# Patient Record
Sex: Male | Born: 1960 | Race: White | Hispanic: No | Marital: Single | Smoking: Former smoker
Health system: Southern US, Community
[De-identification: ages and names within clinical notes are randomized; demographics above are authoritative.]

## PROBLEM LIST (undated history)

## (undated) DIAGNOSIS — E119 Type 2 diabetes mellitus without complications: Secondary | ICD-10-CM

## (undated) DIAGNOSIS — J302 Other seasonal allergic rhinitis: Secondary | ICD-10-CM

## (undated) DIAGNOSIS — N201 Calculus of ureter: Secondary | ICD-10-CM

## (undated) DIAGNOSIS — Z87442 Personal history of urinary calculi: Secondary | ICD-10-CM

## (undated) DIAGNOSIS — K760 Fatty (change of) liver, not elsewhere classified: Secondary | ICD-10-CM

## (undated) DIAGNOSIS — E785 Hyperlipidemia, unspecified: Secondary | ICD-10-CM

## (undated) DIAGNOSIS — I10 Essential (primary) hypertension: Secondary | ICD-10-CM

## (undated) DIAGNOSIS — Z973 Presence of spectacles and contact lenses: Secondary | ICD-10-CM

## (undated) HISTORY — PX: CYSTOSCOPY WITH HOLMIUM LASER LITHOTRIPSY: SHX6639

## (undated) HISTORY — PX: OTHER SURGICAL HISTORY: SHX169

## (undated) HISTORY — DX: Personal history of urinary calculi: Z87.442

## (undated) HISTORY — PX: EXTRACORPOREAL SHOCK WAVE LITHOTRIPSY: SHX1557

## (undated) HISTORY — PX: TONSILLECTOMY: SUR1361

## (undated) HISTORY — DX: Hyperlipidemia, unspecified: E78.5

## (undated) HISTORY — PX: COLONOSCOPY WITH PROPOFOL: SHX5780

## (undated) HISTORY — DX: Other seasonal allergic rhinitis: J30.2

---

## 1997-11-22 ENCOUNTER — Ambulatory Visit (HOSPITAL_COMMUNITY): Admission: RE | Admit: 1997-11-22 | Discharge: 1997-11-22 | Payer: Self-pay | Admitting: Internal Medicine

## 1999-05-27 ENCOUNTER — Emergency Department (HOSPITAL_COMMUNITY): Admission: EM | Admit: 1999-05-27 | Discharge: 1999-05-27 | Payer: Self-pay | Admitting: Emergency Medicine

## 1999-05-27 ENCOUNTER — Encounter: Payer: Self-pay | Admitting: Emergency Medicine

## 2003-10-17 ENCOUNTER — Emergency Department (HOSPITAL_COMMUNITY): Admission: EM | Admit: 2003-10-17 | Discharge: 2003-10-17 | Payer: Self-pay | Admitting: Emergency Medicine

## 2003-10-20 ENCOUNTER — Ambulatory Visit (HOSPITAL_COMMUNITY): Admission: RE | Admit: 2003-10-20 | Discharge: 2003-10-20 | Payer: Self-pay | Admitting: Urology

## 2003-12-23 ENCOUNTER — Ambulatory Visit (HOSPITAL_COMMUNITY): Admission: RE | Admit: 2003-12-23 | Discharge: 2003-12-23 | Payer: Self-pay | Admitting: Urology

## 2003-12-23 ENCOUNTER — Ambulatory Visit (HOSPITAL_BASED_OUTPATIENT_CLINIC_OR_DEPARTMENT_OTHER): Admission: RE | Admit: 2003-12-23 | Discharge: 2003-12-23 | Payer: Self-pay | Admitting: Urology

## 2008-09-05 ENCOUNTER — Emergency Department (HOSPITAL_BASED_OUTPATIENT_CLINIC_OR_DEPARTMENT_OTHER): Admission: EM | Admit: 2008-09-05 | Discharge: 2008-09-05 | Payer: Self-pay | Admitting: Emergency Medicine

## 2009-05-01 ENCOUNTER — Ambulatory Visit: Payer: Self-pay | Admitting: Diagnostic Radiology

## 2009-05-01 ENCOUNTER — Emergency Department (HOSPITAL_BASED_OUTPATIENT_CLINIC_OR_DEPARTMENT_OTHER): Admission: EM | Admit: 2009-05-01 | Discharge: 2009-05-01 | Payer: Self-pay | Admitting: Emergency Medicine

## 2009-05-05 ENCOUNTER — Encounter: Admission: RE | Admit: 2009-05-05 | Discharge: 2009-05-05 | Payer: Self-pay | Admitting: Orthopedic Surgery

## 2009-08-17 ENCOUNTER — Encounter: Admission: RE | Admit: 2009-08-17 | Discharge: 2009-11-15 | Payer: Self-pay | Admitting: Orthopedic Surgery

## 2010-04-12 ENCOUNTER — Ambulatory Visit: Payer: Self-pay | Admitting: Diagnostic Radiology

## 2010-04-12 ENCOUNTER — Emergency Department (HOSPITAL_BASED_OUTPATIENT_CLINIC_OR_DEPARTMENT_OTHER): Admission: EM | Admit: 2010-04-12 | Discharge: 2010-04-12 | Payer: Self-pay | Admitting: Emergency Medicine

## 2010-08-14 LAB — URINE MICROSCOPIC-ADD ON

## 2010-08-14 LAB — URINALYSIS, ROUTINE W REFLEX MICROSCOPIC
Glucose, UA: NEGATIVE mg/dL
Nitrite: NEGATIVE
Protein, ur: 100 mg/dL — AB

## 2010-10-19 NOTE — Op Note (Signed)
NAME:  Kyle Mcclure, Kyle Mcclure                          ACCOUNT NO.:  [id number]   MEDICAL RECORD NO.:  [id number]                   PATIENT TYPE:  AMB   LOCATION:  [facility]                                 FACILITY:  [facility]   PHYSICIAN:  Mark C. Vernie Ammons, M.D.               DATE OF BIRTH:  10/24/1960   DATE OF PROCEDURE:  12/23/2003  DATE OF DISCHARGE:                                 OPERATIVE REPORT   PREOPERATIVE DIAGNOSES:  Left ureteral calculus.   POSTOPERATIVE DIAGNOSES:  Left ureteral calculus.   PROCEDURE:  Cystoscopy, left retrograde pyelogram with interpretation, left  ureteroscopy with laser in situ lithotripsy and stone extraction.   SURGEON:  Mark C. Vernie Ammons, M.D.   ANESTHESIA:  General.   ESTIMATED BLOOD LOSS:  Less than 5 mL.   DRAINS:  None.   SPECIMENS:  Stone to patient.   COMPLICATIONS:  None.   INDICATIONS FOR PROCEDURE:  The patient is a 50 -year-old white male who was  treated for left ureteral calculus with lithotripsy. The stone fragmented  but persisted but he had persistent fragments that failed to progress. He  was brought to the OR for ureteroscopic extraction of the stone fragments.  The risks, complications, alternatives and limitations were discussed with  the patient.   DESCRIPTION OF PROCEDURE:  After informed consent, the patient was brought  to the major OR, placed on the table, administered general anesthesia, moved  to the dorsal lithotomy position. His genitalia was sterilely prepped and  draped and a 21 French cystoscope was then passed per urethra into the  bladder. The bladder was fully inspected and noted to be free of any tumor,  stones or inflammatory lesions. The left ureteral orifice was identified and  a 0.038 inch floppy-tip guidewire was then passed up the left ureter under  fluoroscopic control.  I then removed the cystoscope leaving the guidewire  in place and inserted the 6 French rigid ureteroscope. I passed the  ureteroscope next to  the guidewire and up the left ureter for a short  distance and then injected contrast. The retrograde pyelogram revealed a  filling defect in the distal ureter consistent with the stone.  I therefore  advanced the ureteroscope further and was able to identify the stone.  It  was photographed. There appeared to be two stones present and the smallest  one was grasped with the nitinol basket and extracted without difficulty.  The larger one was felt too large to extract and therefore the holmium laser  fiber was then passed through the ureteroscope up to the level of the stone  and the stone was fragmented into two essentially equal size pieces. I then  used the nitinol basket to extract each of these. Finally the ureteroscope  was passed up the ureter. No further stone fragments were noted. There was  no injury to the ureter and due to the minimal amount of trauma  to the  ureter, I elected not to leave a ureteral stent. I then removed the  ureteroscope, reinserted the cystoscope in order to drain the bladder after  which the cystoscope was removed and  1% lidocaine jelly was then instilled in the urethra. A penile clamp was  applied and a B&O suppository given. The patient was awakened and taken to  the recovery room in stable satisfactory condition.   He will receive a prescription for 30 Vicodin ES and 30, 200 mg Pyridium and  followup in my office in two weeks.                                               Mark C. Vernie Ammons, M.D.    MCO/MEDQ  D:  12/23/2003  T:  12/24/2003  Job:  [id number]

## 2012-09-05 ENCOUNTER — Other Ambulatory Visit: Payer: Self-pay | Admitting: Internal Medicine

## 2012-09-05 ENCOUNTER — Other Ambulatory Visit (HOSPITAL_BASED_OUTPATIENT_CLINIC_OR_DEPARTMENT_OTHER): Payer: Self-pay | Admitting: Internal Medicine

## 2012-10-19 ENCOUNTER — Other Ambulatory Visit (HOSPITAL_BASED_OUTPATIENT_CLINIC_OR_DEPARTMENT_OTHER): Payer: Self-pay | Admitting: Internal Medicine

## 2012-10-29 ENCOUNTER — Emergency Department (HOSPITAL_BASED_OUTPATIENT_CLINIC_OR_DEPARTMENT_OTHER): Payer: BC Managed Care – PPO

## 2012-10-29 ENCOUNTER — Encounter (HOSPITAL_BASED_OUTPATIENT_CLINIC_OR_DEPARTMENT_OTHER): Payer: Self-pay | Admitting: Emergency Medicine

## 2012-10-29 ENCOUNTER — Emergency Department (HOSPITAL_BASED_OUTPATIENT_CLINIC_OR_DEPARTMENT_OTHER)
Admission: EM | Admit: 2012-10-29 | Discharge: 2012-10-29 | Disposition: A | Discharge status: Home / Self Care | Payer: BC Managed Care – PPO | Attending: Urology | Admitting: Urology

## 2012-10-29 ENCOUNTER — Other Ambulatory Visit: Payer: Self-pay | Admitting: Urology

## 2012-10-29 DIAGNOSIS — I10 Essential (primary) hypertension: Secondary | ICD-10-CM | POA: Insufficient documentation

## 2012-10-29 DIAGNOSIS — R112 Nausea with vomiting, unspecified: Secondary | ICD-10-CM | POA: Insufficient documentation

## 2012-10-29 DIAGNOSIS — Z79899 Other long term (current) drug therapy: Secondary | ICD-10-CM | POA: Insufficient documentation

## 2012-10-29 DIAGNOSIS — E119 Type 2 diabetes mellitus without complications: Secondary | ICD-10-CM | POA: Insufficient documentation

## 2012-10-29 DIAGNOSIS — N200 Calculus of kidney: Secondary | ICD-10-CM

## 2012-10-29 HISTORY — DX: Type 2 diabetes mellitus without complications: E11.9

## 2012-10-29 HISTORY — DX: Essential (primary) hypertension: I10

## 2012-10-29 LAB — URINALYSIS, ROUTINE W REFLEX MICROSCOPIC
Glucose, UA: >1000 mg/dL — AB
Ketones, ur: 15 mg/dL — AB
Nitrite: NEGATIVE
Urobilinogen, UA: 0.2 mg/dL (ref 0.0–1.0)

## 2012-10-29 LAB — URINE MICROSCOPIC-ADD ON

## 2012-10-29 LAB — BASIC METABOLIC PANEL
CO2: 23 mEq/L (ref 19–32)
Calcium: 9.8 mg/dL (ref 8.4–10.5)
Creatinine, Ser: 1 mg/dL (ref 0.50–1.35)
GFR calc non Af Amer: 85 mL/min — ABNORMAL LOW (ref >90)
Sodium: 134 mEq/L — ABNORMAL LOW (ref 135–145)

## 2012-10-29 LAB — CBC WITH DIFFERENTIAL/PLATELET
Basophils Absolute: 0.1 10*3/uL (ref 0.0–0.1)
Basophils Relative: 1 % (ref 0–1)
Hemoglobin: 16.4 g/dL (ref 13.0–17.0)
Lymphocytes Relative: 13 % (ref 12–46)
Lymphs Abs: 1.6 10*3/uL (ref 0.7–4.0)
MCHC: 37 g/dL — ABNORMAL HIGH (ref 30.0–36.0)
MCV: 85.9 fL (ref 78.0–100.0)
Monocytes Relative: 5 % (ref 3–12)
RBC: 5.16 MIL/uL (ref 4.22–5.81)
WBC: 12.2 10*3/uL — ABNORMAL HIGH (ref 4.0–10.5)

## 2012-10-29 MED ORDER — KETOROLAC TROMETHAMINE 30 MG/ML IJ SOLN: 30.0000 mg | Freq: Once | INTRAMUSCULAR | Status: DC

## 2012-10-29 MED ORDER — ONDANSETRON HCL 4 MG/2ML IJ SOLN
4.0000 mg | Freq: Once | INTRAMUSCULAR | Status: AC
  Administered 2012-10-29: 4 mg via INTRAVENOUS
  Filled 2012-10-29: qty 2

## 2012-10-29 MED ORDER — ONDANSETRON 8 MG PO TBDP: ORAL_TABLET | ORAL | Status: DC

## 2012-10-29 MED ORDER — KETOROLAC TROMETHAMINE 30 MG/ML IJ SOLN
30.0000 mg | Freq: Once | INTRAMUSCULAR | Status: AC
  Administered 2012-10-29: 30 mg via INTRAVENOUS
  Filled 2012-10-29: qty 1

## 2012-10-29 MED ORDER — TAMSULOSIN HCL 0.4 MG PO CAPS: 0.4000 mg | ORAL_CAPSULE | Freq: Every day | ORAL | Status: DC

## 2012-10-29 MED ORDER — SODIUM CHLORIDE 0.9 % IV BOLUS (SEPSIS)
500.0000 mL | Freq: Once | INTRAVENOUS | Status: AC
  Administered 2012-10-29: 500 mL via INTRAVENOUS

## 2012-10-29 MED ORDER — MORPHINE SULFATE 4 MG/ML IJ SOLN
4.0000 mg | Freq: Once | INTRAMUSCULAR | Status: AC
  Administered 2012-10-29: 4 mg via INTRAVENOUS
  Filled 2012-10-29: qty 1

## 2012-10-29 MED ORDER — HYDROMORPHONE HCL 2 MG PO TABS: 2.0000 mg | ORAL_TABLET | Freq: Four times a day (QID) | ORAL | Status: DC | PRN

## 2012-10-29 MED ORDER — IBUPROFEN 800 MG PO TABS: 800.0000 mg | ORAL_TABLET | Freq: Three times a day (TID) | ORAL | Status: DC

## 2012-10-29 NOTE — ED Notes (Signed)
Pt c/o right flank pain and right testicular pain. Pt has hx of kidney stones. Pt states he vomited x 5 at home.

## 2012-10-29 NOTE — ED Notes (Signed)
HYQ:[id number]<IO> Expected date:<BR> Expected time:<BR> Means of arrival:<BR> Comments:<BR> Transfer from [facility] - Kidney Stone, Obstructing

## 2012-10-29 NOTE — ED Notes (Signed)
Urologist ar bedside.

## 2012-10-29 NOTE — Consult Note (Signed)
Urology Consult  Referring physician: Dr Dalhstedt[facility]/ Dr Terressa Koyanagi Reason for referral:Ureteral stone  Chief Complaint: Ureteral stone  History of Present Illness: Recurrent rt flank pain; found on CT to have moderate Rt hydro and a 1.3 x 0.9 cm stone in proximal rt ureter 6 cm below renal pelvis; non-osbt stones rt kidney also; has passed large stone before: ESWL Dr Vernie Ammons;  No fever; n/v has settoed; now pain free Modifying factors: There are no other modifying factors  Associated signs and symptoms: There are no other associated signs and symptoms Aggravating and relieving factors: There are no other aggravating or relieving factors Severity: pain free Duration: responded to pain meds in ER  Past Medical History  Diagnosis Date  . Kidney stones   . Diabetes mellitus without complication   . Hypertension    History reviewed. No pertinent past surgical history.  Medications: I have reviewed the patient's current medications. Allergies:  Allergies  Allergen Reactions  . Hydrocodone Nausea And Vomiting  . Oxycodone Nausea And Vomiting    No family history on file. Social History:  reports that he has never smoked. He does not have any smokeless tobacco history on file. He reports that  drinks alcohol. His drug history is not on file.  ROS: All systems are reviewed and negative except as noted. Rest ROS negative  Physical Exam:  Vital signs in last 24 hours: Temp:  [98.2 F (36.8 C)] 98.2 F (36.8 C) (05/29 0452) Pulse Rate:  [104] 104 (05/29 0452) Resp:  [22] 22 (05/29 0452) BP: (149)/(91) 149/91 mmHg (05/29 0452) SpO2:  [98 %] 98 % (05/29 0452) Weight:  [106.595 kg (235 lb)] 106.595 kg (235 lb) (05/29 0452)  Cardiovascular: Skin warm; not flushed Respiratory: Breaths quiet; no shortness of breath Abdomen: No masses Neurological: Normal sensation to touch Musculoskeletal: Normal motor function arms and legs Lymphatics: No inguinal adenopathy Skin: No  rashes Genitourinary:non-toxic; no pain; no CVA or scrotal tender  Laboratory Data:  Results for orders placed during the hospital encounter of 10/29/12 (from the past 72 hour(s))  URINALYSIS, ROUTINE W REFLEX MICROSCOPIC     Status: Abnormal   Collection Time    10/29/12  4:46 AM      Result Value Range   Color, Urine YELLOW  YELLOW   APPearance CLEAR  CLEAR   Specific Gravity, Urine >1.046 (*) 1.005 - 1.030   pH 5.0  5.0 - 8.0   Glucose, UA >1000 (*) NEGATIVE mg/dL   Hgb urine dipstick LARGE (*) NEGATIVE   Bilirubin Urine NEGATIVE  NEGATIVE   Ketones, ur 15 (*) NEGATIVE mg/dL   Protein, ur 30 (*) NEGATIVE mg/dL   Urobilinogen, UA 0.2  0.0 - 1.0 mg/dL   Nitrite NEGATIVE  NEGATIVE   Leukocytes, UA NEGATIVE  NEGATIVE  URINE MICROSCOPIC-ADD ON     Status: Abnormal   Collection Time    10/29/12  4:46 AM      Result Value Range   Squamous Epithelial / LPF RARE  RARE   WBC, UA 0-2  <3 WBC/hpf   RBC / HPF 21-50  <3 RBC/hpf   Casts GRANULAR CAST (*) NEGATIVE  CBC WITH DIFFERENTIAL     Status: Abnormal   Collection Time    10/29/12  5:05 AM      Result Value Range   WBC 12.2 (*) 4.0 - 10.5 K/uL   RBC 5.16  4.22 - 5.81 MIL/uL   Hemoglobin 16.4  13.0 - 17.0 g/dL   HCT  44.3  39.0 - 52.0 %   MCV 85.9  78.0 - 100.0 fL   MCH 31.8  26.0 - 34.0 pg   MCHC 37.0 (*) 30.0 - 36.0 g/dL   RDW 16.1  09.6 - 04.5 %   Platelets 172  150 - 400 K/uL   Neutrophils Relative % 80 (*) 43 - 77 %   Neutro Abs 9.8 (*) 1.7 - 7.7 K/uL   Lymphocytes Relative 13  12 - 46 %   Lymphs Abs 1.6  0.7 - 4.0 K/uL   Monocytes Relative 5  3 - 12 %   Monocytes Absolute 0.7  0.1 - 1.0 K/uL   Eosinophils Relative 1  0 - 5 %   Eosinophils Absolute 0.1  0.0 - 0.7 K/uL   Basophils Relative 1  0 - 1 %   Basophils Absolute 0.1  0.0 - 0.1 K/uL  BASIC METABOLIC PANEL     Status: Abnormal   Collection Time    10/29/12  5:05 AM      Result Value Range   Sodium 134 (*) 135 - 145 mEq/L   Potassium 4.0  3.5 - 5.1 mEq/L    Chloride 95 (*) 96 - 112 mEq/L   CO2 23  19 - 32 mEq/L   Glucose, Bld 344 (*) 70 - 99 mg/dL   BUN 15  6 - 23 mg/dL   Creatinine, Ser 4.09  0.50 - 1.35 mg/dL   Calcium 9.8  8.4 - 81.1 mg/dL   GFR calc non Af Amer 85 (*) >90 mL/min   GFR calc Af Amer >90  >90 mL/min   Comment:            The eGFR has been calculated     using the CKD EPI equation.     This calculation has not been     validated in all clinical     situations.     eGFR's persistently     <90 mL/min signify     possible Chronic Kidney Disease.   No results found for this or any previous visit (from the past 240 hour(s)). Creatinine:  Recent Labs  10/29/12 0505  CREATININE 1.00    Xrays: See report/chart See above  Impression/Assessment:  RT ueteral stone; picture drawn; pros/cons/risks of temperizing stent discussed; ESWL Monday discussed; indications fo go to [facility] ER discussed  Plan:  No stent at pt's request/ Dr Haze Boyden in am; ESWL Monday; flomax/dilaudid/phernergan/strainer  Aviyah Swetz A 10/29/2012, 9:47 AM

## 2012-10-29 NOTE — ED Provider Notes (Addendum)
History     CSN: [id number]  Arrival date & time 10/29/12  4098   First MD Initiated Contact with Patient 10/29/12 [phone]      Chief Complaint  Patient presents with  . Flank Pain    (Consider location/radiation/quality/duration/timing/severity/associated sxs/prior treatment) Patient is a 52 y.o. male presenting with flank pain. The history is provided by the patient.  Flank Pain This is a recurrent problem. The current episode started 6 to 12 hours ago. The problem occurs constantly. The problem has not changed since onset.Pertinent negatives include no chest pain, no abdominal pain, no headaches and no shortness of breath. Nothing aggravates the symptoms. Nothing relieves the symptoms. He has tried nothing for the symptoms. The treatment provided no relief.    Past Medical History  Diagnosis Date  . Kidney stones   . Diabetes mellitus without complication   . Hypertension     History reviewed. No pertinent past surgical history.  No family history on file.  History  Substance Use Topics  . Smoking status: Never Smoker   . Smokeless tobacco: Not on file  . Alcohol Use: Yes      Review of Systems  Respiratory: Negative for shortness of breath.   Cardiovascular: Negative for chest pain.  Gastrointestinal: Positive for nausea and vomiting. Negative for abdominal pain.  Genitourinary: Positive for flank pain.  Neurological: Negative for headaches.  All other systems reviewed and are negative.    Allergies  Hydrocodone and Oxycodone  Home Medications   Current Outpatient Rx  Name  Route  Sig  Dispense  Refill  . lisinopril (PRINIVIL,ZESTRIL) 10 MG tablet      TAKE 1 TABLET DAILY TO CONTROL BLOOD PRESSURE   30 tablet   4   . metFORMIN (GLUCOPHAGE) 500 MG tablet      TAKE 1 TABLET TWICE A DAY FOR BLOOD SUGAR   60 tablet   5   . simvastatin (ZOCOR) 40 MG tablet      TAKE 1/4 TABLET DAILY AT NIGHT TO LOWER CHOLESTEROL   15 tablet   0     BP 149/91   Pulse 104  Temp(Src) 98.2 F (36.8 C) (Oral)  Resp 22  Ht 5\' 8"  (1.727 m)  Wt 235 lb (106.595 kg)  BMI 35.74 kg/m2  SpO2 98%  Physical Exam  Constitutional: He is oriented to person, place, and time. He appears well-developed and well-nourished. No distress.  HENT:  Head: Normocephalic and atraumatic.  Mouth/Throat: Oropharynx is clear and moist.  Eyes: Conjunctivae are normal. Pupils are equal, round, and reactive to light.  Neck: Normal range of motion. Neck supple.  Cardiovascular: Normal rate and regular rhythm.   Pulmonary/Chest: Effort normal and breath sounds normal. He has no wheezes. He has no rales.  Abdominal: Soft. Bowel sounds are normal. There is no tenderness. There is no rebound and no guarding.  Musculoskeletal: Normal range of motion.  Neurological: He is alert and oriented to person, place, and time.  Skin: Skin is warm and dry.  Psychiatric: He has a normal mood and affect.    ED Course  Procedures (including critical care time)  Labs Reviewed  URINALYSIS, ROUTINE W REFLEX MICROSCOPIC  CBC WITH DIFFERENTIAL  BASIC METABOLIC PANEL   No results found.   No diagnosis found.    MDM  Case d/w Dr. Hillis Range of urology, transfer to the [facility] ED to be seen by Dr. Sherron Monday  Dr. Dierdre Highman informed of patient transfer  Trishelle Devora Smitty Cords, MD 10/29/12 [phone]

## 2012-10-30 ENCOUNTER — Encounter (HOSPITAL_COMMUNITY): Payer: Self-pay | Admitting: Pharmacy Technician

## 2012-11-02 ENCOUNTER — Encounter (HOSPITAL_COMMUNITY): Payer: Self-pay | Admitting: *Deleted

## 2012-11-02 NOTE — Progress Notes (Signed)
Spoke to patient via phone,history obtained,updated.  Bring blue folder,insurance cards,picture ID,designated driver and living will,POA, if desires (to be placed on chart). Reinforced no aspirin(instructions to hold aspirin per your doctor), ibuprofen products naproxen 72 hours prior to procedure . No vitamins or herbal medicines 7 days prior to procedure.   Follow laxative instructions provided by urologist (office) and in blue folder. Wear easy on/off clothing and no jewelry except wedding rings and ear rings. Leave all other valuables at home. Verbalizes understanding of instructions

## 2012-11-05 ENCOUNTER — Emergency Department (HOSPITAL_COMMUNITY)
Admission: EM | Admit: 2012-11-05 | Discharge: 2012-11-05 | Disposition: A | Discharge status: Home / Self Care | Payer: BC Managed Care – PPO | Attending: Emergency Medicine | Admitting: Emergency Medicine

## 2012-11-05 ENCOUNTER — Ambulatory Visit (HOSPITAL_COMMUNITY)
Admission: RE | Admit: 2012-11-05 | Discharge: 2012-11-05 | Disposition: A | Discharge status: Home / Self Care | Payer: BC Managed Care – PPO | Source: Ambulatory Visit | Attending: Urology | Admitting: Urology

## 2012-11-05 ENCOUNTER — Ambulatory Visit (HOSPITAL_COMMUNITY): Payer: BC Managed Care – PPO

## 2012-11-05 ENCOUNTER — Encounter (HOSPITAL_COMMUNITY): Payer: Self-pay | Admitting: *Deleted

## 2012-11-05 ENCOUNTER — Encounter (HOSPITAL_COMMUNITY): Payer: Self-pay | Admitting: Emergency Medicine

## 2012-11-05 ENCOUNTER — Encounter (HOSPITAL_COMMUNITY)
Admission: RE | Disposition: A | Discharge status: Home / Self Care | Payer: Self-pay | Source: Ambulatory Visit | Attending: Urology

## 2012-11-05 DIAGNOSIS — E119 Type 2 diabetes mellitus without complications: Secondary | ICD-10-CM | POA: Insufficient documentation

## 2012-11-05 DIAGNOSIS — I10 Essential (primary) hypertension: Secondary | ICD-10-CM | POA: Insufficient documentation

## 2012-11-05 DIAGNOSIS — N201 Calculus of ureter: Secondary | ICD-10-CM | POA: Insufficient documentation

## 2012-11-05 DIAGNOSIS — G8918 Other acute postprocedural pain: Secondary | ICD-10-CM | POA: Insufficient documentation

## 2012-11-05 DIAGNOSIS — Z87442 Personal history of urinary calculi: Secondary | ICD-10-CM | POA: Insufficient documentation

## 2012-11-05 DIAGNOSIS — Z9889 Other specified postprocedural states: Secondary | ICD-10-CM | POA: Insufficient documentation

## 2012-11-05 DIAGNOSIS — Z7982 Long term (current) use of aspirin: Secondary | ICD-10-CM | POA: Insufficient documentation

## 2012-11-05 DIAGNOSIS — Z79899 Other long term (current) drug therapy: Secondary | ICD-10-CM | POA: Insufficient documentation

## 2012-11-05 DIAGNOSIS — R319 Hematuria, unspecified: Secondary | ICD-10-CM | POA: Insufficient documentation

## 2012-11-05 DIAGNOSIS — Z87891 Personal history of nicotine dependence: Secondary | ICD-10-CM | POA: Insufficient documentation

## 2012-11-05 DIAGNOSIS — R112 Nausea with vomiting, unspecified: Secondary | ICD-10-CM | POA: Insufficient documentation

## 2012-11-05 LAB — GLUCOSE, CAPILLARY: Glucose-Capillary: 265 mg/dL — ABNORMAL HIGH (ref 70–99)

## 2012-11-05 SURGERY — LITHOTRIPSY, ESWL
Anesthesia: LOCAL | Laterality: Right

## 2012-11-05 MED ORDER — SENNA-DOCUSATE SODIUM 8.6-50 MG PO TABS: 1.0000 | ORAL_TABLET | Freq: Two times a day (BID) | ORAL | Status: DC

## 2012-11-05 MED ORDER — ONDANSETRON HCL 4 MG/2ML IJ SOLN
4.0000 mg | Freq: Once | INTRAMUSCULAR | Status: AC
  Administered 2012-11-05: 4 mg via INTRAVENOUS
  Filled 2012-11-05: qty 2

## 2012-11-05 MED ORDER — CIPROFLOXACIN HCL 500 MG PO TABS
500.0000 mg | ORAL_TABLET | ORAL | Status: AC
  Administered 2012-11-05: 500 mg via ORAL
  Filled 2012-11-05: qty 1

## 2012-11-05 MED ORDER — SODIUM CHLORIDE 0.9 % IV SOLN
INTRAVENOUS | Status: DC
  Administered 2012-11-05: 06:00:00 via INTRAVENOUS

## 2012-11-05 MED ORDER — DIAZEPAM 5 MG PO TABS
10.0000 mg | ORAL_TABLET | ORAL | Status: AC
  Administered 2012-11-05: 10 mg via ORAL
  Filled 2012-11-05: qty 2

## 2012-11-05 MED ORDER — ONDANSETRON 4 MG PO TBDP: 4.0000 mg | ORAL_TABLET | Freq: Three times a day (TID) | ORAL | Status: DC | PRN

## 2012-11-05 MED ORDER — HYDROMORPHONE HCL PF 1 MG/ML IJ SOLN
1.0000 mg | Freq: Once | INTRAMUSCULAR | Status: AC
  Administered 2012-11-05: 1 mg via INTRAVENOUS
  Filled 2012-11-05: qty 1

## 2012-11-05 MED ORDER — SODIUM CHLORIDE 0.9 % IV SOLN: INTRAVENOUS | Status: DC

## 2012-11-05 MED ORDER — LORAZEPAM 2 MG/ML IJ SOLN
1.0000 mg | Freq: Once | INTRAMUSCULAR | Status: AC
  Administered 2012-11-05: 1 mg via INTRAVENOUS
  Filled 2012-11-05: qty 1

## 2012-11-05 MED ORDER — HYDROMORPHONE HCL 2 MG PO TABS: 2.0000 mg | ORAL_TABLET | ORAL | Status: DC | PRN

## 2012-11-05 MED ORDER — DIPHENHYDRAMINE HCL 25 MG PO CAPS
25.0000 mg | ORAL_CAPSULE | ORAL | Status: AC
  Administered 2012-11-05: 25 mg via ORAL
  Filled 2012-11-05: qty 1

## 2012-11-05 MED ORDER — SODIUM CHLORIDE 0.9 % IV BOLUS (SEPSIS)
1000.0000 mL | Freq: Once | INTRAVENOUS | Status: AC
  Administered 2012-11-05: 1000 mL via INTRAVENOUS

## 2012-11-05 NOTE — Progress Notes (Signed)
Pt c/o nausea, Dr. Berneice Heinrich notified.  New orders given.  See [facility]

## 2012-11-05 NOTE — ED Notes (Signed)
Pt had lipotripsy done this am for  A 9mm stone and went home and had n/v with uncontrolled rt flank pain,

## 2012-11-05 NOTE — H&P (Signed)
Kyle Mcclure is an 52 y.o. male.    Chief Complaint: Pre-OP Rt Shockwave Lithotripsy  HPI:   1 - Rt Ureteral Stone - Pt with Rt 1.3 x 0.9 cm mid ureteral stone at L4-L5 level, 1100HU by CT in ER 5/29 on w/u colicky Rt flank pain. Has followed with Dr. Vernie Ammons for some time for complex metabolic stone disease. Prior compositions included Urate and Ca-Ox.  After discussion with Dr. Sherron Monday (saw pt initially on call) and Dr. Vernie Ammons, he has opted for Rt shockwave lithotripsy as primary therapy. No interval stone passage. Stone easily seen on KUB today. No interval fevers. Most recent UA withtou infectious parameters.   Past Medical History  Diagnosis Date  . Kidney stones   . Diabetes mellitus without complication   . Hypertension     Past Surgical History  Procedure Laterality Date  . Wisdomteeth extraction    . Tonsillectomy      No family history on file. Social History:  reports that he has quit smoking. He does not have any smokeless tobacco history on file. He reports that he drinks about 1.5 ounces of alcohol per week. He reports that he does not use illicit drugs.  Allergies:  Allergies  Allergen Reactions  . Hydrocodone Nausea And Vomiting  . Oxycodone Nausea And Vomiting    Medications Prior to Admission  Medication Sig Dispense Refill  . aspirin 81 MG tablet Take 81 mg by mouth daily.      . cetirizine (ZYRTEC) 10 MG tablet Take 10 mg by mouth daily.      Marland Kitchen HYDROmorphone (DILAUDID) 2 MG tablet Take 2 mg by mouth every 4 (four) hours as needed for pain.      Marland Kitchen lisinopril (PRINIVIL,ZESTRIL) 10 MG tablet Take 10 mg by mouth every morning.      . metFORMIN (GLUCOPHAGE) 500 MG tablet Take 500 mg by mouth 2 (two) times daily with a meal.      . naproxen sodium (ANAPROX) 220 MG tablet Take 220 mg by mouth 2 (two) times daily with a meal.      . potassium citrate (UROCIT-K 10) 10 MEQ (1080 MG) SR tablet Take 10 mEq by mouth 2 (two) times daily.      . promethazine  (PHENERGAN) 25 MG tablet Take 25 mg by mouth every 8 (eight) hours as needed for nausea.      . tamsulosin (FLOMAX) 0.4 MG CAPS Take 0.4 mg by mouth daily.        No results found for this or any previous visit (from the past 48 hour(s)). No results found.  Review of Systems  Constitutional: Negative for fever and chills.  Eyes: Negative.   Cardiovascular: Negative.   Gastrointestinal: Negative.   Genitourinary: Positive for flank pain.  Musculoskeletal: Negative.   Skin: Negative.   Neurological: Negative.   Endo/Heme/Allergies: Negative.   Psychiatric/Behavioral: Negative.     Blood pressure 120/87, pulse 99, temperature 98.5 F (36.9 C), temperature source Oral, resp. rate 18, height 5\' 8"  (1.727 m), weight 105.773 kg (233 lb 3 oz), SpO2 96.00%. Physical Exam  Constitutional: He is oriented to person, place, and time. He appears well-developed and well-nourished.  HENT:  Head: Normocephalic and atraumatic.  Eyes: EOM are normal. Pupils are equal, round, and reactive to light.  Neck: Normal range of motion.  Cardiovascular: Normal rate.   Respiratory: Effort normal.  GI: Soft. Bowel sounds are normal.  Genitourinary: Penis normal.  Mild Rt CVAT  Musculoskeletal: Normal range  of motion.  Neurological: He is alert and oriented to person, place, and time.  Skin: Skin is warm and dry.  Psychiatric: He has a normal mood and affect. His behavior is normal. Judgment and thought content normal.     Assessment/Plan  1 - Rt Ureteral Stone - We discussed shockwave lithotripsy in detail as well as my "rule of 9s" with stones <46mm, less than 900 HU, and skin to stone distance <9cm having approximately 90% treatment success with single session of treatment. We then addressed how stones that are larger, more dense, and in patients with less favorable anatomy have incrementally decreased success rates. We discussed risks including, bleeding, infection, hematoma, loss of kidney, need for  staged therapy, need for adjunctive therapy and requirement to refrain from any anticoagulants, anti-platelet or aspirin-like products peri-procedureally. After careful consideration, the patient has chosen to proceed.   Shaquanda Graves 11/05/2012, 6:12 AM

## 2012-11-05 NOTE — Progress Notes (Signed)
[facility] ED CM noted no pcp Cm spoke with Gerri at [facility] to confirm pcp is Dr Bufford Spikes EPIC updated

## 2012-11-05 NOTE — ED Provider Notes (Signed)
History     CSN: [id number]  Arrival date & time 11/05/12  1325   First MD Initiated Contact with Patient 11/05/12 1352      Chief Complaint  Patient presents with  . Flank Pain  . Abdominal Pain  . Nausea  . Emesis    (Consider location/radiation/quality/duration/timing/severity/associated sxs/prior treatment) Patient is a 52 y.o. male presenting with flank pain, abdominal pain, and vomiting. The history is provided by the patient.  Flank Pain Associated symptoms include abdominal pain.  Abdominal Pain Associated symptoms include abdominal pain.  Emesis Associated symptoms: abdominal pain    patient here complaining of right-sided flank pain with nausea and vomiting that began after he had lithotripsy today. Has had some hematuria but does still urinate appropriately. Did take his Dilaudid-HP but that did not help. No fever or chills. Pain has been persistent and nothing makes it worse  Past Medical History  Diagnosis Date  . Kidney stones   . Diabetes mellitus without complication   . Hypertension     Past Surgical History  Procedure Laterality Date  . Wisdomteeth extraction    . Tonsillectomy      No family history on file.  History  Substance Use Topics  . Smoking status: Former Games developer  . Smokeless tobacco: Not on file  . Alcohol Use: 1.5 oz/week    3 drink(s) per week      Review of Systems  Gastrointestinal: Positive for vomiting and abdominal pain.  Genitourinary: Positive for flank pain.  All other systems reviewed and are negative.    Allergies  Hydrocodone and Oxycodone  Home Medications   Current Outpatient Rx  Name  Route  Sig  Dispense  Refill  . aspirin 81 MG tablet   Oral   Take 81 mg by mouth daily.         . cetirizine (ZYRTEC) 10 MG tablet   Oral   Take 10 mg by mouth daily.         Marland Kitchen HYDROmorphone (DILAUDID) 2 MG tablet   Oral   Take 2 mg by mouth every 4 (four) hours as needed for pain.         Marland Kitchen lisinopril  (PRINIVIL,ZESTRIL) 10 MG tablet   Oral   Take 10 mg by mouth every morning.         . metFORMIN (GLUCOPHAGE) 500 MG tablet   Oral   Take 500 mg by mouth 2 (two) times daily with a meal.         . naproxen sodium (ANAPROX) 220 MG tablet   Oral   Take 220 mg by mouth 2 (two) times daily with a meal.         . ondansetron (ZOFRAN ODT) 4 MG disintegrating tablet   Oral   Take 1 tablet (4 mg total) by mouth every 8 (eight) hours as needed for nausea.   20 tablet   0   . potassium citrate (UROCIT-K 10) 10 MEQ (1080 MG) SR tablet   Oral   Take 10 mEq by mouth 2 (two) times daily.         . promethazine (PHENERGAN) 25 MG tablet   Oral   Take 25 mg by mouth every 8 (eight) hours as needed for nausea.         . tamsulosin (FLOMAX) 0.4 MG CAPS   Oral   Take 0.4 mg by mouth daily.         . sennosides-docusate sodium (SENOKOT-S) 8.6-50 MG tablet  Oral   Take 1 tablet by mouth 2 (two) times daily. While taking pain meds to prevent constipation   30 tablet   1     BP 141/79  Pulse 70  Temp(Src) 97.7 F (36.5 C) (Oral)  Resp 16  SpO2 100%  Physical Exam  Nursing note and vitals reviewed. Constitutional: He is oriented to person, place, and time. He appears well-developed and well-nourished.  Non-toxic appearance. No distress.  HENT:  Head: Normocephalic and atraumatic.  Eyes: Conjunctivae, EOM and lids are normal. Pupils are equal, round, and reactive to light.  Neck: Normal range of motion. Neck supple. No tracheal deviation present. No mass present.  Cardiovascular: Normal rate, regular rhythm and normal heart sounds.  Exam reveals no gallop.   No murmur heard. Pulmonary/Chest: Effort normal and breath sounds normal. No stridor. No respiratory distress. He has no decreased breath sounds. He has no wheezes. He has no rhonchi. He has no rales.  Abdominal: Soft. Normal appearance and bowel sounds are normal. He exhibits no distension. There is no tenderness. There  is no rebound and no CVA tenderness.  Musculoskeletal: Normal range of motion. He exhibits no edema and no tenderness.       Arms: Neurological: He is alert and oriented to person, place, and time. He has normal strength. No cranial nerve deficit or sensory deficit. GCS eye subscore is 4. GCS verbal subscore is 5. GCS motor subscore is 6.  Skin: Skin is warm and dry. No abrasion and no rash noted.  Psychiatric: He has a normal mood and affect. His speech is normal and behavior is normal.    ED Course  Procedures (including critical care time)  Labs Reviewed - No data to display Dg Abd 1 View  11/05/2012   *RADIOLOGY REPORT*  Clinical Data: Pre lithotripsy radiograph; right ureteral stone.  ABDOMEN - 1 VIEW  Comparison: CT of the abdomen and pelvis performed 10/29/2012  Findings: A 1.3 cm right ureteral stone is again noted, projecting adjacent to the right transverse process of L4 along the mid right ureter.  Nonobstructing right renal stones are also seen.  The visualized bowel gas pattern is unremarkable.  No acute osseous abnormalities are identified.  IMPRESSION:  1.  1.3 cm right ureteral stone again noted, projecting adjacent to the right transverse process of L4 along the mid right ureter. 2.  Nonobstructing right renal stones also seen.   Original Report Authenticated By: Tonia Ghent, M.D.     No diagnosis found.    MDM  Spoke with dr. Berneice Heinrich, agrees with plan to give iv fluids and observe   3:17 PM Pt rechecked and feels better--stable for d/c       Toy Baker, MD 11/05/12 [phone]

## 2012-11-05 NOTE — Progress Notes (Signed)
Called Dr. Berneice Heinrich as patient only had a few Flomax tablets left at home. He has called in a script to patient's pharmacy for him to continue. Discharge order obtained from Dr. Berneice Heinrich.

## 2012-12-01 HISTORY — PX: LITHOTRIPSY: SUR834

## 2013-04-08 ENCOUNTER — Other Ambulatory Visit: Payer: Self-pay | Admitting: Internal Medicine

## 2013-06-08 ENCOUNTER — Other Ambulatory Visit: Payer: Self-pay | Admitting: Internal Medicine

## 2013-06-10 ENCOUNTER — Other Ambulatory Visit: Payer: Self-pay | Admitting: *Deleted

## 2013-06-10 MED ORDER — LISINOPRIL 10 MG PO TABS
ORAL_TABLET | ORAL | Status: DC
Start: 2013-06-10 — End: 2013-06-15

## 2013-06-15 ENCOUNTER — Ambulatory Visit (INDEPENDENT_AMBULATORY_CARE_PROVIDER_SITE_OTHER): Payer: 59 | Admitting: Internal Medicine

## 2013-06-15 ENCOUNTER — Encounter: Payer: Self-pay | Admitting: Internal Medicine

## 2013-06-15 VITALS — BP 136/80 | HR 73 | Temp 98.4°F | Wt 235.2 lb

## 2013-06-15 DIAGNOSIS — IMO0001 Reserved for inherently not codable concepts without codable children: Secondary | ICD-10-CM | POA: Insufficient documentation

## 2013-06-15 DIAGNOSIS — J019 Acute sinusitis, unspecified: Secondary | ICD-10-CM | POA: Insufficient documentation

## 2013-06-15 DIAGNOSIS — J309 Allergic rhinitis, unspecified: Secondary | ICD-10-CM | POA: Insufficient documentation

## 2013-06-15 DIAGNOSIS — E039 Hypothyroidism, unspecified: Secondary | ICD-10-CM | POA: Insufficient documentation

## 2013-06-15 DIAGNOSIS — N400 Enlarged prostate without lower urinary tract symptoms: Secondary | ICD-10-CM | POA: Insufficient documentation

## 2013-06-15 DIAGNOSIS — E785 Hyperlipidemia, unspecified: Secondary | ICD-10-CM | POA: Insufficient documentation

## 2013-06-15 DIAGNOSIS — E876 Hypokalemia: Secondary | ICD-10-CM | POA: Insufficient documentation

## 2013-06-15 DIAGNOSIS — E1165 Type 2 diabetes mellitus with hyperglycemia: Principal | ICD-10-CM

## 2013-06-15 DIAGNOSIS — I10 Essential (primary) hypertension: Secondary | ICD-10-CM

## 2013-06-15 MED ORDER — METFORMIN HCL 500 MG PO TABS: 500.0000 mg | ORAL_TABLET | Freq: Two times a day (BID) | ORAL | Status: DC

## 2013-06-15 MED ORDER — AMOXICILLIN-POT CLAVULANATE 875-125 MG PO TABS: 1.0000 | ORAL_TABLET | Freq: Two times a day (BID) | ORAL | Status: DC

## 2013-06-15 MED ORDER — LISINOPRIL 10 MG PO TABS: ORAL_TABLET | ORAL | Status: DC

## 2013-06-15 MED ORDER — FLUTICASONE PROPIONATE 50 MCG/ACT NA SUSP: 1.0000 | Freq: Every day | NASAL | Status: DC

## 2013-06-15 NOTE — Progress Notes (Signed)
Patient ID: Kyle Mcclure, male   DOB: 1960/11/23, 53 y.o.   MRN: [id number]    Allergies  Allergen Reactions  . Hydrocodone Nausea And Vomiting  . Oxycodone Nausea And Vomiting    Chief Complaint  Patient presents with  . Medical Managment of Chronic Issues    f/u & medication refills  . other    sinus pressure/drainage x weeks, has used Flonase & Nasal Rinse with little relief.   never had a colonoscopy   HPI 53 y/o male patient is seen here today for routine follow up. He is being seen after more than a year. He was seeing Dr Leanord Hawkingobson before and was lost to follow up.  He has hx of uncontrolled DM, hyperlipidemia, HTN among others He had a URI few weeks back and now has sinus pressure, green nasal drainage and discomfort at back of his throat. Denies any fever but has sinus pressure Has not been checking his blood sugar lately. Last cbg check was 2 weeks back and was 168 fasting Mentions being compliant with his meds No recent blood work  Review of Systems  Constitutional: Negative for fever, chills, weight loss, malaise/fatigue and diaphoresis.  HENT: Negative for hearing loss and sore throat.   Eyes: Negative for blurred vision, double vision and discharge. has not seen his eye doctor recently Respiratory: Negative for cough, sputum production, shortness of breath and wheezing.   Cardiovascular: Negative for chest pain, palpitations, orthopnea and leg swelling.  Gastrointestinal: Negative for heartburn, nausea, vomiting, abdominal pain, diarrhea and constipation.  Genitourinary: Negative for dysuria, urgency, frequency and flank pain. sees urology for his BPH and kidney stones Musculoskeletal: Negative for back pain, falls, joint pain and myalgias.  Skin: Negative for itching and rash.  Neurological: Negative for dizziness, tingling, focal weakness and headaches.  Psychiatric/Behavioral: Negative for depression and memory loss. The patient is not nervous/anxious.    Past  Medical History  Diagnosis Date  . Kidney stones   . Diabetes mellitus without complication   . Hypertension    Past Surgical History  Procedure Laterality Date  . Wisdomteeth extraction    . Tonsillectomy    . Lithotripsy  12/2012   Current Outpatient Prescriptions on File Prior to Visit  Medication Sig Dispense Refill  . aspirin 81 MG tablet Take 81 mg by mouth daily.      . cetirizine (ZYRTEC) 10 MG tablet Take 10 mg by mouth daily.      . naproxen sodium (ANAPROX) 220 MG tablet Take 220 mg by mouth 2 (two) times daily with a meal.      . potassium citrate (UROCIT-K 10) 10 MEQ (1080 MG) SR tablet Take 10 mEq by mouth 2 (two) times daily.       No current facility-administered medications on file prior to visit.    Physical exam BP 136/80  Pulse 73  Temp(Src) 98.4 F (36.9 C) (Oral)  Wt 235 lb 3.2 oz (106.686 kg)  SpO2 97%  General- adult male in no acute distress Head- atraumatic, normocephalic, has frontal sinus pressure and tenderness Eyes- PERRLA, EOMI, no pallor, no icterus, no discharge Neck- no lymphadenopathy, no thyromegaly, no jugular vein distension, no carotid bruit Ears- left ear normal tympanic membrane and normal external ear canal , right ear normal tympanic membrane and normal external ear canal Chest- no chest wall deformities, no chest wall tenderness Cardiovascular- normal s1,s2, no murmurs/ rubs/ gallops Respiratory- bilateral clear to auscultation, no wheeze, no rhonchi, no crackles Abdomen- bowel sounds  present, soft, non tender, no CVA tenderness Musculoskeletal- able to move all 4 extremities, no spinal and paraspinal tenderness, steady gait, no use of assistive device Neurological- no focal deficit, normal reflexes, normal muscle strength Psychiatry- alert and oriented to person, place and time, normal mood and affect  Labs- 08/06/12 glu 255, bun 11, cr 0.91, na 133, k 4.3, ca 9.5, alp 118, ast 73, alt 86, a1c 10, t.chol 230, tg 656, hdl  19  Assessment/plan 1. Unspecified hypothyroidism No recent tsh. Check the tsh level. Off all meds currently - TSH  2. Type II or unspecified type diabetes mellitus without mention of complication, uncontrolled  - Hemoglobin A1c - last a1c s/o uncontrolled DM. Pt to monitor cbg on daily basis. Review a1c and consider need for insulin. Explained med complaince, lab follow up. Will review this and order urine microalbumin, eye exam. To have detailed foot exam next visit - dietary and exercise counselling provided - continue lisinopril - continue baby asa Not on any statin  3. Other and unspecified hyperlipidemia Not on any lipid lowering agent. Recheck lipid panel today - Lipid Panel  4. Hypopotassemia Check bmp. Continue kcl supplement for now - CBC with Differential  5. Unspecified essential hypertension Stable bp reading this visit. Continue lisinopril for now - CMP - Lipid Panel  6. BPH (benign prostatic hyperplasia) Continue f/u with urology  7. Allergic rhinitis Continue flonase and cetirizine  8. Acute sinusitis Will have him on augmentin x 10 days. Reassess if no improvement

## 2013-06-16 LAB — CBC WITH DIFFERENTIAL/PLATELET
BASOS ABS: 0.1 10*3/uL (ref 0.0–0.2)
Basos: 1 %
EOS: 3 %
Eosinophils Absolute: 0.2 10*3/uL (ref 0.0–0.4)
HCT: 44.8 % (ref 37.5–51.0)
HEMOGLOBIN: 15.3 g/dL (ref 12.6–17.7)
IMMATURE GRANS (ABS): 0 10*3/uL (ref 0.0–0.1)
IMMATURE GRANULOCYTES: 0 %
Lymphocytes Absolute: 2.4 10*3/uL (ref 0.7–3.1)
Lymphs: 30 %
MCH: 30.9 pg (ref 26.6–33.0)
MCHC: 34.2 g/dL (ref 31.5–35.7)
MCV: 91 fL (ref 79–97)
MONOCYTES: 5 %
MONOS ABS: 0.4 10*3/uL (ref 0.1–0.9)
NEUTROS PCT: 61 %
Neutrophils Absolute: 4.8 10*3/uL (ref 1.4–7.0)
RBC: 4.95 x10E6/uL (ref 4.14–5.80)
RDW: 12.6 % (ref 12.3–15.4)
WBC: 7.9 10*3/uL (ref 3.4–10.8)

## 2013-06-16 LAB — LIPID PANEL
CHOLESTEROL TOTAL: 236 mg/dL — AB (ref 100–199)
Chol/HDL Ratio: 13.1 ratio units — ABNORMAL HIGH (ref 0.0–5.0)
HDL: 18 mg/dL — ABNORMAL LOW (ref >39)
TRIGLYCERIDES: 935 mg/dL — AB (ref 0–149)

## 2013-06-16 LAB — COMPREHENSIVE METABOLIC PANEL
ALK PHOS: 110 IU/L (ref 39–117)
ALT: 55 IU/L — AB (ref 0–44)
AST: 37 IU/L (ref 0–40)
Albumin/Globulin Ratio: 2.1 (ref 1.1–2.5)
Albumin: 4.6 g/dL (ref 3.5–5.5)
BILIRUBIN TOTAL: 0.6 mg/dL (ref 0.0–1.2)
BUN / CREAT RATIO: 17 (ref 9–20)
BUN: 14 mg/dL (ref 6–24)
CHLORIDE: 98 mmol/L (ref 97–108)
CO2: 19 mmol/L (ref 18–29)
Calcium: 9.7 mg/dL (ref 8.7–10.2)
Creatinine, Ser: 0.84 mg/dL (ref 0.76–1.27)
GFR calc non Af Amer: 101 mL/min/{1.73_m2} (ref >59)
GFR, EST AFRICAN AMERICAN: 116 mL/min/{1.73_m2} (ref >59)
Globulin, Total: 2.2 g/dL (ref 1.5–4.5)
Glucose: 302 mg/dL — ABNORMAL HIGH (ref 65–99)
POTASSIUM: 4.8 mmol/L (ref 3.5–5.2)
SODIUM: 138 mmol/L (ref 134–144)
Total Protein: 6.8 g/dL (ref 6.0–8.5)

## 2013-06-16 LAB — HEMOGLOBIN A1C
Est. average glucose Bld gHb Est-mCnc: 278 mg/dL
Hgb A1c MFr Bld: 11.3 % — ABNORMAL HIGH (ref 4.8–5.6)

## 2013-06-16 LAB — TSH: TSH: 1.07 u[IU]/mL (ref 0.450–4.500)

## 2013-06-23 ENCOUNTER — Encounter: Payer: Self-pay | Admitting: Internal Medicine

## 2013-06-23 ENCOUNTER — Ambulatory Visit (INDEPENDENT_AMBULATORY_CARE_PROVIDER_SITE_OTHER): Payer: 59 | Admitting: Internal Medicine

## 2013-06-23 VITALS — BP 116/80 | HR 96 | Resp 10 | Wt 235.0 lb

## 2013-06-23 DIAGNOSIS — E876 Hypokalemia: Secondary | ICD-10-CM

## 2013-06-23 DIAGNOSIS — IMO0001 Reserved for inherently not codable concepts without codable children: Secondary | ICD-10-CM

## 2013-06-23 DIAGNOSIS — Z23 Encounter for immunization: Secondary | ICD-10-CM

## 2013-06-23 DIAGNOSIS — E669 Obesity, unspecified: Secondary | ICD-10-CM

## 2013-06-23 DIAGNOSIS — E66812 Obesity, class 2: Secondary | ICD-10-CM | POA: Insufficient documentation

## 2013-06-23 DIAGNOSIS — E1165 Type 2 diabetes mellitus with hyperglycemia: Secondary | ICD-10-CM

## 2013-06-23 DIAGNOSIS — E785 Hyperlipidemia, unspecified: Secondary | ICD-10-CM

## 2013-06-23 DIAGNOSIS — I10 Essential (primary) hypertension: Secondary | ICD-10-CM

## 2013-06-23 MED ORDER — GLUCOSE BLOOD VI STRP: ORAL_STRIP | Status: DC

## 2013-06-23 MED ORDER — AMOXICILLIN-POT CLAVULANATE ER 1000-62.5 MG PO TB12: 2.0000 | ORAL_TABLET | Freq: Two times a day (BID) | ORAL | Status: DC

## 2013-06-23 MED ORDER — EZETIMIBE 10 MG PO TABS: 10.0000 mg | ORAL_TABLET | Freq: Every day | ORAL | Status: DC

## 2013-06-23 MED ORDER — GLIPIZIDE 5 MG PO TABS: 5.0000 mg | ORAL_TABLET | Freq: Two times a day (BID) | ORAL | Status: DC

## 2013-06-23 MED ORDER — METFORMIN HCL 500 MG PO TABS: 1000.0000 mg | ORAL_TABLET | Freq: Two times a day (BID) | ORAL | Status: DC

## 2013-06-23 NOTE — Progress Notes (Signed)
Patient ID: Kyle Mcclure, male   DOB: 01-Feb-1961, 53 y.o.   MRN: [id number]    Chief Complaint  Patient presents with  . Follow-up    Discuss labs    Allergies  Allergen Reactions  . Hydrocodone Nausea And Vomiting  . Oxycodone Nausea And Vomiting   HPI 53 y/o male pt is here for lab follow up. He has history of diabetes and HTN. He had blood work last week which showed uncontrolled dm and high cholesterol. He has not been seen in healthcare for almost 2 years. Reviewed his lab result with him. He was being treated for sinus infection but his symptom persist. He has not been checking his sugar recently Denies polyuria and polydypsia  Review of Systems  Constitutional: Negative for fever, chills, weight loss, malaise/fatigue and diaphoresis.  HENT: Negative for hearing loss and sore throat.   Eyes: Negative for blurred vision, double vision and discharge.  Respiratory: Negative for cough, sputum production, shortness of breath and wheezing.   Cardiovascular: Negative for chest pain, palpitations, orthopnea and leg swelling.  Gastrointestinal: Negative for heartburn, nausea, vomiting, abdominal pain, diarrhea and constipation.  Genitourinary: Negative for dysuria, urgency, frequency and flank pain.  Musculoskeletal: Negative for back pain, falls, joint pain and myalgias.  Skin: Negative for itching and rash.  Neurological: Negative for dizziness, tingling, focal weakness and headaches.  Psychiatric/Behavioral: Negative for depression and memory loss. The patient is not nervous/anxious.    Past Medical History  Diagnosis Date  . Kidney stones   . Diabetes mellitus without complication   . Hypertension    Current Outpatient Prescriptions on File Prior to Visit  Medication Sig Dispense Refill  . aspirin 81 MG tablet Take 81 mg by mouth daily.      . cetirizine (ZYRTEC) 10 MG tablet Take 10 mg by mouth daily.      . fluticasone (FLONASE) 50 MCG/ACT nasal spray Place 1 spray into  both nostrils daily.  16 g  1  . lisinopril (PRINIVIL,ZESTRIL) 10 MG tablet Take one tablet by mouth once daily to control blood pressure  30 tablet  3  . naproxen sodium (ANAPROX) 220 MG tablet Take 220 mg by mouth 2 (two) times daily with a meal.      . potassium citrate (UROCIT-K 10) 10 MEQ (1080 MG) SR tablet Take 10 mEq by mouth 2 (two) times daily.       No current facility-administered medications on file prior to visit.    Labs- CBC    Component Value Date/Time   WBC 7.9 06/15/2013 1207   WBC 12.2* 10/29/2012 0505   RBC 4.95 06/15/2013 1207   RBC 5.16 10/29/2012 0505   HGB 15.3 06/15/2013 1207   HCT 44.8 06/15/2013 1207   PLT 172 10/29/2012 0505   MCV 91 06/15/2013 1207   MCH 30.9 06/15/2013 1207   MCH 31.8 10/29/2012 0505   MCHC 34.2 06/15/2013 1207   MCHC 37.0* 10/29/2012 0505   RDW 12.6 06/15/2013 1207   RDW 11.5 10/29/2012 0505   LYMPHSABS 2.4 06/15/2013 1207   LYMPHSABS 1.6 10/29/2012 0505   MONOABS 0.7 10/29/2012 0505   EOSABS 0.2 06/15/2013 1207   EOSABS 0.1 10/29/2012 0505   BASOSABS 0.1 06/15/2013 1207   BASOSABS 0.1 10/29/2012 0505   CMP     Component Value Date/Time   NA 138 06/15/2013 1207   NA 134* 10/29/2012 0505   K 4.8 06/15/2013 1207   CL 98 06/15/2013 1207   CO2 19 06/15/2013  1207   GLUCOSE 302* 06/15/2013 1207   GLUCOSE 344* 10/29/2012 0505   BUN 14 06/15/2013 1207   BUN 15 10/29/2012 0505   CREATININE 0.84 06/15/2013 1207   CALCIUM 9.7 06/15/2013 1207   PROT 6.8 06/15/2013 1207   AST 37 06/15/2013 1207   ALT 55* 06/15/2013 1207   ALKPHOS 110 06/15/2013 1207   BILITOT 0.6 06/15/2013 1207   GFRNONAA 101 06/15/2013 1207   GFRAA 116 06/15/2013 1207   Lab Results  Component Value Date   HGBA1C 11.3* 06/15/2013   Lipid Panel     Component Value Date/Time   TRIG 935* 06/15/2013 1207   HDL 18* 06/15/2013 1207   CHOLHDL 13.1* 06/15/2013 1207   LDLCALC Comment 06/15/2013 1207   Assessment/plan  1. Unspecified essential hypertension Continue lisinopril 10 mg daily. Dietary  modification encouraged - CMP; Future  2. Type II or unspecified type diabetes mellitus without mention of complication, uncontrolled Will change metformin to 1000 mg bid, add glipizide 5 mg bid. Monitor cbg twice a day. Continue aspirin and lisinopril. To follow on urine microalbumin in urology office. Normal foot exam and renal function. uptodate with influenza vaccine. Will give pneumococcal vaccine - Hemoglobin A1c; Future - Pneumococcal conjugate vaccine 13-valent  3. Other and unspecified hyperlipidemia Did not tolerate statin in past. Will have him on ezetemibe 10 mg daily - CK; Future  4. Obesity, mild Encouraged exercise on daily basis and calorie count with dietary intake - CMP; Future  5. Need for prophylactic vaccination with Streptococcus pneumoniae (Pneumococcus) and Influenza vaccines - Pneumococcal conjugate vaccine 13-valent

## 2013-06-24 ENCOUNTER — Encounter: Payer: Self-pay | Admitting: *Deleted

## 2013-07-01 ENCOUNTER — Encounter: Payer: Self-pay | Admitting: Internal Medicine

## 2013-07-11 ENCOUNTER — Other Ambulatory Visit: Payer: Self-pay | Admitting: Nurse Practitioner

## 2013-07-14 ENCOUNTER — Ambulatory Visit: Payer: Self-pay | Admitting: Internal Medicine

## 2013-08-09 ENCOUNTER — Other Ambulatory Visit: Payer: Self-pay | Admitting: Nurse Practitioner

## 2013-09-06 ENCOUNTER — Other Ambulatory Visit: Payer: Self-pay | Admitting: Internal Medicine

## 2013-09-17 ENCOUNTER — Other Ambulatory Visit: Payer: Self-pay | Admitting: *Deleted

## 2013-09-17 ENCOUNTER — Other Ambulatory Visit: Payer: 59

## 2013-09-17 DIAGNOSIS — E119 Type 2 diabetes mellitus without complications: Secondary | ICD-10-CM

## 2013-09-17 DIAGNOSIS — I10 Essential (primary) hypertension: Secondary | ICD-10-CM

## 2013-09-17 DIAGNOSIS — E785 Hyperlipidemia, unspecified: Secondary | ICD-10-CM

## 2013-09-18 LAB — COMPREHENSIVE METABOLIC PANEL
A/G RATIO: 2 (ref 1.1–2.5)
ALBUMIN: 4.6 g/dL (ref 3.5–5.5)
ALK PHOS: 85 IU/L (ref 39–117)
ALT: 56 IU/L — ABNORMAL HIGH (ref 0–44)
AST: 48 IU/L — ABNORMAL HIGH (ref 0–40)
BUN / CREAT RATIO: 18 (ref 9–20)
BUN: 15 mg/dL (ref 6–24)
CO2: 22 mmol/L (ref 18–29)
CREATININE: 0.84 mg/dL (ref 0.76–1.27)
Calcium: 9.2 mg/dL (ref 8.7–10.2)
Chloride: 97 mmol/L (ref 97–108)
GFR calc Af Amer: 116 mL/min/{1.73_m2} (ref >59)
GFR calc non Af Amer: 101 mL/min/{1.73_m2} (ref >59)
GLOBULIN, TOTAL: 2.3 g/dL (ref 1.5–4.5)
Glucose: 158 mg/dL — ABNORMAL HIGH (ref 65–99)
Potassium: 4.5 mmol/L (ref 3.5–5.2)
Sodium: 138 mmol/L (ref 134–144)
Total Bilirubin: 1 mg/dL (ref 0.0–1.2)
Total Protein: 6.9 g/dL (ref 6.0–8.5)

## 2013-09-18 LAB — CK: Total CK: 105 U/L (ref 24–204)

## 2013-09-18 LAB — HEMOGLOBIN A1C
ESTIMATED AVERAGE GLUCOSE: 177 mg/dL
Hgb A1c MFr Bld: 7.8 % — ABNORMAL HIGH (ref 4.8–5.6)

## 2013-09-21 ENCOUNTER — Encounter: Payer: Self-pay | Admitting: Internal Medicine

## 2013-09-21 ENCOUNTER — Ambulatory Visit (INDEPENDENT_AMBULATORY_CARE_PROVIDER_SITE_OTHER): Payer: 59 | Admitting: Internal Medicine

## 2013-09-21 VITALS — BP 130/86 | HR 81 | Resp 10 | Wt 249.0 lb

## 2013-09-21 DIAGNOSIS — E785 Hyperlipidemia, unspecified: Secondary | ICD-10-CM

## 2013-09-21 DIAGNOSIS — E1165 Type 2 diabetes mellitus with hyperglycemia: Principal | ICD-10-CM

## 2013-09-21 DIAGNOSIS — IMO0001 Reserved for inherently not codable concepts without codable children: Secondary | ICD-10-CM

## 2013-09-21 DIAGNOSIS — E669 Obesity, unspecified: Secondary | ICD-10-CM

## 2013-09-21 DIAGNOSIS — I10 Essential (primary) hypertension: Secondary | ICD-10-CM

## 2013-09-21 DIAGNOSIS — J309 Allergic rhinitis, unspecified: Secondary | ICD-10-CM

## 2013-09-21 MED ORDER — POTASSIUM CITRATE ER 10 MEQ (1080 MG) PO TBCR: 10.0000 meq | EXTENDED_RELEASE_TABLET | Freq: Two times a day (BID) | ORAL | Status: DC

## 2013-09-21 MED ORDER — ATORVASTATIN CALCIUM 10 MG PO TABS: 10.0000 mg | ORAL_TABLET | Freq: Every day | ORAL | Status: DC

## 2013-09-21 NOTE — Progress Notes (Signed)
Patient ID: Kyle Mcclure, male   DOB: February 13, 1961, 53 y.o.   MRN: [id number]    Chief Complaint  Patient presents with  . Medical Management of Chronic Issues    3 month follow-up, discuss labs (copy printed)    Allergies  Allergen Reactions  . Hydrocodone Nausea And Vomiting  . Oxycodone Nausea And Vomiting   HPI 53 y/o male pt here for RV cbg 134-234 Compliant with his medication Has gained weight. Has been busy with school and has not been exercising recently His sinus problem has resolved  Review of Systems  Constitutional: Negative for fever, chills, weight loss, malaise/fatigue and diaphoresis.  HENT: Negative for hearing loss and sore throat.   Eyes: Negative for blurred vision, double vision and discharge.  Respiratory: Negative for cough, sputum production, shortness of breath and wheezing.   Cardiovascular: Negative for chest pain, palpitations, orthopnea and leg swelling.  Gastrointestinal: Negative for heartburn, nausea, vomiting, abdominal pain, diarrhea and constipation.  Genitourinary: Negative for dysuria, urgency, frequency and flank pain.  Musculoskeletal: Negative for back pain, falls, joint pain and myalgias.  Skin: Negative for itching and rash.  Neurological: Negative for dizziness, tingling, focal weakness and headaches.  Psychiatric/Behavioral: Negative for depression and memory loss. The patient is not nervous/anxious.      Past Medical History  Diagnosis Date  . Kidney stones   . Diabetes mellitus without complication   . Hypertension    Past Surgical History  Procedure Laterality Date  . Wisdomteeth extraction    . Tonsillectomy    . Lithotripsy  12/2012   Current Outpatient Prescriptions on File Prior to Visit  Medication Sig Dispense Refill  . aspirin 81 MG tablet Take 81 mg by mouth daily.      . cetirizine (ZYRTEC) 10 MG tablet Take 10 mg by mouth daily.      . fluticasone (FLONASE) 50 MCG/ACT nasal spray Place 1 spray into both nostrils  daily.  16 g  1  . glipiZIDE (GLUCOTROL) 5 MG tablet Take 1 tablet (5 mg total) by mouth 2 (two) times daily before a meal.  60 tablet  3  . glucose blood (ONE TOUCH TEST STRIPS) test strip Check blood sugar 2 x daily as directed DX: 250.02  200 each  3  . Lancet Devices (ONE TOUCH DELICA LANCING DEV) MISC by Does not apply route. Check blood sugar 2 x daily as directed DX: 250.02      . lisinopril (PRINIVIL,ZESTRIL) 10 MG tablet Take one tablet by mouth once daily to control blood pressure  30 tablet  3  . metFORMIN (GLUCOPHAGE) 500 MG tablet TAKE 2 TABLETS (1,000 MG TOTAL) BY MOUTH 2 (TWO) TIMES DAILY WITH A MEAL.  60 tablet  0   No current facility-administered medications on file prior to visit.    Physical exam BP 130/86  Pulse 81  Resp 10  Wt 249 lb (112.946 kg)  SpO2 96%  General- adult male in no acute distress, obese Head- atraumatic, normocephalic Eyes- PERRLA, EOMI, no pallor, no icterus Neck- no lymphadenopathy, no thyromegaly, no jugular vein distension, no carotid bruit Chest- no chest wall deformities, no chest wall tenderness Cardiovascular- normal s1,s2, no murmurs/ rubs/ gallops Respiratory- bilateral clear to auscultation, no wheeze, no rhonchi, no crackles Abdomen- bowel sounds present, soft, non tender, no organomegaly,no guarding or rigidity, no CVA tenderness Musculoskeletal- able to move all 4 extremities, no spinal and paraspinal tenderness, steady gait, no use of assistive device Neurological- no focal deficit, normal  reflexes, normal muscle strength, normal sensation to fine touch and vibration Psychiatry- alert and oriented to person, place and time, normal mood and affect  Labs- Lab Results  Component Value Date   HGBA1C 7.8* 09/17/2013   Lipid Panel     Component Value Date/Time   TRIG 935* 06/15/2013 1207   HDL 18* 06/15/2013 1207   CHOLHDL 13.1* 06/15/2013 1207   LDLCALC Comment 06/15/2013 1207   Lab Results  Component Value Date   TSH 1.070  06/15/2013   CMP     Component Value Date/Time   NA 138 09/17/2013 0903   NA 134* 10/29/2012 0505   K 4.5 09/17/2013 0903   CL 97 09/17/2013 0903   CO2 22 09/17/2013 0903   GLUCOSE 158* 09/17/2013 0903   GLUCOSE 344* 10/29/2012 0505   BUN 15 09/17/2013 0903   BUN 15 10/29/2012 0505   CREATININE 0.84 09/17/2013 0903   CALCIUM 9.2 09/17/2013 0903   PROT 6.9 09/17/2013 0903   AST 48* 09/17/2013 0903   ALT 56* 09/17/2013 0903   ALKPHOS 85 09/17/2013 0903   BILITOT 1.0 09/17/2013 0903   GFRNONAA 101 09/17/2013 0903   GFRAA 116 09/17/2013 0903   Assessment/plan  1. Type II or unspecified type diabetes mellitus without mention of complication, uncontrolled Improved a1c s/o improved dm. Continue metformin 1000 mg bid with glipizide 5 mg bid. Check urine microalbumin today. Continue asa and lisinopril.  - Hemoglobin A1c; Future - Microalbumin/Creatinine Ratio, Urine  2. Unspecified essential hypertension Continue lisinopril 10 mg daily.  3. Other and unspecified hyperlipidemia zetia is being expensive for patient, reviewed lft and ck. Will start him on atorvastatin 10 mg daily for now and monitor  4. Obesity, mild Encouraged exercise for weight loss. Pt willing to try strict exercise regimen in 2 weeks period. Dietary counselling provided  5. Allergic rhinitis Continue zyrtec with prn flonase

## 2013-09-22 LAB — MICROALBUMIN / CREATININE URINE RATIO
Creatinine, Ur: 123.1 mg/dL (ref 22.0–328.0)
MICROALB/CREAT RATIO: 7.2 mg/g{creat} (ref 0.0–30.0)
MICROALBUM., U, RANDOM: 8.9 ug/mL (ref 0.0–17.0)

## 2013-09-23 ENCOUNTER — Other Ambulatory Visit: Payer: Self-pay | Admitting: *Deleted

## 2013-09-23 MED ORDER — METFORMIN HCL 500 MG PO TABS: ORAL_TABLET | ORAL | Status: DC

## 2013-09-23 NOTE — Telephone Encounter (Signed)
Patient requested 

## 2013-11-25 ENCOUNTER — Other Ambulatory Visit: Payer: Self-pay | Admitting: Internal Medicine

## 2013-12-17 ENCOUNTER — Other Ambulatory Visit: Payer: 59

## 2013-12-17 DIAGNOSIS — E1165 Type 2 diabetes mellitus with hyperglycemia: Principal | ICD-10-CM

## 2013-12-17 DIAGNOSIS — IMO0001 Reserved for inherently not codable concepts without codable children: Secondary | ICD-10-CM

## 2013-12-18 LAB — HEMOGLOBIN A1C
Est. average glucose Bld gHb Est-mCnc: 192 mg/dL
HEMOGLOBIN A1C: 8.3 % — AB (ref 4.8–5.6)

## 2013-12-21 ENCOUNTER — Encounter: Payer: Self-pay | Admitting: Internal Medicine

## 2013-12-21 ENCOUNTER — Ambulatory Visit (INDEPENDENT_AMBULATORY_CARE_PROVIDER_SITE_OTHER): Payer: 59 | Admitting: Internal Medicine

## 2013-12-21 VITALS — BP 124/82 | HR 78 | Temp 98.2°F | Wt 252.0 lb

## 2013-12-21 DIAGNOSIS — E1165 Type 2 diabetes mellitus with hyperglycemia: Principal | ICD-10-CM

## 2013-12-21 DIAGNOSIS — I10 Essential (primary) hypertension: Secondary | ICD-10-CM

## 2013-12-21 DIAGNOSIS — E785 Hyperlipidemia, unspecified: Secondary | ICD-10-CM | POA: Insufficient documentation

## 2013-12-21 DIAGNOSIS — IMO0001 Reserved for inherently not codable concepts without codable children: Secondary | ICD-10-CM

## 2013-12-21 DIAGNOSIS — E669 Obesity, unspecified: Secondary | ICD-10-CM

## 2013-12-21 DIAGNOSIS — Z23 Encounter for immunization: Secondary | ICD-10-CM

## 2013-12-21 MED ORDER — GLIPIZIDE 10 MG PO TABS: ORAL_TABLET | ORAL | Status: DC

## 2013-12-21 MED ORDER — GLUCOSE BLOOD VI STRP: ORAL_STRIP | Status: DC

## 2013-12-21 NOTE — Progress Notes (Signed)
Patient ID: Kyle Mcclure, male   DOB: 1961/03/29, 53 y.o.   MRN: [id number]    Chief Complaint  Patient presents with  . Medical Management of Chronic Issues    Follow-up on BS, discuss labs (copy printed)    Allergies  Allergen Reactions  . Hydrocodone Nausea And Vomiting  . Oxycodone Nausea And Vomiting   HPI 53 y/o male patient is here for follow up. He has diabetes, obesity, hyperlipidemia and HTN.  cbg 123-197 on average at home cbg checked thrice a week No dizziness or lightheadedness No low sugar readings Taking glipizide 5 mg bid and metformin 500 mg bid Has gained weight Not exercising at present  Review of Systems   Constitutional: Negative for fever, chills, malaise/fatigue and diaphoresis.   HENT: Negative for hearing loss and sore throat.    Eyes: Negative for blurred vision, double vision and discharge.   Respiratory: Negative for cough, sputum production, shortness of breath and wheezing.    Cardiovascular: Negative for chest pain, palpitations, orthopnea and leg swelling.   Gastrointestinal: Negative for heartburn, nausea, vomiting, abdominal pain, diarrhea and constipation.   Genitourinary: Negative for dysuria, urgency, frequency and flank pain.  no nocturia Musculoskeletal: Negative for back pain, falls, joint pain and myalgias.   Skin: Negative for itching and rash.   Neurological: Negative for dizziness, tingling, focal weakness and headaches.   Psychiatric/Behavioral: Negative for depression and memory loss. The patient is not nervous/anxious.       Past Medical History  Diagnosis Date  . Kidney stones   . Diabetes mellitus without complication   . Hypertension    Current Outpatient Prescriptions on File Prior to Visit  Medication Sig Dispense Refill  . aspirin 81 MG tablet Take 81 mg by mouth daily.      Marland Kitchen. atorvastatin (LIPITOR) 10 MG tablet Take 1 tablet (10 mg total) by mouth daily.  90 tablet  3  . cetirizine (ZYRTEC) 10 MG tablet Take 10 mg  by mouth daily.      . fluticasone (FLONASE) 50 MCG/ACT nasal spray Place 1 spray into both nostrils daily.  16 g  1  . Lancet Devices (ONE TOUCH DELICA LANCING DEV) MISC by Does not apply route. Check blood sugar 2 x daily as directed DX: 250.02      . lisinopril (PRINIVIL,ZESTRIL) 10 MG tablet Take one tablet by mouth once daily to control blood pressure  30 tablet  3  . metFORMIN (GLUCOPHAGE) 500 MG tablet Take two tablets by mouth two times daily with a meal to control blood sugar  120 tablet  5   No current facility-administered medications on file prior to visit.   Physical exam BP 124/82  Pulse 78  Temp(Src) 98.2 F (36.8 C) (Oral)  Wt 252 lb (114.306 kg)  SpO2 98%  General- adult male in no acute distress, obese Head- atraumatic, normocephalic Neck- no lymphadenopathy, no thyromegaly Cardiovascular- normal s1,s2, no murmurs/ rubs/ gallops Respiratory- bilateral clear to auscultation, no wheeze, no rhonchi, no crackles Abdomen- bowel sounds present, soft, non tender Musculoskeletal- able to move all 4 extremities, no spinal and paraspinal tenderness, steady gait, no use of assistive device Neurological- no focal deficit Psychiatry- alert and oriented to person, place and time, normal mood and affect  Labs-  Lipid Panel     Component Value Date/Time   TRIG 935* 06/15/2013 1207   HDL 18* 06/15/2013 1207   CHOLHDL 13.1* 06/15/2013 1207   LDLCALC Comment 06/15/2013 1207  Lab Results  Component Value Date   HGBA1C 8.3* 12/17/2013   Lab Results  Component Value Date   CREATININE 0.84 09/17/2013   CMP     Component Value Date/Time   NA 138 09/17/2013 0903   NA 134* 10/29/2012 0505   K 4.5 09/17/2013 0903   CL 97 09/17/2013 0903   CO2 22 09/17/2013 0903   GLUCOSE 158* 09/17/2013 0903   GLUCOSE 344* 10/29/2012 0505   BUN 15 09/17/2013 0903   BUN 15 10/29/2012 0505   CREATININE 0.84 09/17/2013 0903   CALCIUM 9.2 09/17/2013 0903   PROT 6.9 09/17/2013 0903   AST 48* 09/17/2013  0903   ALT 56* 09/17/2013 0903   ALKPHOS 85 09/17/2013 0903   BILITOT 1.0 09/17/2013 0903   GFRNONAA 101 09/17/2013 0903   GFRAA 116 09/17/2013 0903   Immunization History  Administered Date(s) Administered  . Influenza Whole 04/13/2013  . Pneumococcal Conjugate-13 06/23/2013  . Td 06/03/2008   Assessment/plan  1. Type II or unspecified type diabetes mellitus without mention of complication, uncontrolled Uncontrolled at present. Will increase glipizide to 10 mg bid and continue metformin 1000 mg bid. Normal urine microlabumin. Continue statin, ACEI and ASA. uptodate on eye exam. Pneumococcal vaccine 23 given today - CMP; Future - Hemoglobin A1c; Future - TSH; Future - Pneumococcal polysaccharide vaccine 23-valent greater than or equal to 2yo subcutaneous/IM  2. Unspecified essential hypertension Continue lisinopril 10 mg daily with aspirin - CBC with Differential; Future  3. Hyperlipidemia LDL goal <100 Continue atorvastatin 10 mg daily. Since its only been few months of pt being on it, recheck lipid panel prior to next visit - Lipid Panel; Future  4. Obesity, mild Has had weight gain, Encouraged exercise for weight loss. Dietary restriction encouraged. Check tsh prior to next visit

## 2014-03-02 ENCOUNTER — Other Ambulatory Visit: Payer: Self-pay | Admitting: *Deleted

## 2014-03-02 MED ORDER — LISINOPRIL 10 MG PO TABS: ORAL_TABLET | ORAL | Status: DC

## 2014-03-02 NOTE — Telephone Encounter (Signed)
[facility]

## 2014-03-16 ENCOUNTER — Other Ambulatory Visit: Payer: Self-pay | Admitting: *Deleted

## 2014-03-16 DIAGNOSIS — Z125 Encounter for screening for malignant neoplasm of prostate: Secondary | ICD-10-CM

## 2014-03-18 ENCOUNTER — Other Ambulatory Visit: Payer: 59

## 2014-03-18 DIAGNOSIS — I10 Essential (primary) hypertension: Secondary | ICD-10-CM

## 2014-03-18 DIAGNOSIS — E785 Hyperlipidemia, unspecified: Secondary | ICD-10-CM

## 2014-03-18 DIAGNOSIS — IMO0002 Reserved for concepts with insufficient information to code with codable children: Secondary | ICD-10-CM

## 2014-03-18 DIAGNOSIS — E1165 Type 2 diabetes mellitus with hyperglycemia: Secondary | ICD-10-CM

## 2014-03-18 DIAGNOSIS — Z125 Encounter for screening for malignant neoplasm of prostate: Secondary | ICD-10-CM

## 2014-03-19 LAB — CBC WITH DIFFERENTIAL/PLATELET
Basophils Absolute: 0.1 10*3/uL (ref 0.0–0.2)
Basos: 1 %
EOS: 3 %
Eosinophils Absolute: 0.3 10*3/uL (ref 0.0–0.4)
HEMATOCRIT: 44.6 % (ref 37.5–51.0)
HEMOGLOBIN: 15.3 g/dL (ref 12.6–17.7)
IMMATURE GRANULOCYTES: 0 %
Immature Grans (Abs): 0 10*3/uL (ref 0.0–0.1)
LYMPHS ABS: 2.4 10*3/uL (ref 0.7–3.1)
LYMPHS: 26 %
MCH: 30.9 pg (ref 26.6–33.0)
MCHC: 34.3 g/dL (ref 31.5–35.7)
MCV: 90 fL (ref 79–97)
Monocytes Absolute: 0.5 10*3/uL (ref 0.1–0.9)
Monocytes: 6 %
Neutrophils Absolute: 5.8 10*3/uL (ref 1.4–7.0)
Neutrophils Relative %: 64 %
RBC: 4.95 x10E6/uL (ref 4.14–5.80)
RDW: 12.8 % (ref 12.3–15.4)
WBC: 9.1 10*3/uL (ref 3.4–10.8)

## 2014-03-19 LAB — COMPREHENSIVE METABOLIC PANEL
A/G RATIO: 1.6 (ref 1.1–2.5)
ALK PHOS: 93 IU/L (ref 39–117)
ALT: 70 IU/L — AB (ref 0–44)
AST: 60 IU/L — AB (ref 0–40)
Albumin: 4.4 g/dL (ref 3.5–5.5)
BILIRUBIN TOTAL: 0.9 mg/dL (ref 0.0–1.2)
BUN/Creatinine Ratio: 15 (ref 9–20)
BUN: 14 mg/dL (ref 6–24)
CHLORIDE: 97 mmol/L (ref 97–108)
CO2: 23 mmol/L (ref 18–29)
Calcium: 9.5 mg/dL (ref 8.7–10.2)
Creatinine, Ser: 0.94 mg/dL (ref 0.76–1.27)
GFR, EST AFRICAN AMERICAN: 107 mL/min/{1.73_m2} (ref >59)
GFR, EST NON AFRICAN AMERICAN: 93 mL/min/{1.73_m2} (ref >59)
GLUCOSE: 183 mg/dL — AB (ref 65–99)
Globulin, Total: 2.7 g/dL (ref 1.5–4.5)
Potassium: 4.6 mmol/L (ref 3.5–5.2)
SODIUM: 136 mmol/L (ref 134–144)
Total Protein: 7.1 g/dL (ref 6.0–8.5)

## 2014-03-19 LAB — LIPID PANEL
CHOL/HDL RATIO: 4.6 ratio (ref 0.0–5.0)
CHOLESTEROL TOTAL: 133 mg/dL (ref 100–199)
HDL: 29 mg/dL — AB (ref >39)
LDL Calculated: 70 mg/dL (ref 0–99)
Triglycerides: 172 mg/dL — ABNORMAL HIGH (ref 0–149)
VLDL Cholesterol Cal: 34 mg/dL (ref 5–40)

## 2014-03-19 LAB — PSA: PSA: 0.3 ng/mL (ref 0.0–4.0)

## 2014-03-19 LAB — HEMOGLOBIN A1C
ESTIMATED AVERAGE GLUCOSE: 192 mg/dL
HEMOGLOBIN A1C: 8.3 % — AB (ref 4.8–5.6)

## 2014-03-19 LAB — TSH: TSH: 1.94 u[IU]/mL (ref 0.450–4.500)

## 2014-03-21 ENCOUNTER — Other Ambulatory Visit: Payer: Self-pay | Admitting: Internal Medicine

## 2014-03-23 ENCOUNTER — Ambulatory Visit: Payer: Self-pay | Admitting: Internal Medicine

## 2014-04-05 ENCOUNTER — Ambulatory Visit (INDEPENDENT_AMBULATORY_CARE_PROVIDER_SITE_OTHER): Payer: 59

## 2014-04-05 ENCOUNTER — Ambulatory Visit (INDEPENDENT_AMBULATORY_CARE_PROVIDER_SITE_OTHER): Payer: 59 | Admitting: Internal Medicine

## 2014-04-05 ENCOUNTER — Encounter: Payer: Self-pay | Admitting: Internal Medicine

## 2014-04-05 VITALS — BP 128/70 | HR 87 | Temp 98.9°F | Resp 10 | Ht 68.0 in | Wt 252.0 lb

## 2014-04-05 DIAGNOSIS — E785 Hyperlipidemia, unspecified: Secondary | ICD-10-CM

## 2014-04-05 DIAGNOSIS — E669 Obesity, unspecified: Secondary | ICD-10-CM

## 2014-04-05 DIAGNOSIS — E119 Type 2 diabetes mellitus without complications: Secondary | ICD-10-CM

## 2014-04-05 DIAGNOSIS — E1169 Type 2 diabetes mellitus with other specified complication: Secondary | ICD-10-CM | POA: Insufficient documentation

## 2014-04-05 DIAGNOSIS — I10 Essential (primary) hypertension: Secondary | ICD-10-CM

## 2014-04-05 DIAGNOSIS — Z23 Encounter for immunization: Secondary | ICD-10-CM

## 2014-04-05 MED ORDER — LINAGLIPTIN 5 MG PO TABS: 5.0000 mg | ORAL_TABLET | Freq: Every day | ORAL | Status: DC

## 2014-04-05 NOTE — Progress Notes (Signed)
Patient ID: Kyle Mcclure, male   DOB: 12-23-1960, 53 y.o.   MRN: [id number]    Chief Complaint  Patient presents with  . Medical Management of Chronic Issues    3 month follow-up, discuss labs (copy printed)    Allergies  Allergen Reactions  . Hydrocodone Nausea And Vomiting  . Oxycodone Nausea And Vomiting   HPI 53 y/o male patient is here for follow up. He has diabetes, obesity, hyperlipidemia and HTN. cbg 181 this am. Average cbg 150-230. Taking metformin 1000 mg bid and glipizide 10 mg bid. Weight stable. Has not been exercising Complaint with his medications  Review of Systems   Constitutional: Negative for fever, chills, malaise/fatigue and diaphoresis.   HENT: Negative for hearing loss and sore throat.    Eyes: Negative for blurred vision, double vision and discharge.   Respiratory: Negative for cough, sputum production, shortness of breath and wheezing.    Cardiovascular: Negative for chest pain, palpitations, orthopnea and leg swelling.   Gastrointestinal: Negative for heartburn, nausea, vomiting, abdominal pain, diarrhea and constipation.   Genitourinary: Negative for dysuria, urgency, frequency and flank pain. no nocturia. Follows with urology Musculoskeletal: Negative for back pain, falls, joint pain and myalgias.   Skin: Negative for itching and rash.   Neurological: Negative for dizziness, tingling, focal weakness and headaches.   Psychiatric/Behavioral: Negative for depression and memory loss. The patient is not nervous/anxious.    Past Medical History  Diagnosis Date  . Kidney stones   . Diabetes mellitus without complication   . Hypertension    Current Outpatient Prescriptions on File Prior to Visit  Medication Sig Dispense Refill  . aspirin 81 MG tablet Take 81 mg by mouth daily.    Marland Kitchen. atorvastatin (LIPITOR) 10 MG tablet Take 1 tablet (10 mg total) by mouth daily. 90 tablet 3  . cetirizine (ZYRTEC) 10 MG tablet Take 10 mg by mouth daily.    Marland Kitchen. glipiZIDE  (GLUCOTROL) 10 MG tablet TAKE 1 TABLET  BY MOUTH TWO TIMES DAILY BEFORE A MEAL for diabetes 60 tablet 3  . glucose blood (ONE TOUCH TEST STRIPS) test strip Check blood sugar 2 x daily as directed DX: 250.02 200 each 3  . Lancet Devices (ONE TOUCH DELICA LANCING DEV) MISC by Does not apply route. Check blood sugar 2 x daily as directed DX: 250.02    . lisinopril (PRINIVIL,ZESTRIL) 10 MG tablet Take one tablet by mouth once daily to control blood pressure 30 tablet 3  . metFORMIN (GLUCOPHAGE) 500 MG tablet Take two tablets by mouth two times daily with a meal to control blood sugar 120 tablet 5  . potassium citrate (UROCIT-K) 10 MEQ (1080 MG) SR tablet 2 by mouth once daily to prevent kidney stones     No current facility-administered medications on file prior to visit.    Physical exam BP 128/70 mmHg  Pulse 87  Temp(Src) 98.9 F (37.2 C) (Oral)  Resp 10  Ht 5\' 8"  (1.727 m)  Wt 252 lb (114.306 kg)  BMI 38.33 kg/m2  SpO2 94%  Wt Readings from Last 3 Encounters:  04/05/14 252 lb (114.306 kg)  12/21/13 252 lb (114.306 kg)  09/21/13 249 lb (112.946 kg)   General- adult male in no acute distress, obese Head- atraumatic, normocephalic Neck- no lymphadenopathy, no thyromegaly Cardiovascular- normal s1,s2, no murmurs/ rubs/ gallops Respiratory- bilateral clear to auscultation, no wheeze, no rhonchi, no crackles Abdomen- bowel sounds present, soft, non tender Musculoskeletal- able to move all 4 extremities, no spinal  and paraspinal tenderness, steady gait, no use of assistive device Neurological- no focal deficit Psychiatry- alert and oriented to person, place and time, normal mood and affect   Lab Results  Component Value Date   HGBA1C 8.3* 03/18/2014   Lipid Panel     Component Value Date/Time   TRIG 172* 03/18/2014 0842   HDL 29* 03/18/2014 0842   CHOLHDL 4.6 03/18/2014 0842   LDLCALC 70 03/18/2014 0842    Lab Results  Component Value Date   PSA 0.3 03/18/2014   CMP       Component Value Date/Time   NA 136 03/18/2014 0842   NA 134* 10/29/2012 0505   K 4.6 03/18/2014 0842   CL 97 03/18/2014 0842   CO2 23 03/18/2014 0842   GLUCOSE 183* 03/18/2014 0842   GLUCOSE 344* 10/29/2012 0505   BUN 14 03/18/2014 0842   BUN 15 10/29/2012 0505   CREATININE 0.94 03/18/2014 0842   CALCIUM 9.5 03/18/2014 0842   PROT 7.1 03/18/2014 0842   AST 60* 03/18/2014 0842   ALT 70* 03/18/2014 0842   ALKPHOS 93 03/18/2014 0842   BILITOT 0.9 03/18/2014 0842   GFRNONAA 93 03/18/2014 0842   GFRAA 107 03/18/2014 0842    Assessment/plan  1. Diabetes mellitus type 2 in obese a1c reviewed. Continue metformin and glipizide. Add tradjenta 5 mg daily. Common side effects explained. Continue statin, ACEI, aspirin. Normal urine microalbumin. uptodate wit foot and eye exam. Influenza vaccine today. uptodate with pneuumococcal 23 - linagliptin (TRADJENTA) 5 MG TABS tablet; Take 1 tablet (5 mg total) by mouth daily.  Dispense: 30 tablet; Refill: 3 - Hemoglobin A1c; Future  2. Essential hypertension Stable. Continue lisinopril and asa  3. Hyperlipidemia LDL goal <100 Continue atorvastatin 10 mg daily. Improved TG level  4. Obesity, mild Encouraged exercise for weight loss. Dietary restriction encouraged. Normal tsh. 30 min exercise for 5 days a week encouraged

## 2014-04-30 ENCOUNTER — Other Ambulatory Visit: Payer: Self-pay | Admitting: Internal Medicine

## 2014-05-12 ENCOUNTER — Other Ambulatory Visit: Payer: Self-pay | Admitting: Internal Medicine

## 2014-07-02 ENCOUNTER — Other Ambulatory Visit: Payer: Self-pay | Admitting: Internal Medicine

## 2014-07-26 ENCOUNTER — Encounter: Payer: Self-pay | Admitting: Internal Medicine

## 2014-08-03 ENCOUNTER — Other Ambulatory Visit: Payer: 59

## 2014-08-03 DIAGNOSIS — E669 Obesity, unspecified: Principal | ICD-10-CM

## 2014-08-03 DIAGNOSIS — E1169 Type 2 diabetes mellitus with other specified complication: Secondary | ICD-10-CM

## 2014-08-10 ENCOUNTER — Encounter: Payer: 59 | Admitting: Internal Medicine

## 2014-08-15 ENCOUNTER — Other Ambulatory Visit: Payer: Self-pay | Admitting: Internal Medicine

## 2014-09-22 LAB — HM DIABETES EYE EXAM

## 2014-09-27 ENCOUNTER — Encounter: Payer: 59 | Admitting: Internal Medicine

## 2014-09-27 ENCOUNTER — Other Ambulatory Visit: Payer: Self-pay | Admitting: Internal Medicine

## 2014-09-29 ENCOUNTER — Encounter: Payer: Self-pay | Admitting: Nurse Practitioner

## 2014-09-29 ENCOUNTER — Ambulatory Visit (INDEPENDENT_AMBULATORY_CARE_PROVIDER_SITE_OTHER): Payer: 59 | Admitting: Nurse Practitioner

## 2014-09-29 VITALS — BP 128/90 | HR 93 | Temp 98.1°F | Resp 18 | Ht 68.0 in | Wt 252.0 lb

## 2014-09-29 DIAGNOSIS — Z0000 Encounter for general adult medical examination without abnormal findings: Secondary | ICD-10-CM

## 2014-09-29 DIAGNOSIS — Z1211 Encounter for screening for malignant neoplasm of colon: Secondary | ICD-10-CM

## 2014-09-29 DIAGNOSIS — E1169 Type 2 diabetes mellitus with other specified complication: Secondary | ICD-10-CM

## 2014-09-29 DIAGNOSIS — E669 Obesity, unspecified: Secondary | ICD-10-CM | POA: Diagnosis not present

## 2014-09-29 DIAGNOSIS — E119 Type 2 diabetes mellitus without complications: Secondary | ICD-10-CM

## 2014-09-29 NOTE — Progress Notes (Signed)
Patient ID: Kyle Mcclure, male   DOB: [birth year], 54 y.o.   MRN: [id number]    PCP: Sharon SellerEUBANKS, Helmuth Recupero K, NP  Allergies  Allergen Reactions  . Hydrocodone Nausea And Vomiting  . Oxycodone Nausea And Vomiting    Chief Complaint  Patient presents with  . Annual Exam    Annual exam ,EKG     HPI: Patient is a 54 y.o. male seen in the office today for wellness exam.    Screenings: Colon Cancer- NEED  Prostate Cancer- PSA done 10/15 0.3, follows with urologist   Vaccines Up to date on: influenza, pneumococcal and prevnar, Tdap  Smoking status: previous smoker, none currently  Alcohol use: occasional, 1 beer during the week, weekends sometimes  Dentist: every 6 months routinely Ophthalmologist: April 2016  Exercise regimen: walks 15 mins twice daily Diet: attempting diabetic diet  Advanced Directive information Does patient have an advance directive?: No, Would patient like information on creating an advanced directive?: Yes - Educational materials given Review of Systems:  Review of Systems  Constitutional: Negative for activity change, appetite change, fatigue and unexpected weight change.  HENT: Negative for congestion and hearing loss.   Eyes: Negative.   Respiratory: Negative for cough and shortness of breath.   Cardiovascular: Negative for chest pain, palpitations and leg swelling.  Gastrointestinal: Negative for abdominal pain, diarrhea and constipation.  Genitourinary: Negative for dysuria and difficulty urinating.       Follows with Urology   Musculoskeletal: Negative for myalgias and arthralgias.  Skin: Negative for color change and wound.  Neurological: Negative for dizziness and weakness.  Psychiatric/Behavioral: Negative for behavioral problems, confusion and agitation.    Past Medical History  Diagnosis Date  . Kidney stones   . Diabetes mellitus without complication   . Hypertension    Past Surgical History  Procedure Laterality Date  .  Wisdomteeth extraction    . Tonsillectomy    . Lithotripsy  12/2012   Social History:   reports that he quit smoking about 24 years ago. He does not have any smokeless tobacco history on file. He reports that he drinks about 1.5 oz of alcohol per week. He reports that he does not use illicit drugs.  History reviewed. No pertinent family history.  Medications: Patient's Medications  New Prescriptions   No medications on file  Previous Medications   ASPIRIN 81 MG TABLET    Take 81 mg by mouth daily.   ATORVASTATIN (LIPITOR) 10 MG TABLET    TAKE 1 TABLET (10 MG TOTAL) BY MOUTH DAILY.   CETIRIZINE (ZYRTEC) 10 MG TABLET    Take 10 mg by mouth daily.   GLIPIZIDE (GLUCOTROL) 10 MG TABLET    TAKE 1 TABLET BY MOUTH TWO TIMES DAILY BEFORE A MEAL FOR DIABETES   GLUCOSE BLOOD (ONE TOUCH TEST STRIPS) TEST STRIP    Check blood sugar 2 x daily as directed DX: 250.02   LANCET DEVICES (ONE TOUCH DELICA LANCING DEV) MISC    by Does not apply route. Check blood sugar 2 x daily as directed DX: 250.02   LISINOPRIL (PRINIVIL,ZESTRIL) 10 MG TABLET    TAKE 1 TABLET BY MOUTH EVERY DAY TO CONTROL BLOOD PRESSURE   METFORMIN (GLUCOPHAGE) 500 MG TABLET    TAKE 2 TABLETS BY MOUTH TWICE A DAY WITH A MEAL TO CONTROL BLOOD SUGAR   POTASSIUM CITRATE (UROCIT-K) 10 MEQ (1080 MG) SR TABLET    2 by mouth once daily to prevent kidney stones   TRADJENTA 5  MG TABS TABLET    TAKE 1 TABLET BY MOUTH ONCE DAILY  Modified Medications   No medications on file  Discontinued Medications   No medications on file     Physical Exam:  Filed Vitals:   09/29/14 1117  BP: 128/90  Pulse: 93  Temp: 98.1 F (36.7 C)  TempSrc: Oral  Resp: 18  Height:  (1.727 m)  Weight: 252 lb (114.306 kg)  SpO2: 94%    Physical Exam  Constitutional: He is oriented to person, place, and time. He appears well-developed and well-nourished. No distress.  HENT:  Head: Normocephalic and atraumatic.  Right Ear: External ear normal.  Left Ear:  External ear normal.  Nose: Nose normal.  Mouth/Throat: Oropharynx is clear and moist. No oropharyngeal exudate.  Eyes: Conjunctivae and EOM are normal. Pupils are equal, round, and reactive to light.  Neck: Normal range of motion. Neck supple. No thyromegaly present.  Cardiovascular: Normal rate, regular rhythm, normal heart sounds and intact distal pulses.   Pulmonary/Chest: Effort normal and breath sounds normal.  Abdominal: Soft. Bowel sounds are normal. He exhibits no distension. There is no tenderness.  Musculoskeletal: He exhibits no edema or tenderness.  Lymphadenopathy:    He has no cervical adenopathy.  Neurological: He is alert and oriented to person, place, and time. He has normal reflexes. No cranial nerve deficit. Coordination normal.  Skin: Skin is warm and dry. He is not diaphoretic.  Psychiatric: He has a normal mood and affect.    Labs reviewed: Basic Metabolic Panel:  Recent Labs  09/81/19 0842  NA 136  K 4.6  CL 97  CO2 23  GLUCOSE 183*  BUN 14  CREATININE 0.94  CALCIUM 9.5  TSH 1.940   Liver Function Tests:  Recent Labs  03/18/14 0842  AST 60*  ALT 70*  ALKPHOS 93  BILITOT 0.9  PROT 7.1   No results for input(s): LIPASE, AMYLASE in the last 8760 hours. No results for input(s): AMMONIA in the last 8760 hours. CBC:  Recent Labs  03/18/14 0842  WBC 9.1  NEUTROABS 5.8  HGB 15.3  HCT 44.6  MCV 90   Lipid Panel:  Recent Labs  03/18/14 0842  CHOL 133  HDL 29*  LDLCALC 70  TRIG 147*  CHOLHDL 4.6   TSH:  Recent Labs  03/18/14 0842  TSH 1.940   A1C: Lab Results  Component Value Date   HGBA1C 8.3* 03/18/2014     Assessment/Plan 1. Special screening for malignant neoplasms, colon - Ambulatory referral to Gastroenterology  2. Diabetes mellitus type 2 in obese -reviewed diet and exercise modifications and lifestyle changes  - conts on metformin, glipizide and tradjenta -on ace and statin - Comprehensive metabolic panel -  Hemoglobin A1c  3. Preventive  PREVENTIVE COUNSELING:  The patient was counseled regarding the appropriate use of alcohol, prevention of dental and periodontal disease, diet, regular sustained exercise for at least 30 minutes 5 times per week, testicular self-examination on a monthly basis, avoiding tobacco use  and recommended schedule for GI hemoccult testing, colonoscopy, cholesterol, thyroid and diabetes screening.  Lab work obtained today and follow up in 3 months

## 2014-09-29 NOTE — Patient Instructions (Signed)
Make obtainable goals for exercise and diet changes  30 mins of cardiovascular activity 5 days a week is recommended   We will follow up in 3 months

## 2014-09-30 LAB — COMPREHENSIVE METABOLIC PANEL
ALT: 65 IU/L — AB (ref 0–44)
AST: 63 IU/L — ABNORMAL HIGH (ref 0–40)
Albumin/Globulin Ratio: 1.7 (ref 1.1–2.5)
Albumin: 4.5 g/dL (ref 3.5–5.5)
Alkaline Phosphatase: 93 IU/L (ref 39–117)
BILIRUBIN TOTAL: 0.9 mg/dL (ref 0.0–1.2)
BUN/Creatinine Ratio: 11 (ref 9–20)
BUN: 11 mg/dL (ref 6–24)
CO2: 20 mmol/L (ref 18–29)
CREATININE: 0.96 mg/dL (ref 0.76–1.27)
Calcium: 9.4 mg/dL (ref 8.7–10.2)
Chloride: 96 mmol/L — ABNORMAL LOW (ref 97–108)
GFR calc Af Amer: 104 mL/min/{1.73_m2} (ref >59)
GFR, EST NON AFRICAN AMERICAN: 90 mL/min/{1.73_m2} (ref >59)
GLOBULIN, TOTAL: 2.7 g/dL (ref 1.5–4.5)
Glucose: 213 mg/dL — ABNORMAL HIGH (ref 65–99)
POTASSIUM: 4.4 mmol/L (ref 3.5–5.2)
Sodium: 137 mmol/L (ref 134–144)
TOTAL PROTEIN: 7.2 g/dL (ref 6.0–8.5)

## 2014-09-30 LAB — HEMOGLOBIN A1C
Est. average glucose Bld gHb Est-mCnc: 232 mg/dL
Hgb A1c MFr Bld: 9.7 % — ABNORMAL HIGH (ref 4.8–5.6)

## 2014-10-04 ENCOUNTER — Encounter: Payer: Self-pay | Admitting: *Deleted

## 2014-10-10 ENCOUNTER — Ambulatory Visit (INDEPENDENT_AMBULATORY_CARE_PROVIDER_SITE_OTHER): Payer: 59 | Admitting: Pharmacotherapy

## 2014-10-10 ENCOUNTER — Encounter: Payer: Self-pay | Admitting: Pharmacotherapy

## 2014-10-10 VITALS — BP 112/68 | HR 77 | Temp 98.1°F | Resp 18 | Ht 68.0 in | Wt 250.0 lb

## 2014-10-10 DIAGNOSIS — E119 Type 2 diabetes mellitus without complications: Secondary | ICD-10-CM

## 2014-10-10 DIAGNOSIS — E669 Obesity, unspecified: Secondary | ICD-10-CM

## 2014-10-10 DIAGNOSIS — I10 Essential (primary) hypertension: Secondary | ICD-10-CM

## 2014-10-10 DIAGNOSIS — E1169 Type 2 diabetes mellitus with other specified complication: Secondary | ICD-10-CM

## 2014-10-10 MED ORDER — EMPAGLIFLOZIN 10 MG PO TABS: 10.0000 mg | ORAL_TABLET | Freq: Every day | ORAL | Status: DC

## 2014-10-10 NOTE — Progress Notes (Signed)
  Subjective:    Kyle Mcclure is a 54 y.o.white male who presents for follow-up of Type 2 diabetes mellitus.   A1C high at 9.7% Currently taking Tradjenta, glipizide, and metformin. He says he has had DM for 2 years.  He was initially started on metformin, then glipizide.  The Tradjenta was started in the last year.  He is not consistent with SMBG. Lowest BG 176m/dl Usual average is 200's.  He frequently misses his second dose of glipizide. He tires to eat healthy - especially after hearing how high his A1C is. Often skips meals. Wears a Garmon - he is not doing routine exercise. Denies problems with feet. Denies problems with vision.  Had eye exam 2 weeks ago - all good. Nocturia once per night on average. Denies peripheral edema.  Avoids sodas.  Drinks water.  Review of Systems A comprehensive review of systems was negative except for: Genitourinary: positive for nocturia    Objective:    BP 112/68 mmHg  Pulse 77  Temp(Src) 98.1 F (36.7 C) (Oral)  Resp 18  Ht 5' 8"$  (1.727 m)  Wt 250 lb (113.399 kg)  BMI 38.02 kg/m2  SpO2 94%  General:  alert, cooperative and no distress  Oropharynx: normal findings: lips normal without lesions and gums healthy   Eyes:  negative findings: lids and lashes normal and conjunctivae and sclerae normal   Ears:  external ears normal        Lung: clear to auscultation bilaterally  Heart:  regular rate and rhythm     Extremities: no edema  Skin: warm and dry, no hyperpigmentation, vitiligo, or suspicious lesions     Neuro: mental status, speech normal, alert and oriented x3 and gait and station normal   Lab Review GLUCOSE (mg/dL)  Date Value  09/29/2014 213*  03/18/2014 183*  09/17/2013 158*   GLUCOSE, BLD (mg/dL)  Date Value  10/29/2012 344*   CO2 (mmol/L)  Date Value  09/29/2014 20  03/18/2014 23  09/17/2013 22   BUN (mg/dL)  Date Value  09/29/2014 11  03/18/2014 14  09/17/2013 15  10/29/2012 15   CREATININE,  SER (mg/dL)  Date Value  09/29/2014 0.96  03/18/2014 0.94  09/17/2013 0.84       Assessment:    Diabetes Mellitus type II, under poor control. A1C above goal <140/90 BP at goal <140/90   Plan:    1.  Rx changes: Add Jardiance 138mdaily.  Counseled on risk / benefit and potential ADR.  Counseled on need for adequate hydration and good personal hygeine. 2.  Continue Metformin, glipizide, and Tradjenta. 3.  Discussed changing Tradjenta to a GLP-1 agent or the addition of basal insulin.  He declined injectables at this time. 4.  Counseled on nutrition goals and meal planning. 5.  Counseled on need for routine exercise.  Goal is 30-45 minutes 5 x week. 6.  HTN well controlled. 7.  RTC in 6 weeks.  Goal fasting BG:  70-120, goal for all BG to be <200 by next OV.

## 2014-10-10 NOTE — Patient Instructions (Signed)
Start Jardiance 10mg  daily No skipping meals. Needs to exercise 30-45 minutes 5 x week.

## 2014-10-13 ENCOUNTER — Encounter: Payer: Self-pay | Admitting: Nurse Practitioner

## 2014-10-24 ENCOUNTER — Other Ambulatory Visit: Payer: Self-pay | Admitting: Internal Medicine

## 2014-11-21 ENCOUNTER — Ambulatory Visit (INDEPENDENT_AMBULATORY_CARE_PROVIDER_SITE_OTHER): Payer: 59 | Admitting: Pharmacotherapy

## 2014-11-21 ENCOUNTER — Encounter: Payer: Self-pay | Admitting: Pharmacotherapy

## 2014-11-21 VITALS — BP 108/74 | HR 86 | Temp 98.6°F | Resp 20 | Ht 68.0 in | Wt 246.8 lb

## 2014-11-21 DIAGNOSIS — E1169 Type 2 diabetes mellitus with other specified complication: Secondary | ICD-10-CM

## 2014-11-21 DIAGNOSIS — E669 Obesity, unspecified: Secondary | ICD-10-CM | POA: Diagnosis not present

## 2014-11-21 DIAGNOSIS — I10 Essential (primary) hypertension: Secondary | ICD-10-CM

## 2014-11-21 DIAGNOSIS — E119 Type 2 diabetes mellitus without complications: Secondary | ICD-10-CM | POA: Diagnosis not present

## 2014-11-21 MED ORDER — EMPAGLIFLOZIN 25 MG PO TABS: 25.0000 mg | ORAL_TABLET | Freq: Every day | ORAL | Status: DC

## 2014-11-21 NOTE — Patient Instructions (Signed)
Increase Jardiance to 25 mg daily

## 2014-11-21 NOTE — Progress Notes (Signed)
  Subjective:    Kyle Mcclure is a 54 y.o.white male who presents for follow-up of Type 2 diabetes mellitus.   Started Jardiance 10mg  daily 6 weeks ago. He declined use of injectables (GLP-1 or basal insulin)  He says his BG was 138mg /dl this morning. He says average BG:  140's Denies hypoglycemia - lowest BG:  103mg /dl Not much exercise. He is making healthier food choices.  Has been cutting portion sizes. Nocturia at times. Denies polyuria.  No dysuria. Staying well hydrated. Denies problems with feet.  Denies peripheral edema. Denies problems with vision.   Review of Systems A comprehensive review of systems was negative except for: Genitourinary: positive for nocturia    Objective:    BP 108/74 mmHg  Pulse 86  Temp(Src) 98.6 F (37 C) (Oral)  Resp 20  Ht 5\' 8"  (1.727 m)  Wt 246 lb 12.8 oz (111.948 kg)  BMI 37.53 kg/m2  SpO2 95%  General:  alert, cooperative and no distress  Oropharynx: normal findings: lips normal without lesions and gums healthy   Eyes:  negative findings: lids and lashes normal and conjunctivae and sclerae normal   Ears:  external ears normal        Lung: clear to auscultation bilaterally  Heart:  regular rate and rhythm     Extremities: extremities normal, atraumatic, no cyanosis or edema  Skin: warm and dry, no hyperpigmentation, vitiligo, or suspicious lesions     Neuro: mental status, speech normal, alert and oriented x3 and gait and station normal   Lab Review GLUCOSE (mg/dL)  Date Value  71/21/9758 213*  03/18/2014 183*  09/17/2013 158*   GLUCOSE, BLD (mg/dL)  Date Value  83/25/4982 344*   CO2 (mmol/L)  Date Value  09/29/2014 20  03/18/2014 23  09/17/2013 22   BUN (mg/dL)  Date Value  64/15/8309 11  03/18/2014 14  09/17/2013 15  10/29/2012 15   CREATININE, SER (mg/dL)  Date Value  40/76/8088 0.96  03/18/2014 0.94  09/17/2013 0.84       Assessment:    Diabetes Mellitus type II, under good control.  Average  BG improving.  Now all 200mg /dl.  Goal A1C <7% BP at goal <140/90.  Improved Weight is down.   Plan:    1.  Rx changes: Increase Jardiance 25mg  QD  2.  Continue Metformin, Tradjenta, glipizide. 3.  Counseled on nutrition goals.  Praised smaller portion sizes. 4.  Counseled on need for routine exercise.  Goal is 30-45 minutes 5 x week. 5.  BP has improved. 6.  Weight is down since addition of Jardiance.  Encouraged lifestyle changes to continue weight loss.

## 2014-12-30 ENCOUNTER — Other Ambulatory Visit: Payer: 59

## 2015-01-03 ENCOUNTER — Encounter: Payer: Self-pay | Admitting: Nurse Practitioner

## 2015-01-03 ENCOUNTER — Other Ambulatory Visit: Payer: Self-pay | Admitting: *Deleted

## 2015-01-03 ENCOUNTER — Ambulatory Visit (INDEPENDENT_AMBULATORY_CARE_PROVIDER_SITE_OTHER): Payer: 59 | Admitting: Nurse Practitioner

## 2015-01-03 ENCOUNTER — Encounter: Payer: Self-pay | Admitting: Gastroenterology

## 2015-01-03 ENCOUNTER — Other Ambulatory Visit: Payer: 59

## 2015-01-03 VITALS — BP 126/78 | HR 80 | Temp 98.3°F | Resp 16 | Ht 69.5 in | Wt 241.0 lb

## 2015-01-03 DIAGNOSIS — N400 Enlarged prostate without lower urinary tract symptoms: Secondary | ICD-10-CM

## 2015-01-03 DIAGNOSIS — I10 Essential (primary) hypertension: Secondary | ICD-10-CM

## 2015-01-03 DIAGNOSIS — E785 Hyperlipidemia, unspecified: Secondary | ICD-10-CM | POA: Diagnosis not present

## 2015-01-03 DIAGNOSIS — E1169 Type 2 diabetes mellitus with other specified complication: Secondary | ICD-10-CM

## 2015-01-03 DIAGNOSIS — E669 Obesity, unspecified: Principal | ICD-10-CM

## 2015-01-03 DIAGNOSIS — E119 Type 2 diabetes mellitus without complications: Secondary | ICD-10-CM

## 2015-01-03 DIAGNOSIS — Z1211 Encounter for screening for malignant neoplasm of colon: Secondary | ICD-10-CM | POA: Diagnosis not present

## 2015-01-03 NOTE — Addendum Note (Signed)
Addended by: Maurice Small on: 01/03/2015 09:23 AM   Modules accepted: Orders

## 2015-01-03 NOTE — Patient Instructions (Signed)
Check blood sugars and bring to visit with Cathey  Get blood work today   Keep up the good work with activity and diet    Follow up in 6 months

## 2015-01-03 NOTE — Progress Notes (Signed)
Patient ID: Kyle Mcclure, male   DOB: [birth year], 54 y.o.   MRN: [id number]    PCP: [name], NP  Allergies  Allergen Reactions  . Hydrocodone Nausea And Vomiting  . Oxycodone Nausea And Vomiting    Chief Complaint  Patient presents with  . Medical Management of Chronic Issues    3 month follow-up, not fasting       HPI: Patient is a 54 y.o. male seen in the office today for routine management of chronic conditions. Has not been checking blood sugars. Attempting diabetic diet.  Has lost weight.  More active- got a dog.  Walking at least 30 mins daily - has a jawbone- which is tracking steps getting at least 8000 steps. Doing more activities Never heard from GI   Review of Systems:  Review of Systems  Constitutional: Negative for activity change, appetite change, fatigue and unexpected weight change.  HENT: Negative for congestion and hearing loss.   Eyes: Negative.   Respiratory: Negative for cough and shortness of breath.   Cardiovascular: Negative for chest pain, palpitations and leg swelling.  Gastrointestinal: Negative for abdominal pain, diarrhea and constipation.  Genitourinary: Negative for dysuria and difficulty urinating.  Musculoskeletal: Negative for myalgias and arthralgias.  Skin: Negative for color change and wound.  Neurological: Negative for dizziness and weakness.  Psychiatric/Behavioral: Negative for behavioral problems, confusion and agitation.    Past Medical History  Diagnosis Date  . Kidney stones   . Diabetes mellitus without complication   . Hypertension    Past Surgical History  Procedure Laterality Date  . Wisdomteeth extraction    . Tonsillectomy    . Lithotripsy  12/2012   Social History:   reports that he quit smoking about 24 years ago. He does not have any smokeless tobacco history on file. He reports that he drinks about 1.5 oz of alcohol per week. He reports that he does not use illicit drugs.  History reviewed. No  pertinent family history.  Medications: Patient's Medications  New Prescriptions   No medications on file  Previous Medications   ASPIRIN 81 MG TABLET    Take 81 mg by mouth daily.   ATORVASTATIN (LIPITOR) 10 MG TABLET    TAKE 1 TABLET (10 MG TOTAL) BY MOUTH DAILY.   CETIRIZINE (ZYRTEC) 10 MG TABLET    Take 10 mg by mouth daily.   EMPAGLIFLOZIN (JARDIANCE) 25 MG TABS TABLET    Take 25 mg by mouth daily.   GLIPIZIDE (GLUCOTROL) 10 MG TABLET    TAKE 1 TABLET BY MOUTH TWO TIMES DAILY BEFORE A MEAL FOR DIABETES   GLUCOSE BLOOD (ONE TOUCH TEST STRIPS) TEST STRIP    Check blood sugar 2 x daily as directed DX: 250.02   LANCET DEVICES (ONE TOUCH DELICA LANCING DEV) MISC    by Does not apply route. Check blood sugar 2 x daily as directed DX: 250.02   LISINOPRIL (PRINIVIL,ZESTRIL) 10 MG TABLET    TAKE 1 TABLET BY MOUTH EVERY DAY TO CONTROL BLOOD PRESSURE   METFORMIN (GLUCOPHAGE) 500 MG TABLET    TAKE 2 TABLETS BY MOUTH TWICE A DAY WITH A MEAL TO CONTROL BLOOD SUGAR   POTASSIUM CITRATE (UROCIT-K) 10 MEQ (1080 MG) SR TABLET    2 by mouth once daily to prevent kidney stones   TRADJENTA 5 MG TABS TABLET    TAKE 1 TABLET BY MOUTH ONCE DAILY  Modified Medications   No medications on file  Discontinued Medications   No  medications on file     Physical Exam:  Filed Vitals:   01/03/15 0847  BP: 126/78  Pulse: 80  Temp: 98.3 F (36.8 C)  TempSrc: Oral  Resp: 16  Height: 5' 9.5" (1.765 m)  Weight: 241 lb (109.317 kg)  SpO2: 94%    Physical Exam  Constitutional: He is oriented to person, place, and time. He appears well-developed and well-nourished. No distress.  HENT:  Head: Normocephalic and atraumatic.  Eyes: Conjunctivae and EOM are normal. Pupils are equal, round, and reactive to light.  Neck: Normal range of motion. Neck supple.  Cardiovascular: Normal rate, regular rhythm and normal heart sounds.   Pulmonary/Chest: Effort normal and breath sounds normal.  Abdominal: Soft. Bowel  sounds are normal.  Musculoskeletal: He exhibits no edema or tenderness.  Neurological: He is alert and oriented to person, place, and time.  Skin: Skin is warm and dry. He is not diaphoretic.  Psychiatric: He has a normal mood and affect.    Labs reviewed: Basic Metabolic Panel:  Recent Labs  16/10/96 0842 09/29/14 1205  NA 136 137  K 4.6 4.4  CL 97 96*  CO2 23 20  GLUCOSE 183* 213*  BUN 14 11  CREATININE 0.94 0.96  CALCIUM 9.5 9.4  TSH 1.940  --    Liver Function Tests:  Recent Labs  03/18/14 0842 09/29/14 1205  AST 60* 63*  ALT 70* 65*  ALKPHOS 93 93  BILITOT 0.9 0.9  PROT 7.1 7.2   No results for input(s): LIPASE, AMYLASE in the last 8760 hours. No results for input(s): AMMONIA in the last 8760 hours. CBC:  Recent Labs  03/18/14 0842  WBC 9.1  NEUTROABS 5.8  HGB 15.3  HCT 44.6  MCV 90   Lipid Panel:  Recent Labs  03/18/14 0842  CHOL 133  HDL 29*  LDLCALC 70  TRIG 045*  CHOLHDL 4.6   TSH:  Recent Labs  03/18/14 0842  TSH 1.940   A1C: Lab Results  Component Value Date   HGBA1C 9.7* 09/29/2014     Assessment/Plan  1. Diabetes mellitus type 2 in obese -has not been taking blood sugar, compliant with medication -to cont exercise and diet modifications -take and record blood sugars and bring to visit with Cathey  2. Essential hypertension -stable, conts on lisinopril, heart healthy diet  3. BPH (benign prostatic hyperplasia) -stable, without change, conts on urocit twice daily conts to follow with urology   4. Hyperlipidemia LDL goal <100 LDL at goal in October, conts on lipitor  5. Obesity, mild -has lost weight, conts to work on diet and exercise  6. Special screening for malignant neoplasms, colon - Ambulatory referral to Gastroenterology   Janene Harvey. Biagio Borg  [facility] [phone] 8 am - 5 pm) [phone] (after hours)

## 2015-01-04 LAB — COMPREHENSIVE METABOLIC PANEL
A/G RATIO: 1.8 (ref 1.1–2.5)
ALK PHOS: 82 IU/L (ref 39–117)
ALT: 44 IU/L (ref 0–44)
AST: 32 IU/L (ref 0–40)
Albumin: 4.4 g/dL (ref 3.5–5.5)
BUN/Creatinine Ratio: 14 (ref 9–20)
BUN: 12 mg/dL (ref 6–24)
Bilirubin Total: 0.4 mg/dL (ref 0.0–1.2)
CALCIUM: 9.7 mg/dL (ref 8.7–10.2)
CHLORIDE: 100 mmol/L (ref 97–108)
CO2: 22 mmol/L (ref 18–29)
CREATININE: 0.87 mg/dL (ref 0.76–1.27)
GFR calc Af Amer: 114 mL/min/{1.73_m2} (ref >59)
GFR calc non Af Amer: 99 mL/min/{1.73_m2} (ref >59)
Globulin, Total: 2.5 g/dL (ref 1.5–4.5)
Glucose: 141 mg/dL — ABNORMAL HIGH (ref 65–99)
Potassium: 4.4 mmol/L (ref 3.5–5.2)
Sodium: 140 mmol/L (ref 134–144)
Total Protein: 6.9 g/dL (ref 6.0–8.5)

## 2015-01-04 LAB — HEMOGLOBIN A1C
ESTIMATED AVERAGE GLUCOSE: 157 mg/dL
Hgb A1c MFr Bld: 7.1 % — ABNORMAL HIGH (ref 4.8–5.6)

## 2015-01-18 ENCOUNTER — Telehealth: Payer: Self-pay | Admitting: *Deleted

## 2015-01-18 NOTE — Telephone Encounter (Signed)
Spoke with patient and informed him that he didn't need more lab work, but to keep his scheduled appointments.

## 2015-01-18 NOTE — Telephone Encounter (Signed)
Patient called regarding labs on 01/19/2015, he wants to know if he still needs to come to that appointment, since he had labs on his last visit even though he was not fasting at that time. Please Advise!

## 2015-01-18 NOTE — Telephone Encounter (Signed)
No got lab work done at appt, to keep follow up appts

## 2015-01-19 ENCOUNTER — Other Ambulatory Visit: Payer: 59

## 2015-01-23 ENCOUNTER — Ambulatory Visit (INDEPENDENT_AMBULATORY_CARE_PROVIDER_SITE_OTHER): Payer: 59 | Admitting: Pharmacotherapy

## 2015-01-23 ENCOUNTER — Encounter: Payer: Self-pay | Admitting: Pharmacotherapy

## 2015-01-23 VITALS — BP 120/82 | HR 77 | Temp 98.2°F | Resp 18 | Ht 70.0 in | Wt 243.8 lb

## 2015-01-23 DIAGNOSIS — E669 Obesity, unspecified: Secondary | ICD-10-CM

## 2015-01-23 DIAGNOSIS — E119 Type 2 diabetes mellitus without complications: Secondary | ICD-10-CM

## 2015-01-23 DIAGNOSIS — J0100 Acute maxillary sinusitis, unspecified: Secondary | ICD-10-CM

## 2015-01-23 DIAGNOSIS — I10 Essential (primary) hypertension: Secondary | ICD-10-CM

## 2015-01-23 DIAGNOSIS — E1169 Type 2 diabetes mellitus with other specified complication: Secondary | ICD-10-CM

## 2015-01-23 MED ORDER — AMOXICILLIN-POT CLAVULANATE 875-125 MG PO TABS: 1.0000 | ORAL_TABLET | Freq: Two times a day (BID) | ORAL | Status: DC

## 2015-01-23 NOTE — Progress Notes (Signed)
  Subjective:    Kyle Mcclure is a 54 y.o.white male who presents for follow-up of Type 2 diabetes mellitus.  A1C improved from 9.7% to 7.1% Has sinus congestion, post-nasal drip.  No fever.  Inner-ear dizziness.  Right jaw pain.  Has had a "cold" x 10 days.  Has been taking Zyrtec 41m daily.  Not checking blood glucose very often. His blood glucose was 93mdl  Going to the gym daily. Struggles with healthy food choices. Denies problems with feet. Denies problems with eyes Nocturia each night. Denies dysuria.  Staying well hydrated.   Review of Systems A comprehensive review of systems was negative except for: Ears, nose, mouth, throat, and face: positive for nasal congestion and sinus pressure and drainage Genitourinary: positive for nocturia    Objective:    BP 120/82 mmHg  Pulse 77  Temp(Src) 98.2 F (36.8 C) (Oral)  Resp 18  Ht 5' 10"$  (1.778 m)  Wt 243 lb 12.8 oz (110.587 kg)  BMI 34.98 kg/m2  SpO2 97%  General:  alert, cooperative and no distress  Oropharynx: normal findings: lips normal without lesions and gums healthy and abnormal findings: some sinus swelling   Eyes:  negative findings: lids and lashes normal and conjunctivae and sclerae normal   Ears:  external ears normal        Lung: clear to auscultation bilaterally  Heart:  regular rate and rhythm     Extremities: no edema  Skin: warm and dry, no hyperpigmentation, vitiligo, or suspicious lesions     Neuro: mental status, speech normal, alert and oriented x3 and gait and station normal   Lab Review GLUCOSE (mg/dL)  Date Value  01/03/2015 141*  09/29/2014 213*  03/18/2014 183*   GLUCOSE, BLD (mg/dL)  Date Value  10/29/2012 344*   CO2 (mmol/L)  Date Value  01/03/2015 22  09/29/2014 20  03/18/2014 23   BUN (mg/dL)  Date Value  01/03/2015 12  09/29/2014 11  03/18/2014 14  10/29/2012 15   CREATININE, SER (mg/dL)  Date Value  01/03/2015 0.87  09/29/2014 0.96  03/18/2014 0.94        Assessment:    Diabetes Mellitus type II, under good control.   BP at goal <140/90    Plan:    1.  Rx changes: Augmentin 87520mID x 1 week for sinusitis.  Discussed with Dr. ReeMariea Clonts.  Continue Jardiance, metformin, Tradjenta, and glipizide. 3.  Counseled on nutrition goals.  Needs to improve. 4.  Exercise goal is 30-45 minutes 5 x week. 5.  BP at goal <140/90. 6.  Weight is down.

## 2015-01-23 NOTE — Patient Instructions (Signed)
Work on nutrition Take Augmentin  twice daily x 10 days Take Align  daily due to antibiotic

## 2015-02-18 ENCOUNTER — Other Ambulatory Visit: Payer: Self-pay | Admitting: Internal Medicine

## 2015-02-25 ENCOUNTER — Other Ambulatory Visit: Payer: Self-pay | Admitting: Internal Medicine

## 2015-02-27 ENCOUNTER — Ambulatory Visit (AMBULATORY_SURGERY_CENTER): Payer: Self-pay | Admitting: *Deleted

## 2015-02-27 VITALS — Ht 68.0 in | Wt 244.0 lb

## 2015-02-27 DIAGNOSIS — Z1211 Encounter for screening for malignant neoplasm of colon: Secondary | ICD-10-CM

## 2015-02-27 MED ORDER — PEG-KCL-NACL-NASULF-NA ASC-C 100 G PO SOLR: 1.0000 | Freq: Once | ORAL | Status: DC

## 2015-02-27 NOTE — Progress Notes (Signed)
Patient denies any allergies to egg or soy products. Patient denies complications with anesthesia/sedation.  Patient denies oxygen use at home and denies diet medications. Emmi instructions for colonoscopy explained and given to patient.  

## 2015-03-13 ENCOUNTER — Ambulatory Visit (AMBULATORY_SURGERY_CENTER): Payer: 59 | Admitting: Gastroenterology

## 2015-03-13 ENCOUNTER — Encounter: Payer: Self-pay | Admitting: Gastroenterology

## 2015-03-13 VITALS — BP 106/69 | HR 68 | Temp 98.7°F | Resp 21 | Ht 68.0 in | Wt 244.0 lb

## 2015-03-13 DIAGNOSIS — Z1211 Encounter for screening for malignant neoplasm of colon: Secondary | ICD-10-CM

## 2015-03-13 MED ORDER — SODIUM CHLORIDE 0.9 % IV SOLN: 500.0000 mL | INTRAVENOUS | Status: DC

## 2015-03-13 NOTE — Progress Notes (Signed)
To recovery, report to Hodges, RN, VSS 

## 2015-03-13 NOTE — Patient Instructions (Signed)
YOU HAD AN ENDOSCOPIC PROCEDURE TODAY AT THE [facility]:   Refer to the procedure report that was given to you for any specific questions about what was found during the examination.  If the procedure report does not answer your questions, please call your gastroenterologist to clarify.  If you requested that your care partner not be given the details of your procedure findings, then the procedure report has been included in a sealed envelope for you to review at your convenience later.  YOU SHOULD EXPECT: Some feelings of bloating in the abdomen. Passage of more gas than usual.  Walking can help get rid of the air that was put into your GI tract during the procedure and reduce the bloating. If you had a lower endoscopy (such as a colonoscopy or flexible sigmoidoscopy) you may notice spotting of blood in your stool or on the toilet paper. If you underwent a bowel prep for your procedure, you may not have a normal bowel movement for a few days.  Please Note:  You might notice some irritation and congestion in your nose or some drainage.  This is from the oxygen used during your procedure.  There is no need for concern and it should clear up in a day or so.  SYMPTOMS TO REPORT IMMEDIATELY:   Following lower endoscopy (colonoscopy or flexible sigmoidoscopy):  Excessive amounts of blood in the stool  Significant tenderness or worsening of abdominal pains  Swelling of the abdomen that is new, acute  Fever of 100F or higher   For urgent or emergent issues, a gastroenterologist can be reached at any hour by calling [phone].   DIET: Your first meal following the procedure should be a small meal and then it is ok to progress to your normal diet. Heavy or fried foods are harder to digest and may make you feel nauseous or bloated.  Likewise, meals heavy in dairy and vegetables can increase bloating.  Drink plenty of fluids but you should avoid alcoholic beverages for 24  hours.  ACTIVITY:  You should plan to take it easy for the rest of today and you should NOT DRIVE or use heavy machinery until tomorrow (because of the sedation medicines used during the test).    FOLLOW UP: Our staff will call the number listed on your records the next business day following your procedure to check on you and address any questions or concerns that you may have regarding the information given to you following your procedure. If we do not reach you, we will leave a message.  However, if you are feeling well and you are not experiencing any problems, there is no need to return our call.  We will assume that you have returned to your regular daily activities without incident.  If any biopsies were taken you will be contacted by phone or by letter within the next 1-3 weeks.  Please call us at [phone] if you have not heard about the biopsies in 3 weeks.    SIGNATURES/CONFIDENTIALITY: You and/or your care partner have signed paperwork which will be entered into your electronic medical record.  These signatures attest to the fact that that the information above on your After Visit Summary has been reviewed and is understood.  Full responsibility of the confidentiality of this discharge information lies with you and/or your care-partner.  Read all handouts given to you by your recovery room nurse. 

## 2015-03-13 NOTE — Op Note (Signed)
[facility] [address] [place], [address]   COLONOSCOPY PROCEDURE REPORT  PATIENT: Kyle Mcclure, Kyle Mcclure  MR#: [id number] BIRTHDATE: 08/05/1960 , 53  yrs. old GENDER: male ENDOSCOPIST: Rachael Fee, MD REFERRED BY: Abbey Chatters, MD PROCEDURE DATE:  03/13/2015 PROCEDURE:   Colonoscopy, screening First Screening Colonoscopy - Avg.  risk and is 50 yrs.  old or older Yes.  Prior Negative Screening - Now for repeat screening. N/A  History of Adenoma - Now for follow-up colonoscopy & has been > or = to 3 yrs.  N/A  Recommend repeat exam, <10 yrs? No ASA CLASS:   Class II INDICATIONS:Screening for colonic neoplasia and Colorectal Neoplasm Risk Assessment for this procedure is average risk. MEDICATIONS: Monitored anesthesia care and Propofol 200 mg IV  DESCRIPTION OF PROCEDURE:   After the risks benefits and alternatives of the procedure were thoroughly explained, informed consent was obtained.  The digital rectal exam revealed no abnormalities of the rectum.   The [id number]  endoscope was introduced through the anus and advanced to the cecum, which was identified by both the appendix and ileocecal valve. No adverse events experienced.   The quality of the prep was excellent.  The instrument was then slowly withdrawn as the colon was fully examined. Estimated blood loss is zero unless otherwise noted in this procedure report.   COLON FINDINGS: A normal appearing cecum, ileocecal valve, and appendiceal orifice were identified.  The ascending, transverse, descending, sigmoid colon, and rectum appeared unremarkable. Retroflexed views revealed no abnormalities. The time to cecum = 1.8 Withdrawal time = 6.3   The scope was withdrawn and the procedure completed. COMPLICATIONS: There were no immediate complications.  ENDOSCOPIC IMPRESSION: Normal colonoscopy No polyps or cancers  RECOMMENDATIONS: You should continue to follow colorectal cancer screening  guidelines for "routine risk" patients with a repeat colonoscopy in 10 years.   eSigned:  Rachael Fee, MD 03/13/2015 11:58 AM

## 2015-03-14 ENCOUNTER — Telehealth: Payer: Self-pay | Admitting: Emergency Medicine

## 2015-03-14 NOTE — Telephone Encounter (Signed)
Left message, no identifier 

## 2015-05-07 ENCOUNTER — Other Ambulatory Visit: Payer: Self-pay | Admitting: Internal Medicine

## 2015-05-18 ENCOUNTER — Other Ambulatory Visit: Payer: 59

## 2015-05-18 DIAGNOSIS — E1169 Type 2 diabetes mellitus with other specified complication: Secondary | ICD-10-CM

## 2015-05-18 DIAGNOSIS — E669 Obesity, unspecified: Principal | ICD-10-CM

## 2015-05-19 ENCOUNTER — Other Ambulatory Visit: Payer: 59

## 2015-05-19 LAB — COMPREHENSIVE METABOLIC PANEL
ALT: 61 IU/L — ABNORMAL HIGH (ref 0–44)
AST: 39 IU/L (ref 0–40)
Albumin/Globulin Ratio: 1.8 (ref 1.1–2.5)
Albumin: 4.9 g/dL (ref 3.5–5.5)
Alkaline Phosphatase: 95 IU/L (ref 39–117)
BUN/Creatinine Ratio: 18 (ref 9–20)
BUN: 19 mg/dL (ref 6–24)
Bilirubin Total: 0.9 mg/dL (ref 0.0–1.2)
CO2: 25 mmol/L (ref 18–29)
Calcium: 9.7 mg/dL (ref 8.7–10.2)
Chloride: 95 mmol/L — ABNORMAL LOW (ref 96–106)
Creatinine, Ser: 1.04 mg/dL (ref 0.76–1.27)
GFR calc Af Amer: 94 mL/min/{1.73_m2} (ref >59)
GFR calc non Af Amer: 81 mL/min/{1.73_m2} (ref >59)
Globulin, Total: 2.8 g/dL (ref 1.5–4.5)
Glucose: 122 mg/dL — ABNORMAL HIGH (ref 65–99)
Potassium: 4.5 mmol/L (ref 3.5–5.2)
Sodium: 139 mmol/L (ref 134–144)
Total Protein: 7.7 g/dL (ref 6.0–8.5)

## 2015-05-19 LAB — MICROALBUMIN / CREATININE URINE RATIO
Creatinine, Urine: 88.9 mg/dL
MICROALB/CREAT RATIO: 5.8 mg/g creat (ref 0.0–30.0)
Microalbumin, Urine: 5.2 ug/mL

## 2015-05-19 LAB — HEMOGLOBIN A1C
Est. average glucose Bld gHb Est-mCnc: 146 mg/dL
Hgb A1c MFr Bld: 6.7 % — ABNORMAL HIGH (ref 4.8–5.6)

## 2015-05-22 ENCOUNTER — Encounter: Payer: Self-pay | Admitting: Pharmacotherapy

## 2015-05-22 ENCOUNTER — Ambulatory Visit (INDEPENDENT_AMBULATORY_CARE_PROVIDER_SITE_OTHER): Payer: 59 | Admitting: Pharmacotherapy

## 2015-05-22 VITALS — BP 120/82 | HR 96 | Temp 98.2°F | Resp 20 | Ht 68.0 in | Wt 243.6 lb

## 2015-05-22 DIAGNOSIS — Z23 Encounter for immunization: Secondary | ICD-10-CM

## 2015-05-22 DIAGNOSIS — E1169 Type 2 diabetes mellitus with other specified complication: Secondary | ICD-10-CM

## 2015-05-22 DIAGNOSIS — E669 Obesity, unspecified: Secondary | ICD-10-CM | POA: Diagnosis not present

## 2015-05-22 DIAGNOSIS — E119 Type 2 diabetes mellitus without complications: Secondary | ICD-10-CM | POA: Diagnosis not present

## 2015-05-22 DIAGNOSIS — I10 Essential (primary) hypertension: Secondary | ICD-10-CM | POA: Diagnosis not present

## 2015-05-22 NOTE — Progress Notes (Signed)
  Subjective:    Kyle Mcclure is a 54 y.o.white male who presents for follow-up of Type 2 diabetes mellitus.   A1C now 6.7% (was 7.1%, 9.7%) Feels good.  Has been complaining of nasal congestion.  Using Alka-seltzer cold & sinus.  Average BG:  120's No hypoglycemia Eating healthy choices. Walking for exercise. Denies problems with feet. Denies problems with vision.  Wears contact lenses. No dysuria. Nocturia once per night. Denies peripheral edema.  Review of Systems A comprehensive review of systems was negative except for: Eyes: positive for contacts/glasses Genitourinary: positive for nocturia    Objective:    BP 120/82 mmHg  Pulse 96  Temp(Src) 98.2 F (36.8 C) (Oral)  Resp 20  Ht 5' 8"$  (1.727 m)  Wt 243 lb 9.6 oz (110.496 kg)  BMI 37.05 kg/m2  SpO2 96%  General:  alert, cooperative and no distress  Oropharynx: normal findings: lips normal without lesions and gums healthy   Eyes:  negative findings: lids and lashes normal and conjunctivae and sclerae normal   Ears:  external ears normal        Lung: clear to auscultation bilaterally  Heart:  regular rate and rhythm     Extremities: extremities normal, atraumatic, no cyanosis or edema  Skin: warm and dry, no hyperpigmentation, vitiligo, or suspicious lesions     Neuro: mental status, speech normal, alert and oriented x3 and gait and station normal   Lab Review GLUCOSE (mg/dL)  Date Value  05/18/2015 122*  01/03/2015 141*  09/29/2014 213*   GLUCOSE, BLD (mg/dL)  Date Value  10/29/2012 344*   CO2 (mmol/L)  Date Value  05/18/2015 25  01/03/2015 22  09/29/2014 20   BUN (mg/dL)  Date Value  05/18/2015 19  01/03/2015 12  09/29/2014 11  10/29/2012 15   CREATININE, SER (mg/dL)  Date Value  05/18/2015 1.04  01/03/2015 0.87  09/29/2014 0.96       Assessment:    Diabetes Mellitus type II, under excellent control.   BP at goal <140/90   Plan:    1.  Rx changes: none 2.  Continue  Metformin, glipizide, Jardiance, and Tradjenta. 3.  Counseled on nutrition goals. 4.  Praised exercise efforts.  Goal is 30-45 minutes 5 x week. 5.  BP at goal <140/90

## 2015-06-09 ENCOUNTER — Other Ambulatory Visit: Payer: Self-pay | Admitting: Internal Medicine

## 2015-06-21 ENCOUNTER — Other Ambulatory Visit: Payer: Self-pay | Admitting: Internal Medicine

## 2015-06-26 ENCOUNTER — Other Ambulatory Visit: Payer: Self-pay | Admitting: *Deleted

## 2015-06-26 DIAGNOSIS — E1169 Type 2 diabetes mellitus with other specified complication: Secondary | ICD-10-CM

## 2015-06-26 DIAGNOSIS — E669 Obesity, unspecified: Principal | ICD-10-CM

## 2015-06-26 MED ORDER — EMPAGLIFLOZIN 25 MG PO TABS
25.0000 mg | ORAL_TABLET | Freq: Every day | ORAL | Status: DC
Start: 2015-06-26 — End: 2015-07-06

## 2015-06-26 NOTE — Telephone Encounter (Signed)
Patient requested refill on medication. Going out of town on Wednesday.

## 2015-07-06 ENCOUNTER — Encounter: Payer: Self-pay | Admitting: Nurse Practitioner

## 2015-07-06 ENCOUNTER — Ambulatory Visit (INDEPENDENT_AMBULATORY_CARE_PROVIDER_SITE_OTHER): Payer: 59 | Admitting: Nurse Practitioner

## 2015-07-06 VITALS — BP 122/90 | HR 87 | Temp 97.7°F | Resp 18 | Ht 68.0 in | Wt 244.0 lb

## 2015-07-06 DIAGNOSIS — E785 Hyperlipidemia, unspecified: Secondary | ICD-10-CM

## 2015-07-06 DIAGNOSIS — E119 Type 2 diabetes mellitus without complications: Secondary | ICD-10-CM | POA: Diagnosis not present

## 2015-07-06 DIAGNOSIS — I10 Essential (primary) hypertension: Secondary | ICD-10-CM

## 2015-07-06 DIAGNOSIS — E669 Obesity, unspecified: Secondary | ICD-10-CM | POA: Diagnosis not present

## 2015-07-06 DIAGNOSIS — E1169 Type 2 diabetes mellitus with other specified complication: Secondary | ICD-10-CM

## 2015-07-06 MED ORDER — EMPAGLIFLOZIN 25 MG PO TABS: 25.0000 mg | ORAL_TABLET | Freq: Every day | ORAL | Status: DC

## 2015-07-06 NOTE — Progress Notes (Signed)
Patient ID: Kyle Mcclure, male   DOB: Apr 14, 1961, 56 y.o.   MRN: [id number]    PCP: [name], NP   Allergies  Allergen Reactions  . Hydrocodone Nausea And Vomiting  . Oxycodone Nausea And Vomiting    Chief Complaint  Patient presents with  . Medical Management of Chronic Issues    6 month follow-up for DM, Hypertension,Hyperlipidemia     HPI: Patient is a 55 y.o. male seen in the office today for routine follow up on chronic conditions. Pt with a hx of dm, htn, hyperlipidemia, seasonal allergies. Following with Orlene Plum D for DM.  Taking jardiance, glipizide, metformin and tradjenta for diabetes. Blood sugars 70-110 fasting.  conts on lipitor 10 mg for cholesterol Walks daily 30 mins twice daily Attempts diabetic diet. Goal is to get in better shape Reports recently went to eye doctor- vision improved Denies neuropathy No changes to bowel or bladder  Review of Systems:  Review of Systems  Constitutional: Negative for activity change, appetite change, fatigue and unexpected weight change.  HENT: Negative for congestion and hearing loss.   Eyes: Negative.   Respiratory: Negative for cough and shortness of breath.   Cardiovascular: Negative for chest pain, palpitations and leg swelling.  Gastrointestinal: Negative for abdominal pain, diarrhea and constipation.  Genitourinary: Negative for dysuria and difficulty urinating.  Musculoskeletal: Negative for myalgias and arthralgias.  Skin: Negative for color change and wound.  Neurological: Negative for dizziness and weakness.  Psychiatric/Behavioral: Negative for behavioral problems, confusion and agitation.    Past Medical History  Diagnosis Date  . Hypertension   . Diabetes mellitus without complication (HCC)     type 2  . Hyperlipidemia   . History of kidney stones   . Seasonal allergies    Past Surgical History  Procedure Laterality Date  . Wisdomteeth extraction    . Tonsillectomy    .  Lithotripsy  12/2012   Social History:   reports that he quit smoking about 25 years ago. His smoking use included Cigarettes. He has a 1.5 pack-year smoking history. He has never used smokeless tobacco. He reports that he drinks about 2.4 oz of alcohol per week. He reports that he does not use illicit drugs.  Family History  Problem Relation Age of Onset  . Colon cancer Neg Hx   . Rectal cancer Neg Hx   . Stomach cancer Neg Hx   . Esophageal cancer Neg Hx     Medications: Patient's Medications  New Prescriptions   No medications on file  Previous Medications   ASPIRIN 81 MG TABLET    Take 81 mg by mouth daily.   ATORVASTATIN (LIPITOR) 10 MG TABLET    TAKE 1 TABLET (10 MG TOTAL) BY MOUTH DAILY.   CETIRIZINE (ZYRTEC) 10 MG TABLET    Take 10 mg by mouth daily.   EMPAGLIFLOZIN (JARDIANCE) 25 MG TABS TABLET    Take 25 mg by mouth daily.   FLUTICASONE (FLONASE) 50 MCG/ACT NASAL SPRAY    Place 1 spray into both nostrils as needed for allergies or rhinitis.   GLIPIZIDE (GLUCOTROL) 10 MG TABLET    TAKE 1 TABLET BY MOUTH TWO TIMES DAILY BEFORE A MEAL FOR DIABETES   GLUCOSE BLOOD (ONE TOUCH TEST STRIPS) TEST STRIP    Check blood sugar 2 x daily as directed DX: 250.02   LANCET DEVICES (ONE TOUCH DELICA LANCING DEV) MISC    by Does not apply route. Check blood sugar 2 x daily as  directed DX: 250.02   LISINOPRIL (PRINIVIL,ZESTRIL) 10 MG TABLET    TAKE 1 TABLET BY MOUTH EVERY DAY TO CONTROL BLOOD PRESSURE   METFORMIN (GLUCOPHAGE) 500 MG TABLET    TAKE 2 TABLETS BY MOUTH TWICE A DAY WITH A MEAL TO CONTROL BLOOD SUGAR   POTASSIUM CITRATE (UROCIT-K) 10 MEQ (1080 MG) SR TABLET    2 by mouth once daily to prevent kidney stones   TRADJENTA 5 MG TABS TABLET    TAKE 1 TABLET BY MOUTH EVERY DAY  Modified Medications   No medications on file  Discontinued Medications   No medications on file     Physical Exam:  Filed Vitals:   07/06/15 0843  Pulse: 87  Temp: 97.7 F (36.5 C)  TempSrc: Oral  Resp:  18  Height:  (1.727 m)  Weight: 244 lb (110.678 kg)  SpO2: 97%   Body mass index is 37.11 kg/(m^2).  Physical Exam  Constitutional: He is oriented to person, place, and time. He appears well-developed and well-nourished. No distress.  HENT:  Head: Normocephalic and atraumatic.  Mouth/Throat: Oropharynx is clear and moist. No oropharyngeal exudate.  Eyes: Conjunctivae and EOM are normal. Pupils are equal, round, and reactive to light.  Neck: Normal range of motion. Neck supple.  Cardiovascular: Normal rate, regular rhythm and normal heart sounds.   Pulmonary/Chest: Effort normal and breath sounds normal.  Abdominal: Soft. Bowel sounds are normal.  Musculoskeletal: He exhibits no edema or tenderness.  Neurological: He is alert and oriented to person, place, and time.  Skin: Skin is warm and dry. He is not diaphoretic.  Psychiatric: He has a normal mood and affect.    Labs reviewed: Basic Metabolic Panel:  Recent Labs  40/98/11 1205 01/03/15 1156 05/18/15 0829  NA 137 140 139  K 4.4 4.4 4.5  CL 96* 100 95*  CO2 GLUCOSE 213* 141* 122*  BUN CREATININE 0.96 0.87 1.04  CALCIUM 9.4 9.7 9.7   Liver Function Tests:  Recent Labs  09/29/14 1205 01/03/15 1156 05/18/15 0829  AST 63* 32 39  ALT 65* 44 61*  ALKPHOS 93 82 95  BILITOT 0.9 0.4 0.9  PROT 7.2 6.9 7.7  ALBUMIN 4.5 4.4 4.9   No results for input(s): LIPASE, AMYLASE in the last 8760 hours. No results for input(s): AMMONIA in the last 8760 hours. CBC: No results for input(s): WBC, NEUTROABS, HGB, HCT, MCV, PLT in the last 8760 hours. Lipid Panel: No results for input(s): CHOL, HDL, LDLCALC, TRIG, CHOLHDL, LDLDIRECT in the last 8760 hours. TSH: No results for input(s): TSH in the last 8760 hours. A1C: Lab Results  Component Value Date   HGBA1C 6.7* 05/18/2015     Assessment/Plan 1. Diabetes mellitus type 2 in obese (HCC) Good glycemic control and A1c at goal in December To cont  current regimen with lifestyle modifications.  - CBC with Differential/Platelet; Future  2. Essential hypertension Blood pressure well controlled, conts on lisinopril   3. Hyperlipidemia LDL goal <100 -conts on Lipitor, not fasting today, will get fasting lipid panel with next labs - Lipid panel; Future  4. Obesity, mild -goal to get into better shape with weight loss, pt  to increase exercise -cont diabetic diet.   Follow up in 6 months for annual exam Mellany Dinsmore K. Biagio Borg  [facility] [phone] 8 am - 5 pm) [phone] (after hours)

## 2015-07-06 NOTE — Patient Instructions (Signed)
Cont current medications  Keep lab and appt with Cathey  Follow up in 6 months for annual exam

## 2015-07-21 ENCOUNTER — Other Ambulatory Visit: Payer: Self-pay | Admitting: Nurse Practitioner

## 2015-08-18 ENCOUNTER — Other Ambulatory Visit: Payer: Self-pay | Admitting: Nurse Practitioner

## 2015-09-20 ENCOUNTER — Other Ambulatory Visit: Payer: Self-pay | Admitting: *Deleted

## 2015-09-20 ENCOUNTER — Telehealth: Payer: Self-pay | Admitting: *Deleted

## 2015-09-20 NOTE — Telephone Encounter (Signed)
Patient called regarding having a PSA added to his lab work that he will be doing on Friday. Please Advise!

## 2015-09-20 NOTE — Telephone Encounter (Signed)
It's fine if he can give a reason why he wants the PSA done.

## 2015-09-21 ENCOUNTER — Other Ambulatory Visit: Payer: Self-pay | Admitting: *Deleted

## 2015-09-21 DIAGNOSIS — N400 Enlarged prostate without lower urinary tract symptoms: Secondary | ICD-10-CM

## 2015-09-22 ENCOUNTER — Other Ambulatory Visit: Payer: 59

## 2015-09-22 ENCOUNTER — Other Ambulatory Visit: Payer: Self-pay | Admitting: *Deleted

## 2015-09-22 DIAGNOSIS — N400 Enlarged prostate without lower urinary tract symptoms: Secondary | ICD-10-CM

## 2015-09-22 DIAGNOSIS — I10 Essential (primary) hypertension: Secondary | ICD-10-CM

## 2015-09-22 DIAGNOSIS — E669 Obesity, unspecified: Principal | ICD-10-CM

## 2015-09-22 DIAGNOSIS — E1169 Type 2 diabetes mellitus with other specified complication: Secondary | ICD-10-CM

## 2015-09-22 DIAGNOSIS — E785 Hyperlipidemia, unspecified: Secondary | ICD-10-CM

## 2015-09-23 LAB — CBC WITH DIFFERENTIAL/PLATELET
BASOS ABS: 0.1 10*3/uL (ref 0.0–0.2)
Basos: 1 %
EOS (ABSOLUTE): 0.2 10*3/uL (ref 0.0–0.4)
Eos: 3 %
HEMOGLOBIN: 15.4 g/dL (ref 12.6–17.7)
Hematocrit: 45.8 % (ref 37.5–51.0)
Immature Grans (Abs): 0 10*3/uL (ref 0.0–0.1)
Immature Granulocytes: 0 %
LYMPHS ABS: 2.6 10*3/uL (ref 0.7–3.1)
Lymphs: 30 %
MCH: 29.6 pg (ref 26.6–33.0)
MCHC: 33.6 g/dL (ref 31.5–35.7)
MCV: 88 fL (ref 79–97)
Monocytes Absolute: 0.5 10*3/uL (ref 0.1–0.9)
Monocytes: 6 %
NEUTROS ABS: 5.2 10*3/uL (ref 1.4–7.0)
Neutrophils: 60 %
PLATELETS: 238 10*3/uL (ref 150–379)
RBC: 5.2 x10E6/uL (ref 4.14–5.80)
RDW: 13.7 % (ref 12.3–15.4)
WBC: 8.6 10*3/uL (ref 3.4–10.8)

## 2015-09-23 LAB — COMPREHENSIVE METABOLIC PANEL
ALT: 51 IU/L — ABNORMAL HIGH (ref 0–44)
AST: 38 IU/L (ref 0–40)
Albumin/Globulin Ratio: 1.7 (ref 1.2–2.2)
Albumin: 4.5 g/dL (ref 3.5–5.5)
Alkaline Phosphatase: 82 IU/L (ref 39–117)
BUN/Creatinine Ratio: 14 (ref 9–20)
BUN: 15 mg/dL (ref 6–24)
Bilirubin Total: 0.5 mg/dL (ref 0.0–1.2)
CO2: 20 mmol/L (ref 18–29)
Calcium: 9.4 mg/dL (ref 8.7–10.2)
Chloride: 100 mmol/L (ref 96–106)
Creatinine, Ser: 1.05 mg/dL (ref 0.76–1.27)
GFR calc Af Amer: 93 mL/min/{1.73_m2} (ref >59)
GFR calc non Af Amer: 80 mL/min/{1.73_m2} (ref >59)
Globulin, Total: 2.6 g/dL (ref 1.5–4.5)
Glucose: 135 mg/dL — ABNORMAL HIGH (ref 65–99)
Potassium: 4.6 mmol/L (ref 3.5–5.2)
Sodium: 142 mmol/L (ref 134–144)
Total Protein: 7.1 g/dL (ref 6.0–8.5)

## 2015-09-23 LAB — LIPID PANEL
CHOLESTEROL TOTAL: 142 mg/dL (ref 100–199)
Chol/HDL Ratio: 5.5 ratio units — ABNORMAL HIGH (ref 0.0–5.0)
HDL: 26 mg/dL — ABNORMAL LOW (ref >39)
LDL Calculated: 56 mg/dL (ref 0–99)
TRIGLYCERIDES: 300 mg/dL — AB (ref 0–149)
VLDL Cholesterol Cal: 60 mg/dL — ABNORMAL HIGH (ref 5–40)

## 2015-09-23 LAB — HEMOGLOBIN A1C
Est. average glucose Bld gHb Est-mCnc: 163 mg/dL
Hgb A1c MFr Bld: 7.3 % — ABNORMAL HIGH (ref 4.8–5.6)

## 2015-09-23 LAB — PSA: Prostate Specific Ag, Serum: 0.2 ng/mL (ref 0.0–4.0)

## 2015-09-25 ENCOUNTER — Encounter: Payer: Self-pay | Admitting: Pharmacotherapy

## 2015-09-25 ENCOUNTER — Ambulatory Visit (INDEPENDENT_AMBULATORY_CARE_PROVIDER_SITE_OTHER): Payer: 59 | Admitting: Pharmacotherapy

## 2015-09-25 VITALS — BP 138/86 | HR 85 | Temp 98.2°F | Ht 68.0 in | Wt 250.0 lb

## 2015-09-25 DIAGNOSIS — E119 Type 2 diabetes mellitus without complications: Secondary | ICD-10-CM | POA: Diagnosis not present

## 2015-09-25 DIAGNOSIS — I10 Essential (primary) hypertension: Secondary | ICD-10-CM

## 2015-09-25 DIAGNOSIS — E785 Hyperlipidemia, unspecified: Secondary | ICD-10-CM

## 2015-09-25 DIAGNOSIS — E669 Obesity, unspecified: Secondary | ICD-10-CM | POA: Diagnosis not present

## 2015-09-25 DIAGNOSIS — E1169 Type 2 diabetes mellitus with other specified complication: Secondary | ICD-10-CM

## 2015-09-25 NOTE — Progress Notes (Signed)
  Subjective:    Kyle Mcclure is a 55 y.o.white male who presents for follow-up of Type 2 diabetes mellitus.   A1C up to 7.3% (was 6.7%) LDL at goal - 56; however, TG too high at 300  Currently has nasal congestion, cough.  Duration - 24 hours.  No fever today, he says he had one yesterday.  He is not taking anything other than Flonase.  Did not bring blood glucose meter. Self reports BG 130-150 range. No hypoglycemia  He says "pretty good" food choices.  Not skipping meals. Walking for exercise daily. Denies problems with vision.  Has eye exam tomorrow - Dr. Laruth Mcclure's office. Denies problems with feet. Denies peripheral edema. Nocturia x 1 each night No dysuria. Staying well hydrated.   Review of Systems A comprehensive review of systems was negative except for: Constitutional: positive for fatigue Ears, nose, mouth, throat, and face: positive for nasal congestion Respiratory: positive for cough Genitourinary: positive for nocturia    Objective:    BP 138/86 mmHg  Pulse 85  Temp(Src) 98.2 F (36.8 C) (Oral)  Ht 5\' 8"  (1.727 m)  Wt 250 lb (113.399 kg)  BMI 38.02 kg/m2  SpO2 95%  General:  alert, cooperative and fatigued  Oropharynx: normal findings: lips normal without lesions and gums healthy and abnormal findings: tender sinus area   Eyes:  negative findings: lids and lashes normal and conjunctivae and sclerae normal   Ears:  external ears normal        Lung: clear to auscultation bilaterally  Heart:  regular rate and rhythm     Extremities: no edema, redness or tenderness in the calves or thighs  Skin: warm and dry, no hyperpigmentation, vitiligo, or suspicious lesions     Neuro: mental status, speech normal, alert and oriented x3 and gait and station normal   Lab Review GLUCOSE (mg/dL)  Date Value  16/10/960404/21/2017 135*  05/18/2015 122*  01/03/2015 141*   GLUCOSE, BLD (mg/dL)  Date Value  54/09/811905/29/2014 344*   CO2 (mmol/L)  Date Value  09/22/2015 20   05/18/2015 25  01/03/2015 22   BUN (mg/dL)  Date Value  14/78/295604/21/2017 15  05/18/2015 19  01/03/2015 12  10/29/2012 15   CREATININE, SER (mg/dL)  Date Value  21/30/865704/21/2017 1.05  05/18/2015 1.04  01/03/2015 0.87       Assessment:    Diabetes Mellitus type II, under good control.  A1C slightly above target <7% BP at goal <140/90 LDL at goal <100 TG above goal <150   Plan:    1.  Rx changes: none 2.  Continue Jardiance 25mg  daily. 3.  Continue Tradjenta 5mg  daily 4.  Continue metformin and glipizide. 5.  Counseled on importance of good nutrition habits.  Suspect higher BG and TG related to dietary indiscretion.   6.  Counseled on importance of routine exercise.  Goal is 30-45 minutes 5 x week. 7.  For viral sinusitis symptoms - Mucinex DM every 12 hours (with 8-10 ounces of H2O) and Flonase.  Call if symptoms not improved in 1 week. 8.  Continue atorvastatin 10mg  daily 9.  BP at goal <140/90/

## 2015-09-25 NOTE — Patient Instructions (Signed)
Mucinex DM every 12 hours with 8-10 ounces of water. Continue to use Flonase Call if symptoms not better in 1 week.

## 2015-09-26 ENCOUNTER — Encounter: Payer: Self-pay | Admitting: *Deleted

## 2015-09-26 LAB — HM DIABETES EYE EXAM

## 2015-10-25 ENCOUNTER — Other Ambulatory Visit: Payer: Self-pay | Admitting: Nurse Practitioner

## 2015-11-28 ENCOUNTER — Other Ambulatory Visit: Payer: Self-pay | Admitting: Nurse Practitioner

## 2015-12-26 NOTE — Addendum Note (Signed)
Addended by: Lodema Hong MESHELL A on: 12/26/2015 04:08 PM   Modules accepted: Orders

## 2016-01-03 ENCOUNTER — Other Ambulatory Visit: Payer: 59

## 2016-01-03 DIAGNOSIS — E1169 Type 2 diabetes mellitus with other specified complication: Secondary | ICD-10-CM

## 2016-01-03 DIAGNOSIS — E669 Obesity, unspecified: Principal | ICD-10-CM

## 2016-01-03 LAB — COMPREHENSIVE METABOLIC PANEL
ALT: 56 U/L — ABNORMAL HIGH (ref 9–46)
AST: 39 U/L — ABNORMAL HIGH (ref 10–35)
Albumin: 4.2 g/dL (ref 3.6–5.1)
Alkaline Phosphatase: 80 U/L (ref 40–115)
BUN: 13 mg/dL (ref 7–25)
CO2: 23 mmol/L (ref 20–31)
Calcium: 9.5 mg/dL (ref 8.6–10.3)
Chloride: 104 mmol/L (ref 98–110)
Creat: 0.99 mg/dL (ref 0.70–1.33)
Glucose, Bld: 130 mg/dL — ABNORMAL HIGH (ref 65–99)
Potassium: 4.4 mmol/L (ref 3.5–5.3)
Sodium: 139 mmol/L (ref 135–146)
Total Bilirubin: 0.7 mg/dL (ref 0.2–1.2)
Total Protein: 7.1 g/dL (ref 6.1–8.1)

## 2016-01-03 LAB — HEMOGLOBIN A1C
Hgb A1c MFr Bld: 7.1 % — ABNORMAL HIGH (ref <5.7)
Mean Plasma Glucose: 157 mg/dL

## 2016-01-04 LAB — MICROALBUMIN / CREATININE URINE RATIO
Creatinine, Urine: 119 mg/dL (ref 20–370)
Microalb Creat Ratio: 12 mcg/mg creat (ref <30)
Microalb, Ur: 1.4 mg/dL

## 2016-01-09 ENCOUNTER — Ambulatory Visit (INDEPENDENT_AMBULATORY_CARE_PROVIDER_SITE_OTHER): Payer: 59 | Admitting: Nurse Practitioner

## 2016-01-09 ENCOUNTER — Encounter: Payer: Self-pay | Admitting: Nurse Practitioner

## 2016-01-09 VITALS — BP 128/90 | HR 84 | Temp 98.1°F | Resp 17 | Ht 69.4 in | Wt 248.6 lb

## 2016-01-09 DIAGNOSIS — N400 Enlarged prostate without lower urinary tract symptoms: Secondary | ICD-10-CM | POA: Diagnosis not present

## 2016-01-09 DIAGNOSIS — Z0000 Encounter for general adult medical examination without abnormal findings: Secondary | ICD-10-CM

## 2016-01-09 DIAGNOSIS — E669 Obesity, unspecified: Secondary | ICD-10-CM | POA: Diagnosis not present

## 2016-01-09 DIAGNOSIS — N50812 Left testicular pain: Secondary | ICD-10-CM

## 2016-01-09 DIAGNOSIS — Z1159 Encounter for screening for other viral diseases: Secondary | ICD-10-CM | POA: Diagnosis not present

## 2016-01-09 DIAGNOSIS — I10 Essential (primary) hypertension: Secondary | ICD-10-CM | POA: Diagnosis not present

## 2016-01-09 DIAGNOSIS — E119 Type 2 diabetes mellitus without complications: Secondary | ICD-10-CM

## 2016-01-09 DIAGNOSIS — E1169 Type 2 diabetes mellitus with other specified complication: Secondary | ICD-10-CM

## 2016-01-09 NOTE — Progress Notes (Signed)
Patient Care Team: [name], NP as PCP - General (Nurse Practitioner) [name], OD as Consulting Physician (Optometry) [name], MD as Consulting Physician (Urology)  Extended Emergency Contact Information Primary Emergency Contact: Jerene Pitch OF D[place] Home Phone: [phone] Relation: Mother Allergies  Allergen Reactions  . Hydrocodone Nausea And Vomiting  . Oxycodone Nausea And Vomiting   Code Status: FULL Goals of Care: Advanced Directive information Advanced Directives 01/09/2016  Does patient have an advance directive? Yes  Type of Advance Directive Living will;Healthcare Power of Attorney  Copy of advanced directive(s) in chart? No - copy requested  Would patient like information on creating an advanced directive? -     Chief Complaint  Patient presents with  . Medical Management of Chronic Issues    complete physical. Labs printed.     HPI: Patient is a 55 y.o. male seen in today for an annual wellness exam.  Has been doing well in the last year. No major illness or hospitalizations.    Depression screen [facility] 2/9 01/09/2016 11/21/2014 09/29/2014 04/05/2014 06/15/2013  Decreased Interest 0 0 0 0 0  Down, Depressed, Hopeless 0 0 0 0 0  PHQ - 2 Score 0 0 0 0 0    Fall Risk  01/09/2016 09/25/2015 07/06/2015 01/23/2015 01/03/2015  Falls in the past year? No No No No No   No flowsheet data found.   Health Maintenance  Topic Date Due  . Hepatitis C Screening  1960/07/30  . HIV Screening  04/30/1976  . INFLUENZA VACCINE  01/02/2016  . HEMOGLOBIN A1C  07/05/2016  . OPHTHALMOLOGY EXAM  09/25/2016  . FOOT EXAM  01/08/2017  . TETANUS/TDAP  06/03/2018  . PNEUMOCOCCAL POLYSACCHARIDE VACCINE (2) 12/22/2018  . COLONOSCOPY  03/12/2025    Exercise? Walking 60 mins 4 days a week  Diet? Attempt diabetic diet  Dentition: dentist q  6 months- Dr Lorin Picket Minor  Pain: has kidney stones, left testes with increase pain last  week but this has gone away  colonoscopy 10 years ago, next in 10 years Past Medical History:  Diagnosis Date  . Diabetes mellitus without complication (HCC)    type 2  . History of kidney stones   . Hyperlipidemia   . Hypertension   . Seasonal allergies     Past Surgical History:  Procedure Laterality Date  . LITHOTRIPSY  12/2012  . TONSILLECTOMY    . wisdomteeth extraction      Social History   Social History  . Marital status: Single    Spouse name: N/A  . Number of children: N/A  . Years of education: N/A   Social History Main Topics  . Smoking status: Former Smoker    Packs/day: 0.50    Years: 3.00    Types: Cigarettes    Quit date: 06/03/1990  . Smokeless tobacco: Never Used  . Alcohol use 2.4 oz/week    4 Standard drinks or equivalent per week  . Drug use: No  . Sexual activity: Not Asked   Other Topics Concern  . None   Social History Narrative  . None    Family History  Problem Relation Age of Onset  . Colon cancer Neg Hx   . Rectal cancer Neg Hx   . Stomach cancer Neg Hx   . Esophageal cancer Neg Hx     Review of Systems:  Review of Systems  Constitutional: Negative for  activity change, appetite change, fatigue and unexpected weight change.  HENT: Negative for hearing loss.   Eyes: Negative.   Respiratory: Negative for cough and shortness of breath.   Cardiovascular: Negative for chest pain, palpitations and leg swelling.  Gastrointestinal: Negative for abdominal pain, constipation and diarrhea.  Genitourinary: Positive for testicular pain (left teste, no pain currently). Negative for difficulty urinating and dysuria.       Hx of kidney stones, pain in lower back and pain with voiding- last episode 2 weeks ago Getting up once nightly to urinate   Musculoskeletal: Negative for arthralgias and myalgias.  Skin: Negative for color change and wound.  Neurological: Negative for dizziness and weakness.  Psychiatric/Behavioral: Negative for  agitation, behavioral problems and confusion.       Medication List       Accurate as of 01/09/16 11:52 AM. Always use your most recent med list.          aspirin 81 MG tablet Take 81 mg by mouth daily.   atorvastatin 10 MG tablet Commonly known as:  LIPITOR TAKE 1 TABLET (10 MG TOTAL) BY MOUTH DAILY.   cetirizine 10 MG tablet Commonly known as:  ZYRTEC Take 10 mg by mouth daily.   empagliflozin 25 MG Tabs tablet Commonly known as:  JARDIANCE Take 25 mg by mouth daily.   fluticasone 50 MCG/ACT nasal spray Commonly known as:  FLONASE Place 1 spray into both nostrils as needed for allergies or rhinitis.   glipiZIDE 10 MG tablet Commonly known as:  GLUCOTROL TAKE 1 TABLET BY MOUTH TWO TIMES DAILY BEFORE A MEAL FOR DIABETES   glucose blood test strip Commonly known as:  ONE TOUCH TEST STRIPS Check blood sugar 2 x daily as directed DX: 250.02   ketorolac 10 MG tablet Commonly known as:  TORADOL Take 10 mg by mouth every 6 (six) hours as needed.   lisinopril 10 MG tablet Commonly known as:  PRINIVIL,ZESTRIL TAKE 1 TABLET BY MOUTH EVERY DAY TO CONTROL BLOOD PRESSURE   metFORMIN 500 MG tablet Commonly known as:  GLUCOPHAGE TAKE 2 TABLETS BY MOUTH TWICE A DAY WITH A MEAL TO CONTROL BLOOD SUGAR   ONE TOUCH DELICA LANCING DEV Misc by Does not apply route. Check blood sugar 2 x daily as directed DX: 250.02   potassium citrate 10 MEQ (1080 MG) SR tablet Commonly known as:  UROCIT-K 2 by mouth once daily to prevent kidney stones   tamsulosin 0.4 MG Caps capsule Commonly known as:  FLOMAX Take 0.4 mg by mouth daily.   TRADJENTA 5 MG Tabs tablet Generic drug:  linagliptin TAKE 1 TABLET BY MOUTH EVERY DAY         Physical Exam: Vitals:   01/09/16 1039  BP: 128/90  Pulse: 84  Resp: 17  Temp: 98.1 F (36.7 C)  TempSrc: Oral  SpO2: 97%  Weight: 248 lb 9.6 oz (112.8 kg)  Height: 5' 9.4" (1.763 m)   Body mass index is 36.29 kg/m. Physical Exam    Constitutional: He is oriented to person, place, and time. He appears well-developed and well-nourished. No distress.  HENT:  Head: Normocephalic and atraumatic.  Mouth/Throat: Oropharynx is clear and moist. No oropharyngeal exudate.  Eyes: Conjunctivae and EOM are normal. Pupils are equal, round, and reactive to light.  Neck: Normal range of motion. Neck supple. No thyromegaly present.  Cardiovascular: Normal rate, regular rhythm, normal heart sounds and intact distal pulses.   Pulmonary/Chest: Effort normal and breath sounds normal.  Abdominal:  Soft. Bowel sounds are normal. He exhibits no distension. There is no tenderness.  Obese abdomen   Genitourinary:  Genitourinary Comments: Declines exam  Musculoskeletal: He exhibits no edema or tenderness.  Lymphadenopathy:    He has no cervical adenopathy.  Neurological: He is alert and oriented to person, place, and time.  Skin: Skin is warm and dry. He is not diaphoretic.  Psychiatric: He has a normal mood and affect.    Labs reviewed: Basic Metabolic Panel:  Recent Labs  16/10/96 0829 09/22/15 0830 01/03/16 0849  NA 139 142 139  K 4.5 4.6 4.4  CL 95* 100 104  CO2 GLUCOSE 122* 135* 130*  BUN CREATININE 1.04 1.05 0.99  CALCIUM 9.7 9.4 9.5   Liver Function Tests:  Recent Labs  05/18/15 0829 09/22/15 0830 01/03/16 0849  AST 39 38 39*  ALT 61* 51* 56*  ALKPHOS 95 82 80  BILITOT 0.9 0.5 0.7  PROT 7.7 7.1 7.1  ALBUMIN 4.9 4.5 4.2   No results for input(s): LIPASE, AMYLASE in the last 8760 hours. No results for input(s): AMMONIA in the last 8760 hours. CBC:  Recent Labs  09/22/15 0830  WBC 8.6  NEUTROABS 5.2  HCT 45.8  MCV 88  PLT 238   Lipid Panel:  Recent Labs  09/22/15 0830  CHOL 142  HDL 26*  LDLCALC 56  TRIG 045*  CHOLHDL 5.5*   Lab Results  Component Value Date   HGBA1C 7.1 (H) 01/03/2016    Procedures: No results found.  Assessment/Plan 1. Essential  hypertension -blood pressure stable, cont lifestyle modifications and current medications  - EKG 12-Lead  2. Diabetes mellitus type 2 in obese [facility]) Following with Edison Pace for diabetes. a1c slightly improved on last labs. Cont current medication and medications   3. Obesity, mild -discussed need for weight loss, plan to get back into the gym to boost exercise. Cont diet modifications   4. BPH (benign prostatic hyperplasia) Stable, conts on flomax. conts to follow up with urology due to kidney stones and BPH  5. Preventative health care No new issues noted.  The patient was counseled regarding the appropriate use of alcohol, regular self-examination of the breasts on a monthly basis, prevention of dental and periodontal disease, diet, regular sustained exercise for at least 30 minutes 5 times per week, testicular self-examination on a monthly basis,smoking cessation, tobacco use,  and recommended schedule for GI hemoccult testing, colonoscopy, cholesterol, thyroid and diabetes screening. -to increase physical activity   6. Need for hepatitis C screening test - Hepatitis C Antibody  7. Testicular pain, left Declines exam, reports this has improved. Feels like it is deferred pain from kidney stone because he has had in the past.  Given warning signs when to seek emergent care or follow up.  Pt aware.   Follow up with [facility] and in 6 months with Dr Corie Chiquito K. Biagio Borg  [facility] [phone] 8 am - 5 pm) [phone] (after hours)

## 2016-01-09 NOTE — Patient Instructions (Signed)

## 2016-01-10 LAB — HEPATITIS C ANTIBODY: HCV Ab: NEGATIVE

## 2016-01-29 ENCOUNTER — Ambulatory Visit (INDEPENDENT_AMBULATORY_CARE_PROVIDER_SITE_OTHER): Payer: 59 | Admitting: Pharmacotherapy

## 2016-01-29 ENCOUNTER — Encounter: Payer: Self-pay | Admitting: Pharmacotherapy

## 2016-01-29 VITALS — BP 120/82 | HR 89 | Temp 97.9°F | Resp 20 | Ht 69.0 in | Wt 245.2 lb

## 2016-01-29 DIAGNOSIS — E119 Type 2 diabetes mellitus without complications: Secondary | ICD-10-CM

## 2016-01-29 DIAGNOSIS — E1169 Type 2 diabetes mellitus with other specified complication: Secondary | ICD-10-CM

## 2016-01-29 DIAGNOSIS — E669 Obesity, unspecified: Secondary | ICD-10-CM | POA: Diagnosis not present

## 2016-01-29 DIAGNOSIS — I10 Essential (primary) hypertension: Secondary | ICD-10-CM

## 2016-01-29 MED ORDER — LISINOPRIL 10 MG PO TABS: 10.0000 mg | ORAL_TABLET | Freq: Every day | ORAL | 3 refills | Status: DC

## 2016-01-29 MED ORDER — METFORMIN HCL 500 MG PO TABS: ORAL_TABLET | ORAL | 5 refills | Status: DC

## 2016-01-29 MED ORDER — GLIPIZIDE 10 MG PO TABS: ORAL_TABLET | ORAL | 5 refills | Status: DC

## 2016-01-29 MED ORDER — LINAGLIPTIN 5 MG PO TABS: 5.0000 mg | ORAL_TABLET | Freq: Every day | ORAL | 5 refills | Status: DC

## 2016-01-29 NOTE — Progress Notes (Signed)
  Subjective:    Kyle Mcclure is a 55 y.o.white male who presents for follow-up of Type 2 diabetes mellitus.   A1C 7.1% (was 7.3%) Microalbumin / Creatinine:  12  Feels good. Forgot to bring blood glucose meter. Says BG are good.  Plans to start going to gym every morning. He is trying to avoid fried foods.  Trying to eat healthier. Denies problems with feet. Denies problems with vision - has allergy eyes "crusty" Nocturia once, if any No dysuria.  Did have a kidney stone last month - passed without problems. Staying well hydrated. (96 ounces / day)   Review of Systems A comprehensive review of systems was negative except for: Eyes: positive for irritation Cardiovascular: positive for lower extremity edema    Objective:    BP 120/82 (BP Location: Left Arm, Patient Position: Sitting, Cuff Size: Large)   Pulse 89   Temp 97.9 F (36.6 C) (Oral)   Resp 20   Ht 5\' 9"  (1.753 m)   Wt 245 lb 3.2 oz (111.2 kg)   SpO2 94%   BMI 36.21 kg/m   General:  alert, cooperative and no distress  Oropharynx: normal findings: lips normal without lesions and gums healthy   Eyes:  negative findings: lids and lashes normal and conjunctivae and sclerae normal   Ears:  external ears normal        Lung: clear to auscultation bilaterally  Heart:  regular rate and rhythm     Extremities: edema trace in lower extremities  Skin: warm and dry, no hyperpigmentation, vitiligo, or suspicious lesions     Neuro: mental status, speech normal, alert and oriented x3 and gait and station normal   Lab Review Glucose (mg/dL)  Date Value  62/95/284104/21/2017 135 (H)  05/18/2015 122 (H)  01/03/2015 141 (H)   Glucose, Bld (mg/dL)  Date Value  32/44/010208/07/2015 130 (H)  10/29/2012 344 (H)   CO2 (mmol/L)  Date Value  01/03/2016 23  09/22/2015 20  05/18/2015 25   BUN (mg/dL)  Date Value  72/53/664408/07/2015 13  09/22/2015 15  05/18/2015 19  01/03/2015 12   Creat (mg/dL)  Date Value  03/47/425908/07/2015 0.99    Creatinine, Ser (mg/dL)  Date Value  56/38/756404/21/2017 1.05  05/18/2015 1.04  01/03/2015 0.87       Assessment:    Diabetes Mellitus type II, under good control.    Plan:    1.  Rx changes: none  2.  Continue Jardiance, Metformin, Tradjenta, and glipizide. 3.  Counseled on need for routine exercise.  Goal is 30-45 minutes 5 x week. 4.  Counseled on nutrition goals.  Do not skip meals. 5.  BP at goal <140/90.

## 2016-03-22 ENCOUNTER — Other Ambulatory Visit: Payer: Self-pay | Admitting: Nurse Practitioner

## 2016-05-02 ENCOUNTER — Other Ambulatory Visit: Payer: 59

## 2016-05-02 DIAGNOSIS — E669 Obesity, unspecified: Principal | ICD-10-CM

## 2016-05-02 DIAGNOSIS — E1169 Type 2 diabetes mellitus with other specified complication: Secondary | ICD-10-CM

## 2016-05-02 LAB — COMPLETE METABOLIC PANEL WITH GFR
ALT: 50 U/L — ABNORMAL HIGH (ref 9–46)
AST: 37 U/L — ABNORMAL HIGH (ref 10–35)
Albumin: 4.1 g/dL (ref 3.6–5.1)
Alkaline Phosphatase: 73 U/L (ref 40–115)
BUN: 17 mg/dL (ref 7–25)
CO2: 25 mmol/L (ref 20–31)
Calcium: 9.4 mg/dL (ref 8.6–10.3)
Chloride: 101 mmol/L (ref 98–110)
Creat: 0.87 mg/dL (ref 0.70–1.33)
GFR, Est African American: >89 mL/min (ref >=60)
GFR, Est Non African American: >89 mL/min (ref >=60)
Glucose, Bld: 146 mg/dL — ABNORMAL HIGH (ref 65–99)
Potassium: 4.3 mmol/L (ref 3.5–5.3)
Sodium: 136 mmol/L (ref 135–146)
Total Bilirubin: 0.8 mg/dL (ref 0.2–1.2)
Total Protein: 6.8 g/dL (ref 6.1–8.1)

## 2016-05-03 LAB — HEMOGLOBIN A1C
Hgb A1c MFr Bld: 7 % — ABNORMAL HIGH (ref <5.7)
Mean Plasma Glucose: 154 mg/dL

## 2016-05-06 ENCOUNTER — Encounter: Payer: Self-pay | Admitting: Pharmacotherapy

## 2016-05-06 ENCOUNTER — Ambulatory Visit (INDEPENDENT_AMBULATORY_CARE_PROVIDER_SITE_OTHER): Payer: 59 | Admitting: Pharmacotherapy

## 2016-05-06 VITALS — BP 132/84 | HR 72 | Temp 97.6°F | Ht 69.0 in | Wt 248.0 lb

## 2016-05-06 DIAGNOSIS — E669 Obesity, unspecified: Secondary | ICD-10-CM

## 2016-05-06 DIAGNOSIS — E1169 Type 2 diabetes mellitus with other specified complication: Secondary | ICD-10-CM | POA: Diagnosis not present

## 2016-05-06 DIAGNOSIS — I10 Essential (primary) hypertension: Secondary | ICD-10-CM

## 2016-05-06 NOTE — Patient Instructions (Signed)
RX given for labs - go to [facility] in 3 months before next appointment.

## 2016-05-06 NOTE — Progress Notes (Signed)
  Subjective:    Kyle Mcclure is a 55 y.o.white male who presents for follow-up of Type 2 diabetes mellitus.   A1C 7.0% (was 7.1%)  His insurance [facility]) will not recognize our lab [facility]) as of 2018  Has been sciatic pain - has been taking Aleve and seeing a chiropractor.  Now able to walk without a limp. Exercise has been limited. He is trying to make healthy choices.  Eating more frequent, smaller meals.  Denies problems with vision. Denies problems with feet. Denies peripheral edema. Nocturia 0-1 times per night. He is staying well hydrated with water. No dysuria.   Review of Systems A comprehensive review of systems was negative except for: Ears, nose, mouth, throat, and face: positive for nasal congestion Genitourinary: positive for nocturia Musculoskeletal: positive for back pain and sciatica pain    Objective:    BP 132/84   Pulse 72   Temp 97.6 F (36.4 C) (Oral)   Ht 5' 9"$  (1.753 m)   Wt 248 lb (112.5 kg)   SpO2 97% Comment: room air  BMI 36.62 kg/m   General:  alert, cooperative and no distress  Oropharynx: normal findings: lips normal without lesions and gums healthy   Eyes:  negative findings: lids and lashes normal and conjunctivae and sclerae normal   Ears:  external ears normal        Lung: clear to auscultation bilaterally  Heart:  regular rate and rhythm     Extremities: no edema, redness or tenderness in the calves or thighs  Skin: warm and dry, no hyperpigmentation, vitiligo, or suspicious lesions     Neuro: mental status, speech normal, alert and oriented x3 and gait and station normal   Lab Review Glucose (mg/dL)  Date Value  09/22/2015 135 (H)  05/18/2015 122 (H)  01/03/2015 141 (H)   Glucose, Bld (mg/dL)  Date Value  05/02/2016 146 (H)  01/03/2016 130 (H)  10/29/2012 344 (H)   CO2 (mmol/L)  Date Value  05/02/2016 25  01/03/2016 23  09/22/2015 20   BUN (mg/dL)  Date Value  05/02/2016 17  01/03/2016 13  09/22/2015 15   05/18/2015 19  01/03/2015 12   Creat (mg/dL)  Date Value  05/02/2016 0.87  01/03/2016 0.99   Creatinine, Ser (mg/dL)  Date Value  09/22/2015 1.05  05/18/2015 1.04  01/03/2015 0.87       Assessment:    Diabetes Mellitus type II, under good control.  A1C at 7.0% BP at goal <140/90   Plan:    1.  Rx changes: none  2.  Continue glipizide, metformin, Tradjenta, and Jardiance. 3.  Counseled on benefit of routine exercise.  Goal is 30-45 minutes 5 x week. 4.  Counseled on nutrition goals. 5.  BP at goal <140/90. 6   RX written for labs to go to [place] in 3 months.

## 2016-07-04 ENCOUNTER — Other Ambulatory Visit: Payer: Self-pay

## 2016-07-04 MED ORDER — LISINOPRIL 10 MG PO TABS: 10.0000 mg | ORAL_TABLET | Freq: Every day | ORAL | 1 refills | Status: DC

## 2016-07-10 ENCOUNTER — Encounter: Payer: Self-pay | Admitting: Internal Medicine

## 2016-07-10 ENCOUNTER — Ambulatory Visit (INDEPENDENT_AMBULATORY_CARE_PROVIDER_SITE_OTHER): Payer: 59 | Admitting: Internal Medicine

## 2016-07-10 VITALS — BP 138/94 | HR 79 | Temp 97.7°F | Ht 69.0 in | Wt 248.2 lb

## 2016-07-10 DIAGNOSIS — E1169 Type 2 diabetes mellitus with other specified complication: Secondary | ICD-10-CM

## 2016-07-10 DIAGNOSIS — E785 Hyperlipidemia, unspecified: Secondary | ICD-10-CM | POA: Diagnosis not present

## 2016-07-10 DIAGNOSIS — L259 Unspecified contact dermatitis, unspecified cause: Secondary | ICD-10-CM | POA: Diagnosis not present

## 2016-07-10 DIAGNOSIS — I10 Essential (primary) hypertension: Secondary | ICD-10-CM | POA: Diagnosis not present

## 2016-07-10 DIAGNOSIS — E669 Obesity, unspecified: Secondary | ICD-10-CM

## 2016-07-10 MED ORDER — FLUOCINONIDE 0.05 % EX OINT: 1.0000 "application " | TOPICAL_OINTMENT | Freq: Two times a day (BID) | CUTANEOUS | 3 refills | Status: DC

## 2016-07-10 NOTE — Progress Notes (Signed)
Patient ID: Kyle Mcclure, male   DOB: 06-10-1960, 56 y.o.   MRN: [id number]    Location:  [facility] Place of Service: OFFICE  Chief Complaint  Patient presents with  . Medical Management of Chronic Issues    6 month routine visit    HPI:  56 yo male seen today for f/u. He has a itchy rash on left ring finger x 3 mos. He tried multiple OTC tx icluding lotrimin, hydrocortisone, epsom salt, vasoline etc. Rash initially was blistering but that has resolved. Similar rash on left elbow and posterior neck. 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   He has occasional nausea at night. He does not check BS on a regular basis.   DM with obesity - A1c 7%. Urine microalbumin/Cr ratio 12. LDL 56. BMI 36.65. He takes ACEI, metformin, glipizide, tradjenta and jardiance  HTN - stable on lisinopril. He takes ASA daily  BPH - stable on flomax  Hypokalemia - stable on urocit  Hyperlipidemia - stable on lipitor. LDL 56  Allergic rhinitis - stable on zyrtec  Past Medical History:  Diagnosis Date  . Diabetes mellitus without complication (HCC)    type 2  . History of kidney stones   . Hyperlipidemia   . Hypertension   . Seasonal allergies     Past Surgical History:  Procedure Laterality Date  . LITHOTRIPSY  12/2012  . TONSILLECTOMY    . wisdomteeth extraction      Patient Care Team: [name], NP as PCP - General (Nurse Practitioner) [name], OD as Consulting Physician (Optometry) [name], MD as Consulting Physician (Urology)  Social History   Social History  . Marital status: Single    Spouse name: N/A  . Number of children: N/A  . Years of education: N/A   Occupational History  . Not on file.   Social History Main Topics  . Smoking status: Former Smoker    Packs/day: 0.50    Years: 3.00    Types: Cigarettes    Quit date: 06/03/1990  . Smokeless tobacco: Never Used  . Alcohol use 2.4 oz/week    4  Standard drinks or equivalent per week  . Drug use: No  . Sexual activity: Not on file   Other Topics Concern  . Not on file   Social History Narrative  . No narrative on file     reports that he quit smoking about 26 years ago. His smoking use included Cigarettes. He has a 1.50 pack-year smoking history. He has never used smokeless tobacco. He reports that he drinks about 2.4 oz of alcohol per week . He reports that he does not use drugs.  Family History  Problem Relation Age of Onset  . Colon cancer Neg Hx   . Rectal cancer Neg Hx   . Stomach cancer Neg Hx   . Esophageal cancer Neg Hx    Family Status  Relation Status  . Mother Alive  . Father Alive  . Neg Hx      Allergies  Allergen Reactions  . Hydrocodone Nausea And Vomiting  . Oxycodone Nausea And Vomiting    Medications: Patient's Medications  New Prescriptions   No medications on file  Previous Medications   ASPIRIN 81 MG TABLET    Take 81 mg by mouth daily.   ATORVASTATIN (LIPITOR) 10 MG TABLET    TAKE 1 TABLET (10 MG TOTAL) BY MOUTH DAILY.  CETIRIZINE (ZYRTEC) 10 MG TABLET    Take 10 mg by mouth daily.   EMPAGLIFLOZIN (JARDIANCE) 25 MG TABS TABLET    Take 25 mg by mouth daily.   FLUTICASONE (FLONASE) 50 MCG/ACT NASAL SPRAY    Place 1 spray into both nostrils as needed for allergies or rhinitis.   GLIPIZIDE (GLUCOTROL) 10 MG TABLET    TAKE 1 TABLET BY MOUTH TWO TIMES DAILY BEFORE A MEAL FOR DIABETES   GLUCOSE BLOOD (ONE TOUCH TEST STRIPS) TEST STRIP    Check blood sugar 2 x daily as directed DX: 250.02   KETOROLAC (TORADOL) 10 MG TABLET    Take 10 mg by mouth every 6 (six) hours as needed.   LANCET DEVICES (ONE TOUCH DELICA LANCING DEV) MISC    by Does not apply route. Check blood sugar 2 x daily as directed DX: 250.02   LINAGLIPTIN (TRADJENTA) 5 MG TABS TABLET    Take 1 tablet (5 mg total) by mouth daily.   LISINOPRIL (PRINIVIL,ZESTRIL) 10 MG TABLET    Take 1 tablet (10 mg total) by mouth daily.    METFORMIN (GLUCOPHAGE) 500 MG TABLET    TAKE 2 TABLETS BY MOUTH TWICE A DAY WITH A MEAL TO CONTROL BLOOD SUGAR   POTASSIUM CITRATE (UROCIT-K) 10 MEQ (1080 MG) SR TABLET    2 by mouth once daily to prevent kidney stones   TAMSULOSIN (FLOMAX) 0.4 MG CAPS CAPSULE    Take 0.4 mg by mouth daily.   Modified Medications   No medications on file  Discontinued Medications   No medications on file    Review of Systems  Gastrointestinal: Positive for nausea.  Genitourinary: Positive for difficulty urinating and frequency.  Skin: Positive for rash.  All other systems reviewed and are negative.   Vitals:   07/10/16 0828  BP: (!) 138/94  Pulse: 79  Temp: 97.7 F (36.5 C)  TempSrc: Oral  SpO2: 97%  Weight: 248 lb 3.2 oz (112.6 kg)  Height: 5\' 9"  (1.753 m)   Body mass index is 36.65 kg/m.  Physical Exam  Constitutional: He is oriented to person, place, and time. He appears well-developed and well-nourished.  HENT:  Mouth/Throat: Oropharynx is clear and moist.  Eyes: Pupils are equal, round, and reactive to light. No scleral icterus.  Neck: Neck supple. Carotid bruit is not present. No thyromegaly present.  Cardiovascular: Normal rate, regular rhythm, normal heart sounds and intact distal pulses.  Exam reveals no gallop and no friction rub.   No murmur heard. no distal LE swelling. No calf TTP  Pulmonary/Chest: Effort normal and breath sounds normal. He has no wheezes. He has no rales. He exhibits no tenderness.  Abdominal: Soft. Bowel sounds are normal. He exhibits no distension, no abdominal bruit, no pulsatile midline mass and no mass. There is no hepatomegaly. There is no tenderness. There is no rebound and no guarding.  Lymphadenopathy:    He has no cervical adenopathy.  Neurological: He is alert and oriented to person, place, and time.  Skin: Skin is warm and dry. Rash (left 4th finger with eczematous rash, no vesicular formation. no secondary signs of infectionss. similar rash on  left lateral forearm and posterior base of neck) noted.  Psychiatric: He has a normal mood and affect. His behavior is normal. Judgment and thought content normal.     Labs reviewed: Appointment on 05/02/2016  Component Date Value Ref Range Status  . Hgb A1c MFr Bld 05/02/2016 7.0* <5.7 % Final   Comment:  For someone without known diabetes, a hemoglobin A1c value of 6.5% or greater indicates that they may have diabetes and this should be confirmed with a follow-up test.   For someone with known diabetes, a value <7% indicates that their diabetes is well controlled and a value greater than or equal to 7% indicates suboptimal control. A1c targets should be individualized based on duration of diabetes, age, comorbid conditions, and other considerations.   Currently, no consensus exists for use of hemoglobin A1c for diagnosis of diabetes for children.     . Mean Plasma Glucose 05/02/2016 154  mg/dL Final  . Sodium 16/10/960411/30/2017 136  135 - 146 mmol/L Final  . Potassium 05/02/2016 4.3  3.5 - 5.3 mmol/L Final  . Chloride 05/02/2016 101  98 - 110 mmol/L Final  . CO2 05/02/2016 25  20 - 31 mmol/L Final  . Glucose, Bld 05/02/2016 146* 65 - 99 mg/dL Final  . BUN 54/09/811911/30/2017 17  7 - 25 mg/dL Final  . Creat 14/78/295611/30/2017 0.87  0.70 - 1.33 mg/dL Final   Comment:   For patients > or = 56 years of age: The upper reference limit for Creatinine is approximately 13% higher for people identified as African-American.     . Total Bilirubin 05/02/2016 0.8  0.2 - 1.2 mg/dL Final  . Alkaline Phosphatase 05/02/2016 73  40 - 115 U/L Final  . AST 05/02/2016 37* 10 - 35 U/L Final  . ALT 05/02/2016 50* 9 - 46 U/L Final  . Total Protein 05/02/2016 6.8  6.1 - 8.1 g/dL Final  . Albumin 21/30/865711/30/2017 4.1  3.6 - 5.1 g/dL Final  . Calcium 84/69/629511/30/2017 9.4  8.6 - 10.3 mg/dL Final  . GFR, Est African American 05/02/2016 >89  >=60 mL/min Final  . GFR, Est Non African American 05/02/2016 >89  >=60 mL/min Final    No  results found.   Assessment/Plan   ICD-9-CM ICD-10-CM   1. Contact eczematous dermatitis 692.9 L25.9 fluocinonide ointment (LIDEX) 0.05 %  2. Diabetes mellitus type 2 in obese (HCC) 250.00 E11.69    278.00 E66.9   3. Essential hypertension 401.9 I10   4. Hyperlipidemia LDL goal <100 272.4 E78.5      Check blood sugar when feeling nauseated. May need to adjust meds as needed  May use steroid ointment as needed to rash areas  Use aquaphor ointment to moisturize skin  Continue current medications as ordered  Keep appt with Edison Paceathey Miller as scheduled  Get fasting labs as ordered. Rx written for lipid panel to be drawn with other labs for appt with Edison Paceathey Miller. Insurance will only cover [facility]  Follow up in 6 mos with Shanda BumpsJessica for [employer]    Brycen Bean S. Ancil Linseyarter, D. O., F. A. C. O. I.  [facility] [address] [place], [place] [address] [phone] Cell (Monday-Friday 8 AM - 5 PM) [phone] After 5 PM and follow prompts

## 2016-07-10 NOTE — Patient Instructions (Signed)
May use steroid ointment as needed to rash areas  Use aquaphor ointment to moisturize skin  Continue current medications as ordered  Keep appt with Edison Paceathey Miller as scheduled  Get fasting labs as ordered  Follow up in 6 mos with Shanda BumpsJessica for CPE

## 2016-07-21 ENCOUNTER — Other Ambulatory Visit: Payer: Self-pay | Admitting: Nurse Practitioner

## 2016-08-01 ENCOUNTER — Encounter: Payer: Self-pay | Admitting: Internal Medicine

## 2016-08-01 LAB — BASIC METABOLIC PANEL
BUN: 15 mg/dL (ref 4–21)
Creatinine: 0.9 mg/dL (ref 0.6–1.3)
Glucose: 142 mg/dL
Potassium: 4.3 mmol/L (ref 3.4–5.3)
SODIUM: 137 mmol/L (ref 137–147)

## 2016-08-01 LAB — HEPATIC FUNCTION PANEL
ALT: 70 U/L — AB (ref 10–40)
AST: 50 U/L — AB (ref 14–40)
Alkaline Phosphatase: 93 U/L (ref 25–125)
Bilirubin, Total: 0.7 mg/dL

## 2016-08-01 LAB — LIPID PANEL
CHOLESTEROL: 156 mg/dL (ref 0–200)
HDL: 27 mg/dL — AB (ref 35–70)
LDL CALC: 63 mg/dL
TRIGLYCERIDES: 331 mg/dL — AB (ref 40–160)

## 2016-08-01 LAB — HEMOGLOBIN A1C: Hemoglobin A1C: 7.4

## 2016-08-02 ENCOUNTER — Encounter: Payer: Self-pay | Admitting: *Deleted

## 2016-08-05 ENCOUNTER — Encounter: Payer: Self-pay | Admitting: Pharmacotherapy

## 2016-08-05 ENCOUNTER — Ambulatory Visit (INDEPENDENT_AMBULATORY_CARE_PROVIDER_SITE_OTHER): Payer: 59 | Admitting: Pharmacotherapy

## 2016-08-05 VITALS — BP 122/80 | HR 64 | Resp 12 | Ht 69.0 in | Wt 243.0 lb

## 2016-08-05 DIAGNOSIS — E669 Obesity, unspecified: Secondary | ICD-10-CM | POA: Diagnosis not present

## 2016-08-05 DIAGNOSIS — I10 Essential (primary) hypertension: Secondary | ICD-10-CM

## 2016-08-05 DIAGNOSIS — E1169 Type 2 diabetes mellitus with other specified complication: Secondary | ICD-10-CM | POA: Diagnosis not present

## 2016-08-05 NOTE — Patient Instructions (Signed)
Stop glipizide. Change Jardiance to morning dosing  EXERCISE!

## 2016-08-05 NOTE — Progress Notes (Addendum)
  Subjective:    Kyle Mcclure is a 56 y.o.white male who presents for follow-up of Type 2 diabetes mellitus.   A1C: 7.4% (last A1C: 7.0%) Continues on glipizide, metformin, Tradjenta, Jardiance. Often misses glipizide in the evening. He also missed Jardiance because he is trying to take in the afternoon.  Trying to make healthy food choices. No routine exercise.  Denies problems with vision.  Eye exam is due. Denies problems with feet No peripheral edema. Nocturia 0-1 times per night. Staying well hydrated with water. No dysuria.    Review of Systems A comprehensive review of systems was negative except for: Genitourinary: positive for nocturia    Objective:    BP 122/80   Pulse 64   Resp 12   Ht 5' 9"$  (1.753 m)   Wt 243 lb (110.2 kg)   BMI 35.88 kg/m   General:  alert, cooperative and no distress  Oropharynx: normal findings: lips normal without lesions and gums healthy   Eyes:  negative findings: lids and lashes normal and conjunctivae and sclerae normal   Ears:  external ears normal        Lung: clear to auscultation bilaterally  Heart:  regular rate and rhythm     Extremities: extremities normal, atraumatic, no cyanosis or edema  Skin: dry     Neuro: mental status, speech normal, alert and oriented x3 and gait and station normal   Lab Review Glucose (mg/dL)  Date Value  09/22/2015 135 (H)  05/18/2015 122 (H)  01/03/2015 141 (H)   Glucose, Bld (mg/dL)  Date Value  05/02/2016 146 (H)  01/03/2016 130 (H)  10/29/2012 344 (H)   CO2 (mmol/L)  Date Value  05/02/2016 25  01/03/2016 23  09/22/2015 20   BUN (mg/dL)  Date Value  08/01/2016 15  05/02/2016 17  01/03/2016 13  09/22/2015 15  05/18/2015 19   Creatinine (mg/dL)  Date Value  08/01/2016 0.9   Creat (mg/dL)  Date Value  05/02/2016 0.87  01/03/2016 0.99   Creatinine, Ser (mg/dL)  Date Value  09/22/2015 1.05  05/18/2015 1.04  01/03/2015 0.87       Assessment:    Diabetes  Mellitus type II, under fair control.   A1C above goal <7% BP at goal <130/80   Plan:    1.  Rx changes: Stop glipizide.  Doubt it is adding any benefit at this time.  Change administration time of Jardiance to morning to avoid missed doses.  2.  Continue Metformin and Tradjenta. 3.  Needs to SMBG for accountability. 4.  Counseled on need for routine exercise.  Goal is 30-45 minutes 5 x week. 5.  Counseled on nutrition goals. 6.  BP at goal <130/80. 7.  LDL at goal <70 on atorvastatin.

## 2016-08-21 ENCOUNTER — Other Ambulatory Visit: Payer: Self-pay | Admitting: *Deleted

## 2016-08-21 MED ORDER — LINAGLIPTIN 5 MG PO TABS: 5.0000 mg | ORAL_TABLET | Freq: Every day | ORAL | 5 refills | Status: DC

## 2016-08-21 NOTE — Telephone Encounter (Signed)
[facility]  

## 2016-09-16 ENCOUNTER — Other Ambulatory Visit: Payer: Self-pay | Admitting: *Deleted

## 2016-09-16 MED ORDER — ATORVASTATIN CALCIUM 10 MG PO TABS: ORAL_TABLET | ORAL | 1 refills | Status: DC

## 2016-09-16 NOTE — Telephone Encounter (Signed)
[facility]  

## 2016-09-26 LAB — HM DIABETES EYE EXAM

## 2016-12-19 ENCOUNTER — Other Ambulatory Visit: Payer: Self-pay | Admitting: Nurse Practitioner

## 2016-12-20 ENCOUNTER — Other Ambulatory Visit: Payer: Self-pay | Admitting: Nurse Practitioner

## 2016-12-21 ENCOUNTER — Other Ambulatory Visit: Payer: Self-pay | Admitting: Nurse Practitioner

## 2017-01-16 ENCOUNTER — Other Ambulatory Visit: Payer: Self-pay | Admitting: Internal Medicine

## 2017-01-16 LAB — BASIC METABOLIC PANEL
BUN: 12 (ref 4–21)
CREATININE: 1 (ref 0.6–1.3)
GLUCOSE: 173
POTASSIUM: 4.7 (ref 3.4–5.3)
SODIUM: 140 (ref 137–147)

## 2017-01-16 LAB — HEMOGLOBIN A1C: Hemoglobin A1C: 8.6

## 2017-01-16 LAB — HEPATIC FUNCTION PANEL
ALT: 76 — AB (ref 10–40)
AST: 61 — AB (ref 14–40)
Alkaline Phosphatase: 102 (ref 25–125)
Bilirubin, Total: 0.6

## 2017-01-17 ENCOUNTER — Encounter: Payer: Self-pay | Admitting: *Deleted

## 2017-01-17 LAB — COMPREHENSIVE METABOLIC PANEL
ALT: 76 IU/L — ABNORMAL HIGH (ref 0–44)
AST: 61 IU/L — ABNORMAL HIGH (ref 0–40)
Albumin/Globulin Ratio: 1.5 (ref 1.2–2.2)
Albumin: 4.6 g/dL (ref 3.5–5.5)
Alkaline Phosphatase: 102 IU/L (ref 39–117)
BUN/Creatinine Ratio: 12 (ref 9–20)
BUN: 12 mg/dL (ref 6–24)
Bilirubin Total: 0.6 mg/dL (ref 0.0–1.2)
CO2: 20 mmol/L (ref 20–29)
Calcium: 9.7 mg/dL (ref 8.7–10.2)
Chloride: 101 mmol/L (ref 96–106)
Creatinine, Ser: 0.98 mg/dL (ref 0.76–1.27)
GFR calc Af Amer: 100 mL/min/{1.73_m2} (ref >59)
GFR calc non Af Amer: 86 mL/min/{1.73_m2} (ref >59)
Globulin, Total: 3 g/dL (ref 1.5–4.5)
Glucose: 173 mg/dL — ABNORMAL HIGH (ref 65–99)
Potassium: 4.7 mmol/L (ref 3.5–5.2)
Sodium: 140 mmol/L (ref 134–144)
Total Protein: 7.6 g/dL (ref 6.0–8.5)

## 2017-01-17 LAB — HGB A1C W/O EAG: Hgb A1c MFr Bld: 8.6 % — ABNORMAL HIGH (ref 4.8–5.6)

## 2017-01-20 ENCOUNTER — Encounter: Payer: Self-pay | Admitting: Nurse Practitioner

## 2017-01-20 ENCOUNTER — Ambulatory Visit (INDEPENDENT_AMBULATORY_CARE_PROVIDER_SITE_OTHER): Payer: 59 | Admitting: Nurse Practitioner

## 2017-01-20 VITALS — BP 132/78 | HR 82 | Temp 98.1°F | Resp 18 | Ht 69.0 in | Wt 241.0 lb

## 2017-01-20 DIAGNOSIS — E669 Obesity, unspecified: Secondary | ICD-10-CM

## 2017-01-20 DIAGNOSIS — E785 Hyperlipidemia, unspecified: Secondary | ICD-10-CM

## 2017-01-20 DIAGNOSIS — I10 Essential (primary) hypertension: Secondary | ICD-10-CM | POA: Diagnosis not present

## 2017-01-20 DIAGNOSIS — Z0000 Encounter for general adult medical examination without abnormal findings: Secondary | ICD-10-CM

## 2017-01-20 DIAGNOSIS — E1169 Type 2 diabetes mellitus with other specified complication: Secondary | ICD-10-CM | POA: Diagnosis not present

## 2017-01-20 DIAGNOSIS — R748 Abnormal levels of other serum enzymes: Secondary | ICD-10-CM

## 2017-01-20 MED ORDER — ZOSTER VAC RECOMB ADJUVANTED 50 MCG/0.5ML IM SUSR: 0.5000 mL | Freq: Once | INTRAMUSCULAR | 1 refills | Status: DC

## 2017-01-20 MED ORDER — METFORMIN HCL 1000 MG PO TABS: 1000.0000 mg | ORAL_TABLET | Freq: Two times a day (BID) | ORAL | 3 refills | Status: DC

## 2017-01-20 NOTE — Progress Notes (Addendum)
Provider: [name], NP  Patient Care Team: [name], NP as PCP - General (Nurse Practitioner) Kyle Mcclure, OD as Consulting Physician (Optometry) Kyle Gully, MD as Consulting Physician (Urology)  Extended Emergency Contact Information Primary Emergency Contact: Jerene Pitch OF [place] of [place] Home Phone: [phone] Relation: Mother Allergies  Allergen Reactions  . Hydrocodone Nausea And Vomiting  . Oxycodone Nausea And Vomiting   Code Status: FULL Goals of Care: Advanced Directive information Advanced Directives 01/20/2017  Does Patient Have a Medical Advance Directive? Yes  Type of Estate agent of [place];Living will  Does patient want to make changes to medical advance directive? -  Copy of Healthcare Power of Attorney in Chart? No - copy requested  Would patient like information on creating a medical advance directive? -     Chief Complaint  Patient presents with  . Medical Management of Chronic Issues    Pt is being seen for a complete physical and follow up on chronic conditions. Pt reports no concerns.   . Other    Labs received from [facility]- in blue folder    HPI: Patient is a 56 y.o. male seen in today for an annual wellness exam.   Major illnesses or hospitalization in the last year:none  Depression screen [facility] 2/9 01/20/2017 01/09/2016 11/21/2014 09/29/2014 04/05/2014  Decreased Interest 0 0 0 0 0  Down, Depressed, Hopeless 0 0 0 0 0  PHQ - 2 Score 0 0 0 0 0    Fall Risk  01/20/2017 07/10/2016 01/09/2016 09/25/2015 07/06/2015  Falls in the past year? No No No No No   No flowsheet data found.   Health Maintenance  Topic Date Due  . INFLUENZA VACCINE  01/01/2017  . FOOT EXAM  01/08/2017  . HIV Screening  02/01/2019 (Originally 04/30/1976)  . HEMOGLOBIN A1C  07/19/2017  . OPHTHALMOLOGY EXAM  09/26/2017  . TETANUS/TDAP  06/03/2018  . PNEUMOCOCCAL POLYSACCHARIDE VACCINE (2) 12/22/2018  .  COLONOSCOPY  03/12/2025  . Hepatitis C Screening  Completed    Urinary: following with urologist, appt tomorrow.   Diet?diabetic diet.  Ophthalmology? Yearly in April.  Dentition: every 6 months Pain: sciatica- taking aleve, as needed  Past Medical History:  Diagnosis Date  . Diabetes mellitus without complication (HCC)    type 2  . History of kidney stones   . Hyperlipidemia   . Hypertension   . Seasonal allergies     Past Surgical History:  Procedure Laterality Date  . LITHOTRIPSY  12/2012  . TONSILLECTOMY    . wisdomteeth extraction      Social History   Social History  . Marital status: Single    Spouse name: N/A  . Number of children: N/A  . Years of education: N/A   Social History Main Topics  . Smoking status: Former Smoker    Packs/day: 0.50    Years: 3.00    Types: Cigarettes    Quit date: 06/03/1990  . Smokeless tobacco: Never Used  . Alcohol use 2.4 oz/week    4 Standard drinks or equivalent per week  . Drug use: No  . Sexual activity: Not Asked   Other Topics Concern  . None   Social History Narrative  . None    Family History  Problem Relation Age of Onset  . Colon cancer Neg Hx   . Rectal cancer Neg Hx   . Stomach cancer Neg Hx   .  Esophageal cancer Neg Hx     Review of Systems:  Review of Systems  Constitutional: Negative for activity change, appetite change, fatigue and unexpected weight change.  HENT: Negative for hearing loss.   Eyes: Negative.   Respiratory: Negative for cough and shortness of breath.   Cardiovascular: Negative for chest pain, palpitations and leg swelling.  Gastrointestinal: Negative for abdominal pain, constipation and diarrhea.  Genitourinary: Negative for difficulty urinating and dysuria.       Hx of kidney stones none recently  Getting up once nightly to urinate   Musculoskeletal: Negative for arthralgias and myalgias.  Skin: Negative for color change and wound.  Neurological: Negative for dizziness and  weakness.  Psychiatric/Behavioral: Negative for agitation, behavioral problems and confusion.     Allergies as of 01/20/2017      Reactions   Hydrocodone Nausea And Vomiting   Oxycodone Nausea And Vomiting      Medication List       Accurate as of 01/20/17 11:04 AM. Always use your most recent med list.          aspirin 81 MG tablet Take 81 mg by mouth daily.   atorvastatin 10 MG tablet Commonly known as:  LIPITOR Take one tablet by mouth once daily   cetirizine 10 MG tablet Commonly known as:  ZYRTEC Take 10 mg by mouth daily.   fluticasone 50 MCG/ACT nasal spray Commonly known as:  FLONASE Place 1 spray into both nostrils as needed for allergies or rhinitis.   glucose blood test strip Commonly known as:  ONE TOUCH TEST STRIPS Check blood sugar 2 x daily as directed DX: 250.02   JARDIANCE 25 MG Tabs tablet Generic drug:  empagliflozin TAKE 1 TABLET BY MOUTH EVERY DAY   ketorolac 10 MG tablet Commonly known as:  TORADOL Take 10 mg by mouth every 6 (six) hours as needed.   lisinopril 10 MG tablet Commonly known as:  PRINIVIL,ZESTRIL TAKE 1 TABLET (10 MG TOTAL) BY MOUTH DAILY.   metFORMIN 500 MG tablet Commonly known as:  GLUCOPHAGE TAKE 2 TABLETS BY MOUTH TWICE A DAY WITH A MEAL FOR BLOOD SUGAR   ONE TOUCH DELICA LANCING DEV Misc by Does not apply route. Check blood sugar 2 x daily as directed DX: 250.02   potassium citrate 10 MEQ (1080 MG) SR tablet Commonly known as:  UROCIT-K 2 by mouth once daily to prevent kidney stones   TRADJENTA 5 MG Tabs tablet Generic drug:  linagliptin TAKE 1 TABLET (5 MG TOTAL) BY MOUTH DAILY.   Zoster Vac Recomb Adjuvanted injection Commonly known as:  SHINGRIX Inject 0.5 mLs into the muscle once.         Physical Exam: Vitals:   01/20/17 1021  BP: 132/78  Pulse: 82  Resp: 18  Temp: 98.1 F (36.7 C)  TempSrc: Oral  SpO2: 98%  Weight: 241 lb (109.3 kg)  Height: 5\' 9"  (1.753 m)   Body mass index is 35.59  kg/m. Physical Exam  Constitutional: He is oriented to person, place, and time. He appears well-developed and well-nourished. No distress.  HENT:  Head: Normocephalic and atraumatic.  Mouth/Throat: Oropharynx is clear and moist. No oropharyngeal exudate.  Eyes: Pupils are equal, round, and reactive to light. Conjunctivae and EOM are normal.  Neck: Normal range of motion. Neck supple. No thyromegaly present.  Cardiovascular: Normal rate, regular rhythm, normal heart sounds and intact distal pulses.   Pulmonary/Chest: Effort normal and breath sounds normal.  Abdominal: Soft. Bowel sounds are  normal. He exhibits no distension. There is no tenderness.  Obese abdomen   Musculoskeletal: Normal range of motion. He exhibits no edema or tenderness.  Lymphadenopathy:    He has no cervical adenopathy.  Neurological: He is alert and oriented to person, place, and time.  Skin: Skin is warm and dry. He is not diaphoretic.  Psychiatric: He has a normal mood and affect.    Labs reviewed: Basic Metabolic Panel:  Recent Labs  01/00/71 0803 08/01/16 01/16/17  NA 136 137 140  K 4.3 4.3 4.7  CL 101  --   --   CO2 25  --   --   GLUCOSE 146*  --   --   BUN 17 15 12   CREATININE 0.87 0.9 1.0  CALCIUM 9.4  --   --    Liver Function Tests:  Recent Labs  05/02/16 0803 08/01/16 01/16/17  AST 37* 50* 61*  ALT 50* 70* 76*  ALKPHOS 73 93 102  BILITOT 0.8  --   --   PROT 6.8  --   --   ALBUMIN 4.1  --   --    No results for input(s): LIPASE, AMYLASE in the last 8760 hours. No results for input(s): AMMONIA in the last 8760 hours. CBC: No results for input(s): WBC, NEUTROABS, HGB, HCT, MCV, PLT in the last 8760 hours. Lipid Panel:  Recent Labs  08/01/16  CHOL 156  HDL 27*  LDLCALC 63  TRIG 219*   Lab Results  Component Value Date   HGBA1C 8.6 01/16/2017    Procedures: No results found.  Assessment/Plan 1. Preventative health care Pt doing well. No new acute concerns.The patient  was counseled regarding the appropriate use of alcohol, regular self-examination of the breasts on a monthly basis, prevention of dental and periodontal disease, diet, regular sustained exercise for at least 30 minutes 5 times per week, testicular self-examination on a monthly basis,smoking cessation, tobacco use,  and recommended schedule for GI hemoccult testing, colonoscopy, cholesterol, thyroid and diabetes screening.  2. Essential hypertension -blood pressure stable. Will cont on lisinopril. Cont dietary and exercise modifications.  - EKG 12-Lead-NSR rate 86  3. Diabetes mellitus type 2 in obese (HCC) -A1c worse, following with Edison Pace, pharm D routinely conts on metformin, tradjenta, jardiance  - metFORMIN (GLUCOPHAGE) 1000 MG tablet; Take 1 tablet (1,000 mg total) by mouth 2 (two) times daily with a meal.  Dispense: 180 tablet; Refill: 3 - Amb ref to Medical Nutrition Therapy-MNT to help with dietary modifications.   4. Hyperlipidemia LDL goal <100 conts on lipitor 10 mg daily, LDL at goal at 65, cont lifestyle modifications.   5. Elevated liver enzymes Mildly elevated, encourage to decrease/avoid use of tylenol and ETOh if possible as this could contribute to worsening of liver function. -will get [place] of liver at this time.   Next appt: 6 months Greidy Sherard K. Biagio Borg  [facility] [phone] 8 am - 5 pm) [phone] (after hours)

## 2017-01-20 NOTE — Patient Instructions (Signed)
6 month follow up with fasting labs prior to visit.  Keep appt as scheduled with Cathey  To work on diet and exercise.  Nutrition consult placed   Health Maintenance, Male A healthy lifestyle and preventive care is important for your health and wellness. Ask your health care provider about what schedule of regular examinations is right for you. What should I know about weight and diet? Eat a Healthy Diet  Eat plenty of vegetables, fruits, whole grains, low-fat dairy products, and lean protein.  Do not eat a lot of foods high in solid fats, added sugars, or salt.  Maintain a Healthy Weight Regular exercise can help you achieve or maintain a healthy weight. You should:  Do at least 150 minutes of exercise each week. The exercise should increase your heart rate and make you sweat (moderate-intensity exercise).  Do strength-training exercises at least twice a week.  Watch Your Levels of Cholesterol and Blood Lipids  Have your blood tested for lipids and cholesterol every 5 years starting at 56 years of age. If you are at high risk for heart disease, you should start having your blood tested when you are 56 years old. You may need to have your cholesterol levels checked more often if: ? Your lipid or cholesterol levels are high. ? You are older than 56 years of age. ? You are at high risk for heart disease.  What should I know about cancer screening? Many types of cancers can be detected early and may often be prevented. Lung Cancer  You should be screened every year for lung cancer if: ? You are a current smoker who has smoked for at least 30 years. ? You are a former smoker who has quit within the past 15 years.  Talk to your health care provider about your screening options, when you should start screening, and how often you should be screened.  Colorectal Cancer  Routine colorectal cancer screening usually begins at 56 years of age and should be repeated every 5-10 years  until you are 56 years old. You may need to be screened more often if early forms of precancerous polyps or small growths are found. Your health care provider may recommend screening at an earlier age if you have risk factors for colon cancer.  Your health care provider may recommend using home test kits to check for hidden blood in the stool.  A small camera at the end of a tube can be used to examine your colon (sigmoidoscopy or colonoscopy). This checks for the earliest forms of colorectal cancer.  Prostate and Testicular Cancer  Depending on your age and overall health, your health care provider may do certain tests to screen for prostate and testicular cancer.  Talk to your health care provider about any symptoms or concerns you have about testicular or prostate cancer.  Skin Cancer  Check your skin from head to toe regularly.  Tell your health care provider about any new moles or changes in moles, especially if: ? There is a change in a mole's size, shape, or color. ? You have a mole that is larger than a pencil eraser.  Always use sunscreen. Apply sunscreen liberally and repeat throughout the day.  Protect yourself by wearing long sleeves, pants, a wide-brimmed hat, and sunglasses when outside.  What should I know about heart disease, diabetes, and high blood pressure?  If you are 29-52 years of age, have your blood pressure checked every 3-5 years. If you are 40 years  of age or older, have your blood pressure checked every year. You should have your blood pressure measured twice-once when you are at a hospital or clinic, and once when you are not at a hospital or clinic. Record the average of the two measurements. To check your blood pressure when you are not at a hospital or clinic, you can use: ? An automated blood pressure machine at a pharmacy. ? A home blood pressure monitor.  Talk to your health care provider about your target blood pressure.  If you are between 24-21  years old, ask your health care provider if you should take aspirin to prevent heart disease.  Have regular diabetes screenings by checking your fasting blood sugar level. ? If you are at a normal weight and have a low risk for diabetes, have this test once every three years after the age of 85. ? If you are overweight and have a high risk for diabetes, consider being tested at a younger age or more often.  A one-time screening for abdominal aortic aneurysm (AAA) by ultrasound is recommended for men aged 65-75 years who are current or former smokers. What should I know about preventing infection? Hepatitis B If you have a higher risk for hepatitis B, you should be screened for this virus. Talk with your health care provider to find out if you are at risk for hepatitis B infection. Hepatitis C Blood testing is recommended for:  Everyone born from [birth year] through 1965.  Anyone with known risk factors for hepatitis C.  Sexually Transmitted Diseases (STDs)  You should be screened each year for STDs including gonorrhea and chlamydia if: ? You are sexually active and are younger than 56 years of age. ? You are older than 56 years of age and your health care provider tells you that you are at risk for this type of infection. ? Your sexual activity has changed since you were last screened and you are at an increased risk for chlamydia or gonorrhea. Ask your health care provider if you are at risk.  Talk with your health care provider about whether you are at high risk of being infected with HIV. Your health care provider may recommend a prescription medicine to help prevent HIV infection.  What else can I do?  Schedule regular health, dental, and eye exams.  Stay current with your vaccines (immunizations).  Do not use any tobacco products, such as cigarettes, chewing tobacco, and e-cigarettes. If you need help quitting, ask your health care provider.  Limit alcohol intake to no more than 2  drinks per day. One drink equals 12 ounces of beer, 5 ounces of wine, or 1 ounces of hard liquor.  Do not use street drugs.  Do not share needles.  Ask your health care provider for help if you need support or information about quitting drugs.  Tell your health care provider if you often feel depressed.  Tell your health care provider if you have ever been abused or do not feel safe at home. This information is not intended to replace advice given to you by your health care provider. Make sure you discuss any questions you have with your health care provider. Document Released: 11/16/2007 Document Revised: 01/17/2016 Document Reviewed: 02/21/2015 Elsevier Interactive Patient Education  2018 [employer].   DASH Eating Plan DASH stands for "Dietary Approaches to Stop Hypertension." The DASH eating plan is a healthy eating plan that has been shown to reduce high blood pressure (hypertension). It may also  reduce your risk for type 2 diabetes, heart disease, and stroke. The DASH eating plan may also help with weight loss. What are tips for following this plan? General guidelines  Avoid eating more than 2,300 mg (milligrams) of salt (sodium) a day. If you have hypertension, you may need to reduce your sodium intake to 1,500 mg a day.  Limit alcohol intake to no more than 1 drink a day for nonpregnant women and 2 drinks a day for men. One drink equals 12 oz of beer, 5 oz of wine, or 1 oz of hard liquor.  Work with your health care provider to maintain a healthy body weight or to lose weight. Ask what an ideal weight is for you.  Get at least 30 minutes of exercise that causes your heart to beat faster (aerobic exercise) most days of the week. Activities may include walking, swimming, or biking.  Work with your health care provider or diet and nutrition specialist (dietitian) to adjust your eating plan to your individual calorie needs. Reading food labels  Check food labels for the amount  of sodium per serving. Choose foods with less than 5 percent of the Daily Value of sodium. Generally, foods with less than 300 mg of sodium per serving fit into this eating plan.  To find whole grains, look for the word "whole" as the first word in the ingredient list. Shopping  Buy products labeled as "low-sodium" or "no salt added."  Buy fresh foods. Avoid canned foods and premade or frozen meals. Cooking  Avoid adding salt when cooking. Use salt-free seasonings or herbs instead of table salt or sea salt. Check with your health care provider or pharmacist before using salt substitutes.  Do not fry foods. Cook foods using healthy methods such as baking, boiling, grilling, and broiling instead.  Cook with heart-healthy oils, such as olive, canola, soybean, or sunflower oil. Meal planning   Eat a balanced diet that includes: ? 5 or more servings of fruits and vegetables each day. At each meal, try to fill half of your plate with fruits and vegetables. ? Up to 6-8 servings of whole grains each day. ? Less than 6 oz of lean meat, poultry, or fish each day. A 3-oz serving of meat is about the same size as a deck of cards. One egg equals 1 oz. ? 2 servings of low-fat dairy each day. ? A serving of nuts, seeds, or beans 5 times each week. ? Heart-healthy fats. Healthy fats called Omega-3 fatty acids are found in foods such as flaxseeds and coldwater fish, like sardines, salmon, and mackerel.  Limit how much you eat of the following: ? Canned or prepackaged foods. ? Food that is high in trans fat, such as fried foods. ? Food that is high in saturated fat, such as fatty meat. ? Sweets, desserts, sugary drinks, and other foods with added sugar. ? Full-fat dairy products.  Do not salt foods before eating.  Try to eat at least 2 vegetarian meals each week.  Eat more home-cooked food and less restaurant, buffet, and fast food.  When eating at a restaurant, ask that your food be prepared  with less salt or no salt, if possible. What foods are recommended? The items listed may not be a complete list. Talk with your dietitian about what dietary choices are best for you. Grains Whole-grain or whole-wheat bread. Whole-grain or whole-wheat pasta. Brown rice. Orpah Cobb. Bulgur. Whole-grain and low-sodium cereals. Pita bread. Low-fat, low-sodium crackers. Whole-wheat flour tortillas.  Vegetables Fresh or frozen vegetables (raw, steamed, roasted, or grilled). Low-sodium or reduced-sodium tomato and vegetable juice. Low-sodium or reduced-sodium tomato sauce and tomato paste. Low-sodium or reduced-sodium canned vegetables. Fruits All fresh, dried, or frozen fruit. Canned fruit in natural juice (without added sugar). Meat and other protein foods Skinless chicken or [place]. Ground chicken or [place]. Pork with fat trimmed off. Fish and seafood. Egg whites. Dried beans, peas, or lentils. Unsalted nuts, nut butters, and seeds. Unsalted canned beans. Lean cuts of beef with fat trimmed off. Low-sodium, lean deli meat. Dairy Low-fat (1%) or fat-free (skim) milk. Fat-free, low-fat, or reduced-fat cheeses. Nonfat, low-sodium ricotta or cottage cheese. Low-fat or nonfat yogurt. Low-fat, low-sodium cheese. Fats and oils Soft margarine without trans fats. Vegetable oil. Low-fat, reduced-fat, or light mayonnaise and salad dressings (reduced-sodium). Canola, safflower, olive, soybean, and sunflower oils. Avocado. Seasoning and other foods Herbs. Spices. Seasoning mixes without salt. Unsalted popcorn and pretzels. Fat-free sweets. What foods are not recommended? The items listed may not be a complete list. Talk with your dietitian about what dietary choices are best for you. Grains Baked goods made with fat, such as croissants, muffins, or some breads. Dry pasta or rice meal packs. Vegetables Creamed or fried vegetables. Vegetables in a cheese sauce. Regular canned vegetables (not low-sodium or  reduced-sodium). Regular canned tomato sauce and paste (not low-sodium or reduced-sodium). Regular tomato and vegetable juice (not low-sodium or reduced-sodium). Rosita Fire. Olives. Fruits Canned fruit in a light or heavy syrup. Fried fruit. Fruit in cream or butter sauce. Meat and other protein foods Fatty cuts of meat. Ribs. Fried meat. Tomasa Blase. Sausage. Bologna and other processed lunch meats. Salami. Fatback. Hotdogs. Bratwurst. Salted nuts and seeds. Canned beans with added salt. Canned or smoked fish. Whole eggs or egg yolks. Chicken or [place] with skin. Dairy Whole or 2% milk, cream, and half-and-half. Whole or full-fat cream cheese. Whole-fat or sweetened yogurt. Full-fat cheese. Nondairy creamers. Whipped toppings. Processed cheese and cheese spreads. Fats and oils Butter. Stick margarine. Lard. Shortening. Ghee. Bacon fat. Tropical oils, such as coconut, palm kernel, or palm oil. Seasoning and other foods Salted popcorn and pretzels. Onion salt, garlic salt, seasoned salt, table salt, and sea salt. Worcestershire sauce. Tartar sauce. Barbecue sauce. Teriyaki sauce. Soy sauce, including reduced-sodium. Steak sauce. Canned and packaged gravies. Fish sauce. Oyster sauce. Cocktail sauce. Horseradish that you find on the shelf. Ketchup. Mustard. Meat flavorings and tenderizers. Bouillon cubes. Hot sauce and Tabasco sauce. Premade or packaged marinades. Premade or packaged taco seasonings. Relishes. Regular salad dressings. Where to find more information:  National Heart, Lung, and Blood Institute: PopSteam.is  American Heart Association: www.heart.org Summary  The DASH eating plan is a healthy eating plan that has been shown to reduce high blood pressure (hypertension). It may also reduce your risk for type 2 diabetes, heart disease, and stroke.  With the DASH eating plan, you should limit salt (sodium) intake to 2,300 mg a day. If you have hypertension, you may need to reduce your sodium  intake to 1,500 mg a day.  When on the DASH eating plan, aim to eat more fresh fruits and vegetables, whole grains, lean proteins, low-fat dairy, and heart-healthy fats.  Work with your health care provider or diet and nutrition specialist (dietitian) to adjust your eating plan to your individual calorie needs. This information is not intended to replace advice given to you by your health care provider. Make sure you discuss any questions you have with your health care provider. Document  Released: 05/09/2011 Document Revised: 05/13/2016 Document Reviewed: 05/13/2016 Elsevier Interactive Patient Education  2017 [employer].

## 2017-01-21 ENCOUNTER — Telehealth: Payer: Self-pay

## 2017-01-21 NOTE — Telephone Encounter (Signed)
Left a message for patient to call the office to discuss the need for getting abdominal ultrasound done.

## 2017-01-21 NOTE — Telephone Encounter (Signed)
-----   Message from [name], NP sent at 01/21/2017  3:09 PM EDT ----- Will you notify pt I have placed an order for an abdominal ultrasound due to his persistent elevated liver enzymes I want to go ahead and get an [place] on this

## 2017-01-21 NOTE — Telephone Encounter (Signed)
I spoke with patient and he verbalized understanding.

## 2017-01-27 ENCOUNTER — Other Ambulatory Visit: Payer: Self-pay | Admitting: Nurse Practitioner

## 2017-01-27 ENCOUNTER — Ambulatory Visit
Admission: RE | Admit: 2017-01-27 | Discharge: 2017-01-27 | Disposition: A | Discharge status: Home / Self Care | Payer: 59 | Source: Ambulatory Visit | Attending: Nurse Practitioner | Admitting: Nurse Practitioner

## 2017-01-27 ENCOUNTER — Telehealth: Payer: Self-pay

## 2017-01-27 DIAGNOSIS — R748 Abnormal levels of other serum enzymes: Secondary | ICD-10-CM

## 2017-01-27 MED ORDER — SEMAGLUTIDE(0.25 OR 0.5MG/DOS) 2 MG/1.5ML ~~LOC~~ SOPN: 0.2500 mg | PEN_INJECTOR | SUBCUTANEOUS | 2 refills | Status: DC

## 2017-01-27 NOTE — Telephone Encounter (Signed)
[name], NP  Chriss Driver, CMA        Please let pt know that I have consulted with Cathey and we would like to stop tradjenta and start Ozempic (this is an injection so he will need to get pt counseling at the pharmacy) once weekly injection.  And lets move his appt with Lynden Ang to 4 weeks- we would potentially need to up the dose and therefore she can do it at that visit.     I spoke with patient and he verbalized understanding. He scheduled a follow up appointment with [facility] for 02/24/17. Tradjenta was removed from patient's med list. Rx has already been sent to pharmacy.

## 2017-01-27 NOTE — Telephone Encounter (Signed)
This encounter was created in error - please disregard.

## 2017-01-30 ENCOUNTER — Other Ambulatory Visit: Payer: Self-pay | Admitting: Nurse Practitioner

## 2017-01-30 DIAGNOSIS — N281 Cyst of kidney, acquired: Secondary | ICD-10-CM

## 2017-02-05 ENCOUNTER — Encounter: Payer: 59 | Attending: Nurse Practitioner | Admitting: Registered"

## 2017-02-05 ENCOUNTER — Encounter: Payer: Self-pay | Admitting: Registered"

## 2017-02-05 DIAGNOSIS — E119 Type 2 diabetes mellitus without complications: Secondary | ICD-10-CM | POA: Insufficient documentation

## 2017-02-05 DIAGNOSIS — Z713 Dietary counseling and surveillance: Secondary | ICD-10-CM | POA: Insufficient documentation

## 2017-02-05 DIAGNOSIS — E669 Obesity, unspecified: Secondary | ICD-10-CM | POA: Insufficient documentation

## 2017-02-05 NOTE — Progress Notes (Signed)
Diabetes Self-Management Education  Visit Type: First/Initial  Appt. Start Time: 0900 Appt. End Time: 1030  02/05/2017  Mr. Kyle Mcclure, identified by name and date of birth, is a 56 y.o. male with a diagnosis of Diabetes: Type 2.   ASSESSMENT Pt states in the last month he has made some changes such as cutting back on rice when eating out.  Per chart A1c was 7.4% 6 months ago, now is 8.6%. 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   Sleep: good 7-8 hrs. Stress: not much stress Emotional eating?: comfort food when bored  Next visit: recommend having B12 checked / take supplement, review labs     Diabetes Self-Management Education - 02/05/17 0907      Visit Information   Visit Type First/Initial     Initial Visit   Diabetes Type Type 2   Are you currently following a meal plan? No   Are you taking your medications as prescribed? Yes  GLP, metformin, SGLT2   Date Diagnosed a few years ago     Health Coping   How would you rate your overall health? Fair     Psychosocial Assessment   Patient Belief/Attitude about Diabetes Motivated to manage diabetes   How often do you need to have someone help you when you read instructions, pamphlets, or other written materials from your doctor or pharmacy? 1 - Never   What is the last grade level you completed in school? BS     Complications   Last HgB A1C per patient/outside source 8.6 %  per chart 01/16/17   How often do you check your blood sugar? 0 times/day (not testing)  lost meter, needs to go to pharmacy to get another   Have you had a dilated eye exam in the past 12 months? Yes   Have you had a dental exam in the past 12 months? Yes   Are you checking your feet? Yes   How many days per week are you checking your feet? 7     Dietary Intake   Breakfast protein shake, English muffin (to help with  upset stomach)   Snack (morning) walnuts, peanuts OR popcorn   Lunch sandwich OR Asian chicken   Snack (afternoon) nuts OR chips, cheetos OR PB crackers   Dinner shrimp, baked potato, green beans   Snack (evening) none   Beverage(s) water mostly, unsweet tea, coffee     Exercise   Exercise Type Light (walking / raking leaves)   How many days per week to you exercise? 4   How many minutes per day do you exercise? 30   Total minutes per week of exercise 120     Patient Education   Previous Diabetes Education No   Disease state  Definition of diabetes, type 1 and 2, and the diagnosis of diabetes;Factors that contribute to the development of diabetes   Nutrition management  Role of diet in the treatment of diabetes and the relationship between the three main macronutrients and blood glucose level;Food label reading, portion sizes and measuring food.;Carbohydrate counting   Physical activity and exercise  Role of exercise on diabetes management, blood pressure control and cardiac health.   Monitoring Identified appropriate SMBG and/or A1C goals.;Purpose and frequency of SMBG.     Individualized Goals (developed by patient)   Nutrition General guidelines for healthy choices and portions discussed  Physical Activity Exercise 3-5 times per week     Outcomes   Expected Outcomes Demonstrated interest in learning. Expect positive outcomes   Future DMSE 4-6 wks   Program Status Completed    Individualized Plan for Diabetes Self-Management Training:   Learning Objective:  Patient will have a greater understanding of diabetes self-management. Patient education plan is to attend individual and/or group sessions per assessed needs and concerns.  Patient Instructions   Continue taking medication as directed by MD. Talk to your doctor about metformin extended release to help with nausea.  Consider checking BG at alternate times per day, fasting and 2 hours after a meal, as directed by  MD  Omega 3 ideas: ground flax seed, and chia seeds in your protein shake.  Aim to eat balanced meals  Aim for 45-70 grams +/- 1 either way per meal  Aim for 0-30g Carbs per snack if hungry   Include protein with your meals and snacks  Consider reading food labels for Total Carbohydrate   Consider increasing your activity level daily as tolerated  Expected Outcomes:  Demonstrated interest in learning. Expect positive outcomes  Education material provided: Living Well with Diabetes, A1C conversion sheet and Carbohydrate counting sheet  If problems or questions, patient to contact team via:  Phone and MyChart  Future DSME appointment: 4-6 wks

## 2017-02-05 NOTE — Patient Instructions (Signed)
   Continue taking medication as directed by MD. Talk to your doctor about metformin extended release to help with nausea.  Consider checking BG at alternate times per day, fasting and 2 hours after a meal, as directed by MD  Omega 3 ideas: ground flax seed, and chia seeds in your protein shake.  Aim to eat balanced meals  Aim for 45-70 grams +/- 1 either way per meal  Aim for 0-30g Carbs per snack if hungry   Include protein with your meals and snacks  Consider reading food labels for Total Carbohydrate   Consider increasing your activity level daily as tolerated

## 2017-02-07 ENCOUNTER — Other Ambulatory Visit: Payer: Self-pay | Admitting: *Deleted

## 2017-02-07 MED ORDER — GLUCOSE BLOOD VI STRP
ORAL_STRIP | 12 refills | Status: DC
Start: 2017-02-07 — End: 2018-09-28

## 2017-02-07 NOTE — Telephone Encounter (Signed)
Patient stated that Shanda Bumpsjessica told him to get a One Touch Verio blood sugar machine. He got one and needs test strips sent to pharmacy. Sent.

## 2017-02-10 ENCOUNTER — Ambulatory Visit: Payer: 59 | Admitting: Pharmacotherapy

## 2017-02-23 ENCOUNTER — Other Ambulatory Visit: Payer: Self-pay | Admitting: Nurse Practitioner

## 2017-02-24 ENCOUNTER — Ambulatory Visit (INDEPENDENT_AMBULATORY_CARE_PROVIDER_SITE_OTHER): Payer: 59 | Admitting: Pharmacotherapy

## 2017-02-24 ENCOUNTER — Encounter: Payer: Self-pay | Admitting: Pharmacotherapy

## 2017-02-24 VITALS — BP 126/84 | HR 72 | Resp 16 | Wt 236.8 lb

## 2017-02-24 DIAGNOSIS — I10 Essential (primary) hypertension: Secondary | ICD-10-CM | POA: Diagnosis not present

## 2017-02-24 DIAGNOSIS — E669 Obesity, unspecified: Secondary | ICD-10-CM

## 2017-02-24 DIAGNOSIS — E1169 Type 2 diabetes mellitus with other specified complication: Secondary | ICD-10-CM

## 2017-02-24 MED ORDER — DULAGLUTIDE 1.5 MG/0.5ML ~~LOC~~ SOAJ: 1.5000 mg | SUBCUTANEOUS | 4 refills | Status: DC

## 2017-02-24 MED ORDER — SEMAGLUTIDE(0.25 OR 0.5MG/DOS) 2 MG/1.5ML ~~LOC~~ SOPN: 0.5000 mg | PEN_INJECTOR | SUBCUTANEOUS | 2 refills | Status: DC

## 2017-02-24 NOTE — Progress Notes (Signed)
  Subjective:    Kyle Mcclure is a 56 y.o.white male who presents for follow-up of Type 2 diabetes mellitus.   A1C has risen to 8.6% Tradjenta changed to [place]. Currently on Ozempic 0.15m once a week. Average BG: 1741mdl No hypoglycemia.  He is filling full faster Has been losing weight. He is walking for exercise. Denies problems with vision Denies problems with feet. Denies peripheral edema. Nocturia 3-4 times per week. No dysuria Staying well hydrated with water and unsweet tea.      Review of Systems A comprehensive review of systems was negative except for: Gastrointestinal: positive for nausea Genitourinary: positive for nocturia    Objective:    BP 126/84   Pulse 72   Resp 16   Wt 236 lb 12.8 oz (107.4 kg)   BMI 34.97 kg/m   General:  alert, cooperative and no distress  Oropharynx: normal findings: lips normal without lesions and gums healthy   Eyes:  negative findings: lids and lashes normal and conjunctivae and sclerae normal   Ears:  External ears normal        Lung: clear to auscultation bilaterally  Heart:  regular rate and rhythm     Extremities: extremities normal, atraumatic, no cyanosis or edema  Skin: warm and dry, no hyperpigmentation, vitiligo, or suspicious lesions     Neuro: mental status, speech normal, alert and oriented x3 and gait and station normal   Lab Review Glucose (mg/dL)  Date Value  01/16/2017 173 (H)  09/22/2015 135 (H)  05/18/2015 122 (H)   Glucose, Bld (mg/dL)  Date Value  05/02/2016 146 (H)  01/03/2016 130 (H)  10/29/2012 344 (H)   CO2 (mmol/L)  Date Value  01/16/2017 20  05/02/2016 25  01/03/2016 23   BUN  Date Value  01/16/2017 12 mg/dL  01/16/2017 12  08/01/2016 15 mg/dL   Creatinine  Date Value  01/16/2017 1.0  08/01/2016 0.9 mg/dL   Creat (mg/dL)  Date Value  05/02/2016 0.87  01/03/2016 0.99   Creatinine, Ser (mg/dL)  Date Value  01/16/2017 0.98  09/22/2015 1.05  05/18/2015 1.04        Assessment:    Diabetes Mellitus type II, under fair control.  Average BG has been improving since starting Ozempic. BP at goal <130/80.   Plan:    1.  Rx changes: change Ozemptic to Trulicity [id number] a week.  His insurance will not cover Ozempic.  2.  Counseled on risk / benefit of GLP-1 3.  Continue Metformin and Jardiance. 4.  Counseled on nutrition goals. 5.  Exercise goal is 30-45 minutes 5 x week. 6.  BP at goal <130/80.

## 2017-02-24 NOTE — Patient Instructions (Addendum)
Increase Ozempic 0.5mg  once a week.  When current supply ends, change to Trulicity 1.5mg  once a week.

## 2017-03-18 ENCOUNTER — Encounter: Payer: 59 | Attending: Nurse Practitioner | Admitting: Registered"

## 2017-03-18 DIAGNOSIS — E669 Obesity, unspecified: Secondary | ICD-10-CM | POA: Insufficient documentation

## 2017-03-18 DIAGNOSIS — E119 Type 2 diabetes mellitus without complications: Secondary | ICD-10-CM | POA: Diagnosis present

## 2017-03-18 DIAGNOSIS — Z713 Dietary counseling and surveillance: Secondary | ICD-10-CM | POA: Diagnosis not present

## 2017-03-18 DIAGNOSIS — E1169 Type 2 diabetes mellitus with other specified complication: Secondary | ICD-10-CM

## 2017-03-18 NOTE — Progress Notes (Signed)
  Diabetes Self-Management Education  Visit Type:  Follow-up  Appt. Start Time: 0800 Appt. End Time: 0830  03/18/2017  Mr. Kyle Mcclure, identified by name and date of birth, is a 56 y.o. male with a diagnosis of Diabetes:  .   ASSESSMENT Pt reports FBG 137-180, numbers are improving. Pt states he has changed in how much he eats and can go into a meal very hungry but lose desire to finish a meal. Pt reports he has lost 4lbs since last visit with a total weight loss of about 10 lb. Pt states he has more energy in afternoon now. Pt states with the cooler weather he has been walking more, 3x day taking breaks at work.  Patient Instructions  Consider asking for your B12 to be checked due to potential depletion from metformin and take a supplement if it is low. A nighttime snack may help bring down high blood sugar in the morning. If this strategy works you may have been having low blood sugar and rebound during the night. Consider checking your blood sugar 2 hrs after dinner in addition to your fasting blood sugar. Continue taking medication as directed by MD. Talk to your doctor about metformin extended release to help with nausea. And the potential effect of Trulicity - nausea.  Education material provided: Medication list, A1c chart  If problems or questions, patient to contact team via:  Phone and MyChart  Future DSME appointment:  PRN

## 2017-03-18 NOTE — Patient Instructions (Addendum)
Consider asking for your B12 to be checked due to potential depletion from metformin and take a supplement if it is low. A nighttime snack may help bring down high blood sugar in the morning. If this strategy works you may have been having low blood sugar and rebound during the night. Consider checking your blood sugar 2 hrs after dinner in addition to your fasting blood sugar. Continue taking medication as directed by MD. Talk to your doctor about metformin extended release to help with nausea. And the potential effect of Trulicity - nausea.

## 2017-04-22 LAB — BASIC METABOLIC PANEL
BUN: 14 (ref 4–21)
CREATININE: 1 (ref 0.6–1.3)
Glucose: 145
POTASSIUM: 4.6 (ref 3.4–5.3)
SODIUM: 140 (ref 137–147)

## 2017-04-22 LAB — HEPATIC FUNCTION PANEL
ALT: 50 — AB (ref 10–40)
AST: 38 (ref 14–40)
Alkaline Phosphatase: 91 (ref 25–125)
Bilirubin, Total: 0.6

## 2017-04-22 LAB — HEMOGLOBIN A1C: Hemoglobin A1C: 7.6

## 2017-04-23 ENCOUNTER — Other Ambulatory Visit: Payer: Self-pay | Admitting: *Deleted

## 2017-04-23 ENCOUNTER — Encounter: Payer: Self-pay | Admitting: *Deleted

## 2017-04-23 MED ORDER — ATORVASTATIN CALCIUM 10 MG PO TABS: ORAL_TABLET | ORAL | 1 refills | Status: DC

## 2017-04-23 NOTE — Telephone Encounter (Signed)
[facility]  

## 2017-04-28 ENCOUNTER — Ambulatory Visit: Payer: 59 | Admitting: Pharmacotherapy

## 2017-04-28 ENCOUNTER — Encounter: Payer: Self-pay | Admitting: Pharmacotherapy

## 2017-04-28 VITALS — BP 112/62 | HR 80 | Temp 97.6°F | Ht 68.5 in | Wt 232.0 lb

## 2017-04-28 DIAGNOSIS — I10 Essential (primary) hypertension: Secondary | ICD-10-CM

## 2017-04-28 DIAGNOSIS — E669 Obesity, unspecified: Secondary | ICD-10-CM | POA: Diagnosis not present

## 2017-04-28 DIAGNOSIS — E1169 Type 2 diabetes mellitus with other specified complication: Secondary | ICD-10-CM

## 2017-04-28 NOTE — Progress Notes (Signed)
  Subjective:    Kyle Mcclure is a 56 y.o.white male who presents for follow-up of Type 2 diabetes mellitus.   A1C is 7.6% - improved from 8.8% No problems with Trulicity.  Continues Jardiance and Metformin. Some nausea if he overeats. Walking for exercise.  Denies problems with vision Denies problems with feet No peripheral edema Nocturia 0-1 times per night. Needs to drink more water. No dysuria.  Average BG: 179m/dl   Review of Systems A comprehensive review of systems was negative except for: Gastrointestinal: positive for nausea Genitourinary: positive for nocturia    Objective:    BP 112/62   Pulse 80   Temp 97.6 F (36.4 C) (Oral)   Ht 5' 8.5" (1.74 m)   Wt 232 lb (105.2 kg)   SpO2 94%   BMI 34.76 kg/m   General:  alert, cooperative and no distress  Oropharynx: normal findings: lips normal without lesions and gums healthy   Eyes:  negative findings: lids and lashes normal and conjunctivae and sclerae normal   Ears:  external ears normal        Lung: clear to auscultation bilaterally  Heart:  regular rate and rhythm     Extremities: extremities normal, atraumatic, no cyanosis or edema  Skin: warm and dry, no hyperpigmentation, vitiligo, or suspicious lesions     Neuro: mental status, speech normal, alert and oriented x3 and gait and station normal   Lab Review Glucose (mg/dL)  Date Value  01/16/2017 173 (H)  09/22/2015 135 (H)  05/18/2015 122 (H)   Glucose, Bld (mg/dL)  Date Value  05/02/2016 146 (H)  01/03/2016 130 (H)  10/29/2012 344 (H)   CO2 (mmol/L)  Date Value  01/16/2017 20  05/02/2016 25  01/03/2016 23   BUN  Date Value  04/22/2017 14  01/16/2017 12 mg/dL  01/16/2017 12   Creatinine  Date Value  04/22/2017 1.0  01/16/2017 1.0  08/01/2016 0.9 mg/dL   Creat (mg/dL)  Date Value  05/02/2016 0.87  01/03/2016 0.99   Creatinine, Ser (mg/dL)  Date Value  01/16/2017 0.98  09/22/2015 1.05  05/18/2015 1.04        Assessment:    Diabetes Mellitus type II, under good control. A1C much closer to goal <7% BP at goal <130/80   Plan:    1.  Rx changes: none  2.  Continue Trulicity [id number] 3.  Continue Jardiance 275mdaily 4.  Continue metformin 100027mID 5.  Counseled on nutrition goals and meal planning. 6.  Encouraged routine exercise.  Goal is 30-45 minutes 5 x week. 7.  BP at goal <130/80

## 2017-04-28 NOTE — Patient Instructions (Signed)
Keep up the good work

## 2017-06-21 ENCOUNTER — Other Ambulatory Visit: Payer: Self-pay | Admitting: Nurse Practitioner

## 2017-06-24 ENCOUNTER — Other Ambulatory Visit: Payer: Self-pay | Admitting: Internal Medicine

## 2017-07-17 ENCOUNTER — Other Ambulatory Visit: Payer: Self-pay

## 2017-07-18 ENCOUNTER — Other Ambulatory Visit (INDEPENDENT_AMBULATORY_CARE_PROVIDER_SITE_OTHER): Payer: Managed Care, Other (non HMO)

## 2017-07-18 DIAGNOSIS — E669 Obesity, unspecified: Principal | ICD-10-CM

## 2017-07-18 DIAGNOSIS — E1169 Type 2 diabetes mellitus with other specified complication: Secondary | ICD-10-CM

## 2017-07-19 ENCOUNTER — Other Ambulatory Visit: Payer: Self-pay | Admitting: Nurse Practitioner

## 2017-07-19 LAB — LIPID PANEL
Cholesterol: 132 mg/dL (ref <200)
HDL: 28 mg/dL — ABNORMAL LOW (ref >40)
LDL Cholesterol (Calc): 67 mg/dL (calc)
Non-HDL Cholesterol (Calc): 104 mg/dL (calc) (ref <130)
Total CHOL/HDL Ratio: 4.7 (calc) (ref <5.0)
Triglycerides: 285 mg/dL — ABNORMAL HIGH (ref <150)

## 2017-07-19 LAB — HEMOGLOBIN A1C
Hgb A1c MFr Bld: 7.5 % of total Hgb — ABNORMAL HIGH (ref <5.7)
Mean Plasma Glucose: 169 (calc)
eAG (mmol/L): 9.3 (calc)

## 2017-07-19 LAB — COMPLETE METABOLIC PANEL WITH GFR
AG Ratio: 1.6 (calc) (ref 1.0–2.5)
ALT: 74 U/L — ABNORMAL HIGH (ref 9–46)
AST: 57 U/L — ABNORMAL HIGH (ref 10–35)
Albumin: 4.4 g/dL (ref 3.6–5.1)
Alkaline phosphatase (APISO): 89 U/L (ref 40–115)
BUN: 14 mg/dL (ref 7–25)
CO2: 26 mmol/L (ref 20–32)
Calcium: 9.7 mg/dL (ref 8.6–10.3)
Chloride: 97 mmol/L — ABNORMAL LOW (ref 98–110)
Creat: 0.9 mg/dL (ref 0.70–1.33)
GFR, Est African American: 110 mL/min/{1.73_m2} (ref >=60)
GFR, Est Non African American: 95 mL/min/{1.73_m2} (ref >=60)
Globulin: 2.7 g/dL (calc) (ref 1.9–3.7)
Glucose, Bld: 169 mg/dL — ABNORMAL HIGH (ref 65–99)
Potassium: 4.3 mmol/L (ref 3.5–5.3)
Sodium: 136 mmol/L (ref 135–146)
Total Bilirubin: 1 mg/dL (ref 0.2–1.2)
Total Protein: 7.1 g/dL (ref 6.1–8.1)

## 2017-07-19 LAB — MICROALBUMIN / CREATININE URINE RATIO
Creatinine, Urine: 105 mg/dL (ref 20–320)
Microalb Creat Ratio: 20 mcg/mg creat (ref <30)
Microalb, Ur: 2.1 mg/dL

## 2017-07-21 ENCOUNTER — Ambulatory Visit: Payer: Managed Care, Other (non HMO) | Admitting: Pharmacotherapy

## 2017-07-21 ENCOUNTER — Encounter: Payer: Self-pay | Admitting: Pharmacotherapy

## 2017-07-21 VITALS — BP 120/78 | HR 70 | Temp 98.6°F | Ht 69.0 in | Wt 232.0 lb

## 2017-07-21 DIAGNOSIS — I10 Essential (primary) hypertension: Secondary | ICD-10-CM

## 2017-07-21 DIAGNOSIS — E1169 Type 2 diabetes mellitus with other specified complication: Secondary | ICD-10-CM

## 2017-07-21 DIAGNOSIS — E669 Obesity, unspecified: Secondary | ICD-10-CM

## 2017-07-21 MED ORDER — PIOGLITAZONE HCL 15 MG PO TABS: 15.0000 mg | ORAL_TABLET | Freq: Every day | ORAL | 4 refills | Status: DC

## 2017-07-21 MED ORDER — DULAGLUTIDE 1.5 MG/0.5ML ~~LOC~~ SOAJ: 1.5000 mg | SUBCUTANEOUS | 4 refills | Status: DC

## 2017-07-21 NOTE — Progress Notes (Signed)
  Subjective:    Kyle Mcclure is a 57 y.o.white male who presents for follow-up of Type 2 diabetes mellitus.   A1C: 7.5% (was 7.6%) EGFR: 95 ml/min LDL: 65 TG:  285 LFT's are higher  Denies missed medication Remains on Trulicity, Jardiance, Metformin  He admits that he gets nauseated on Trulicity day.  Diet still has greasy food. No routine exercise.  Walks with work. Denies problems with vision Denies problems with feet No peripheral edema. Nocturia once per night No dysuria Staying well hydrated.   Review of Systems A comprehensive review of systems was negative except for: Constitutional: positive for fatigue and malaise Genitourinary: positive for nocturia    Objective:    BP 120/78   Pulse 70   Temp 98.6 F (37 C) (Oral)   Ht _0  (1.753 m)   Wt 232 lb (105.2 kg)   SpO2 96%   BMI 34.26 kg/m   General:  alert, cooperative and no distress  Oropharynx: normal findings: lips normal without lesions and gums healthy   Eyes:  negative findings: lids and lashes normal and conjunctivae and sclerae normal   Ears:  external ears normal        Lung: clear to auscultation bilaterally  Heart:  regular rate and rhythm     Extremities: extremities normal, atraumatic, no cyanosis or edema  Skin: warm and dry, no hyperpigmentation, vitiligo, or suspicious lesions     Neuro: mental status, speech normal, alert and oriented x3 and gait and station normal   Lab Review Glucose (mg/dL)  Date Value  01/16/2017 173 (H)   Glucose, Bld (mg/dL)  Date Value  07/18/2017 169 (H)  05/02/2016 146 (H)  01/03/2016 130 (H)   CO2 (mmol/L)  Date Value  07/18/2017 26  01/16/2017 20  05/02/2016 25   BUN  Date Value  07/18/2017 14 mg/dL  04/22/2017 14  01/16/2017 12 mg/dL  01/16/2017 12   Creatinine  Date Value  04/22/2017 1.0  01/16/2017 1.0  08/01/2016 0.9 mg/dL   Creat (mg/dL)  Date Value  07/18/2017 0.90  05/02/2016 0.87  01/03/2016 0.99   Creatinine, Ser  (mg/dL)  Date Value  01/16/2017 0.98       Assessment:    Diabetes Mellitus type II, under fair control. A1C above goal <7% at 7.5% BP at goal <130/80 LDL at goal <70   Plan:    1.  Rx changes: Add pioglitazone 75m daily.  Pioglitazone will help reduce insulin resistance and data supports its use in fatty liver disease.  2.  Continue Trulicity 11.6XWweekly, Jardiance 231mdaily, and Metfomrin 100060mID. 3.  Counseled on nutrition goals.  Needs to reduce high saturated fat and portions of starchy foods. 4.  Counseled on benefit of routine exercise.  Goal is 30-45 minutes 5 x week. 5.  Discussed how uncontrolled DM can exacerbate erectile dysfunction.  He is working with urologist. 6.  BP at goal <130/80 7.  LDL at goal, but TG too high.  Discussed need for dietary and exercise improvements.  Continue atorvastatin.

## 2017-07-21 NOTE — Patient Instructions (Signed)
Start Pioglitazone 15mg  daily

## 2017-07-25 ENCOUNTER — Ambulatory Visit: Payer: Managed Care, Other (non HMO) | Admitting: Nurse Practitioner

## 2017-07-25 ENCOUNTER — Encounter: Payer: Self-pay | Admitting: Nurse Practitioner

## 2017-07-25 VITALS — BP 124/80 | HR 91 | Temp 98.3°F | Ht 69.0 in | Wt 233.0 lb

## 2017-07-25 DIAGNOSIS — E785 Hyperlipidemia, unspecified: Secondary | ICD-10-CM | POA: Diagnosis not present

## 2017-07-25 DIAGNOSIS — I10 Essential (primary) hypertension: Secondary | ICD-10-CM

## 2017-07-25 DIAGNOSIS — N400 Enlarged prostate without lower urinary tract symptoms: Secondary | ICD-10-CM | POA: Diagnosis not present

## 2017-07-25 DIAGNOSIS — E1169 Type 2 diabetes mellitus with other specified complication: Secondary | ICD-10-CM

## 2017-07-25 DIAGNOSIS — R748 Abnormal levels of other serum enzymes: Secondary | ICD-10-CM | POA: Diagnosis not present

## 2017-07-25 DIAGNOSIS — E669 Obesity, unspecified: Secondary | ICD-10-CM | POA: Diagnosis not present

## 2017-07-25 MED ORDER — PIOGLITAZONE HCL 15 MG PO TABS: 15.0000 mg | ORAL_TABLET | Freq: Every day | ORAL | 1 refills | Status: DC

## 2017-07-25 NOTE — Progress Notes (Signed)
Careteam: Patient Care Team: Sharon SellerEubanks, Jhalil Silvera K, NP as PCP - General (Nurse Practitioner) Estrella DeedsYoakum, John, OD as Consulting Physician (Optometry) Ihor Gullyttelin, Mark, MD as Consulting Physician (Urology)  Advanced Directive information    Allergies  Allergen Reactions  . Hydrocodone Nausea And Vomiting  . Oxycodone Nausea And Vomiting    Chief Complaint  Patient presents with  . Medical Management of Chronic Issues    Pt is being seen for a 6 month routine visit. Pt has no concerns today.   . Medication Refill    Rx pended for actos; Ronal FearCathy Miller prescribed on 07/21/17 but rx not sent to pharmacy.      HPI: Patient is a 57 y.o. male seen in the office today for routine follow up. Pt with no concerns today DM-Has been following with Edison Paceathey Miller for Diabetes, last A1c 7.5 and actos was added but not sent to pharmacy, pt also taking trulicity, jardiance and metformin  Hyperlipidemia- LDL at goal at 67 however triglycerides remain elevated at 285, currently on lipitor 10 mg daily Has not been exercising or made dietary modifications.  Was sick for a while so that did not help   Continues to use potassium to prevent kidney stones- urologist prescribes this.   HTN- controlled on lisinopril  Allergies- currnetly on zyrtec and flonase.   Elevated liver enzyme- has cut back on ETOH, drinking beer occasionally- weekends mostly   Review of Systems:  Review of Systems  Constitutional: Negative for chills, fever and weight loss.  HENT: Negative for tinnitus.   Respiratory: Negative for cough, sputum production and shortness of breath.   Cardiovascular: Negative for chest pain, palpitations and leg swelling.  Gastrointestinal: Negative for abdominal pain, constipation, diarrhea and heartburn.  Genitourinary: Negative for dysuria, frequency and urgency.  Musculoskeletal: Negative for back pain, joint pain and myalgias.  Skin: Negative.   Neurological: Negative for dizziness and  headaches.  Psychiatric/Behavioral: Negative for depression and memory loss. The patient does not have insomnia.     Past Medical History:  Diagnosis Date  . Diabetes mellitus without complication (HCC)    type 2  . History of kidney stones   . Hyperlipidemia   . Hypertension   . Seasonal allergies    Past Surgical History:  Procedure Laterality Date  . LITHOTRIPSY  12/2012  . TONSILLECTOMY    . wisdomteeth extraction     Social History:   reports that he quit smoking about 27 years ago. His smoking use included cigarettes. He has a 1.50 pack-year smoking history. he has never used smokeless tobacco. He reports that he drinks about 2.4 oz of alcohol per week. He reports that he does not use drugs.  Family History  Problem Relation Age of Onset  . Colon cancer Neg Hx   . Rectal cancer Neg Hx   . Stomach cancer Neg Hx   . Esophageal cancer Neg Hx     Medications: Patient's Medications  New Prescriptions   No medications on file  Previous Medications   ASPIRIN 81 MG TABLET    Take 81 mg by mouth daily.   ATORVASTATIN (LIPITOR) 10 MG TABLET    Take one tablet by mouth once daily   CETIRIZINE (ZYRTEC) 10 MG TABLET    Take 10 mg by mouth daily.   DULAGLUTIDE (TRULICITY) 1.5 MG/0.5ML SOPN    Inject 1.5 mg into the skin once a week.   FLUTICASONE (FLONASE) 50 MCG/ACT NASAL SPRAY    Place 1 spray into both  nostrils as needed for allergies or rhinitis.   GLUCOSE BLOOD (ONETOUCH VERIO) TEST STRIP    Use to test blood sugar twice daily Dx: E11.69   JARDIANCE 25 MG TABS TABLET    TAKE 1 TABLET BY MOUTH EVERY DAY   LANCET DEVICES (ONE TOUCH DELICA LANCING DEV) MISC    by Does not apply route. Check blood sugar 2 x daily as directed DX: 250.02   LISINOPRIL (PRINIVIL,ZESTRIL) 10 MG TABLET    TAKE 1 TABLET BY MOUTH EVERY DAY   METFORMIN (GLUCOPHAGE) 1000 MG TABLET    Take 1 tablet (1,000 mg total) by mouth 2 (two) times daily with a meal.   PIOGLITAZONE (ACTOS) 15 MG TABLET    Take 1  tablet (15 mg total) by mouth daily.   POTASSIUM CITRATE (UROCIT-K) 10 MEQ (1080 MG) SR TABLET    2 by mouth once daily to prevent kidney stones  Modified Medications   No medications on file  Discontinued Medications   No medications on file     Physical Exam:  Vitals:   07/25/17 0821  BP: 124/80  Pulse: 91  Temp: 98.3 F (36.8 C)  TempSrc: Oral  SpO2: 97%  Weight: 233 lb (105.7 kg)  Height: 5\' 9"  (1.753 m)   Body mass index is 34.41 kg/m.  Physical Exam  Constitutional: He is oriented to person, place, and time. He appears well-developed and well-nourished. No distress.  HENT:  Head: Normocephalic and atraumatic.  Mouth/Throat: Oropharynx is clear and moist. No oropharyngeal exudate.  Eyes: Conjunctivae and EOM are normal. Pupils are equal, round, and reactive to light.  Neck: Normal range of motion. Neck supple. No thyromegaly present.  Cardiovascular: Normal rate, regular rhythm and normal heart sounds.  Pulmonary/Chest: Effort normal and breath sounds normal.  Abdominal: Soft. Bowel sounds are normal. He exhibits no distension. There is no tenderness.  Obese abdomen   Musculoskeletal: Normal range of motion. He exhibits no edema or tenderness.  Lymphadenopathy:    He has no cervical adenopathy.  Neurological: He is alert and oriented to person, place, and time.  Skin: Skin is warm and dry. He is not diaphoretic.  Psychiatric: He has a normal mood and affect.    Labs reviewed: Basic Metabolic Panel: Recent Labs    01/16/17 0909 04/22/17 07/18/17 0806  NA 140 140 136  K 4.7 4.6 4.3  CL 101  --  97*  CO2 20  --  26  GLUCOSE 173*  --  169*  BUN 12 14 14   CREATININE 0.98 1.0 0.90  CALCIUM 9.7  --  9.7   Liver Function Tests: Recent Labs    01/16/17 01/16/17 0909 04/22/17 07/18/17 0806  AST 61* 61* 38 57*  ALT 76* 76* 50* 74*  ALKPHOS 102 102 91  --   BILITOT  --  0.6  --  1.0  PROT  --  7.6  --  7.1  ALBUMIN  --  4.6  --   --    No results for  input(s): LIPASE, AMYLASE in the last 8760 hours. No results for input(s): AMMONIA in the last 8760 hours. CBC: No results for input(s): WBC, NEUTROABS, HGB, HCT, MCV, PLT in the last 8760 hours. Lipid Panel: Recent Labs    08/01/16 07/18/17 0806  CHOL 156 132  HDL 27* 28*  LDLCALC 63  --   TRIG 331* 285*  CHOLHDL  --  4.7   TSH: No results for input(s): TSH in the last 8760 hours.  A1C: Lab Results  Component Value Date   HGBA1C 7.5 (H) 07/18/2017     Assessment/Plan 1. Diabetes mellitus type 2 in obese [facility]) -following with Edison Pace, pharm D with actos added. Continues current regimen and encouraged lifestyle modifications. Pt reports he is going to try to increase activity once the weather is nicer. States he is aware he needs to work on diet as well. Information provided on this. - pioglitazone (ACTOS) 15 MG tablet; Take 1 tablet (15 mg total) by mouth daily.  Dispense: 90 tablet; Refill: 1  2. Essential hypertension -controlled on lisinopril  - CBC with Differential/Platelets; Future  3. Hyperlipidemia LDL goal <70 - continues on statin. LDL at goal however triglycerides elevated, given information on low triglyceride diet.   4. Elevated liver enzymes -stable, following, he has cut back on ETOH use. Pending labs scheduled  5. Benign prostatic hyperplasia without lower urinary tract symptoms -stable, ongoing follow up with urology  Next appt: 10/17/2017   Janene Harvey. Biagio Borg  [facility] [phone] 8 am - 5 pm) [phone] (after hours)

## 2017-07-25 NOTE — Patient Instructions (Addendum)
Increase activity- 30 mins/5 days a week and work on [employer] diet.    Food Choices to Lower Your Triglycerides Triglycerides are a type of fat in your blood. High levels of triglycerides can increase the risk of heart disease and stroke. If your triglyceride levels are high, the foods you eat and your eating habits are very important. Choosing the right foods can help lower your triglycerides. What general guidelines do I need to follow?  Lose weight if you are overweight.  Limit or avoid alcohol.  Fill one half of your plate with vegetables and green salads.  Limit fruit to two servings a day. Choose fruit instead of juice.  Make one fourth of your plate whole grains. Look for the word "whole" as the first word in the ingredient list.  Fill one fourth of your plate with lean protein foods.  Enjoy fatty fish (such as salmon, mackerel, sardines, and tuna) three times a week.  Choose healthy fats.  Limit foods high in starch and sugar.  Eat more home-cooked food and less restaurant, buffet, and fast food.  Limit fried foods.  Cook foods using methods other than frying.  Limit saturated fats.  Check ingredient lists to avoid foods with partially hydrogenated oils (trans fats) in them. What foods can I eat? Grains Whole grains, such as whole wheat or whole grain breads, crackers, cereals, and pasta. Unsweetened oatmeal, bulgur, barley, quinoa, or brown rice. Corn or whole wheat flour tortillas. Vegetables Fresh or frozen vegetables (raw, steamed, roasted, or grilled). Green salads. Fruits All fresh, canned (in natural juice), or frozen fruits. Meat and Other Protein Products Ground beef (85% or leaner), grass-fed beef, or beef trimmed of fat. Skinless chicken or [place]. Ground chicken or [place]. Pork trimmed of fat. All fish and seafood. Eggs. Dried beans, peas, or lentils. Unsalted nuts or seeds. Unsalted canned or dry beans. Dairy Low-fat dairy products, such as skim or 1%  milk, 2% or reduced-fat cheeses, low-fat ricotta or cottage cheese, or plain low-fat yogurt. Fats and Oils Tub margarines without trans fats. Light or reduced-fat mayonnaise and salad dressings. Avocado. Safflower, olive, or canola oils. Natural peanut or almond butter. The items listed above may not be a complete list of recommended foods or beverages. Contact your dietitian for more options. What foods are not recommended? Grains White bread. White pasta. White rice. Cornbread. Bagels, pastries, and croissants. Crackers that contain trans fat. Vegetables White potatoes. Corn. Creamed or fried vegetables. Vegetables in a cheese sauce. Fruits Dried fruits. Canned fruit in light or heavy syrup. Fruit juice. Meat and Other Protein Products Fatty cuts of meat. Ribs, chicken wings, bacon, sausage, bologna, salami, chitterlings, fatback, hot dogs, bratwurst, and packaged luncheon meats. Dairy Whole or 2% milk, cream, half-and-half, and cream cheese. Whole-fat or sweetened yogurt. Full-fat cheeses. Nondairy creamers and whipped toppings. Processed cheese, cheese spreads, or cheese curds. Sweets and Desserts Corn syrup, sugars, honey, and molasses. Candy. Jam and jelly. Syrup. Sweetened cereals. Cookies, pies, cakes, donuts, muffins, and ice cream. Fats and Oils Butter, stick margarine, lard, shortening, ghee, or bacon fat. Coconut, palm kernel, or palm oils. Beverages Alcohol. Sweetened drinks (such as sodas, lemonade, and fruit drinks or punches). The items listed above may not be a complete list of foods and beverages to avoid. Contact your dietitian for more information. This information is not intended to replace advice given to you by your health care provider. Make sure you discuss any questions you have with your health care provider. Document Released:  03/07/2004 Document Revised: 10/26/2015 Document Reviewed: 03/24/2013 Elsevier Interactive Patient Education  2017 [employer].

## 2017-07-28 ENCOUNTER — Other Ambulatory Visit: Payer: Self-pay | Admitting: *Deleted

## 2017-07-28 MED ORDER — EMPAGLIFLOZIN 25 MG PO TABS: ORAL_TABLET | ORAL | 1 refills | Status: DC

## 2017-07-28 NOTE — Telephone Encounter (Signed)
[facility]  

## 2017-09-17 ENCOUNTER — Telehealth: Payer: Self-pay | Admitting: Internal Medicine

## 2017-09-24 NOTE — Telephone Encounter (Signed)
Call Completed

## 2017-09-26 LAB — HM DIABETES EYE EXAM

## 2017-10-06 ENCOUNTER — Encounter: Payer: Self-pay | Admitting: *Deleted

## 2017-10-15 ENCOUNTER — Other Ambulatory Visit: Payer: Self-pay

## 2017-10-15 DIAGNOSIS — E1169 Type 2 diabetes mellitus with other specified complication: Secondary | ICD-10-CM

## 2017-10-15 DIAGNOSIS — E669 Obesity, unspecified: Principal | ICD-10-CM

## 2017-10-16 ENCOUNTER — Other Ambulatory Visit: Payer: Self-pay | Admitting: Nurse Practitioner

## 2017-10-17 ENCOUNTER — Other Ambulatory Visit: Payer: Managed Care, Other (non HMO)

## 2017-10-20 ENCOUNTER — Ambulatory Visit: Payer: Managed Care, Other (non HMO) | Admitting: Nurse Practitioner

## 2017-10-24 ENCOUNTER — Other Ambulatory Visit: Payer: Managed Care, Other (non HMO)

## 2017-10-24 DIAGNOSIS — I10 Essential (primary) hypertension: Secondary | ICD-10-CM

## 2017-10-24 DIAGNOSIS — E1169 Type 2 diabetes mellitus with other specified complication: Secondary | ICD-10-CM

## 2017-10-24 DIAGNOSIS — E669 Obesity, unspecified: Principal | ICD-10-CM

## 2017-10-24 LAB — CBC WITH DIFFERENTIAL/PLATELET
Basophils Absolute: 91 cells/uL (ref 0–200)
Basophils Relative: 1.1 %
EOS PCT: 2.6 %
Eosinophils Absolute: 216 cells/uL (ref 15–500)
HEMATOCRIT: 43.8 % (ref 38.5–50.0)
HEMOGLOBIN: 15.4 g/dL (ref 13.2–17.1)
LYMPHS ABS: 2216 {cells}/uL (ref 850–3900)
MCH: 30.7 pg (ref 27.0–33.0)
MCHC: 35.2 g/dL (ref 32.0–36.0)
MCV: 87.3 fL (ref 80.0–100.0)
MONOS PCT: 6.7 %
MPV: 9.3 fL (ref 7.5–12.5)
NEUTROS ABS: 5221 {cells}/uL (ref 1500–7800)
Neutrophils Relative %: 62.9 %
Platelets: 235 10*3/uL (ref 140–400)
RBC: 5.02 10*6/uL (ref 4.20–5.80)
RDW: 12.4 % (ref 11.0–15.0)
Total Lymphocyte: 26.7 %
WBC mixed population: 556 cells/uL (ref 200–950)
WBC: 8.3 10*3/uL (ref 3.8–10.8)

## 2017-10-24 LAB — COMPLETE METABOLIC PANEL WITH GFR
AG RATIO: 2 (calc) (ref 1.0–2.5)
ALKALINE PHOSPHATASE (APISO): 71 U/L (ref 40–115)
ALT: 47 U/L — AB (ref 9–46)
AST: 37 U/L — ABNORMAL HIGH (ref 10–35)
Albumin: 4.7 g/dL (ref 3.6–5.1)
BILIRUBIN TOTAL: 0.7 mg/dL (ref 0.2–1.2)
BUN: 18 mg/dL (ref 7–25)
CHLORIDE: 103 mmol/L (ref 98–110)
CO2: 25 mmol/L (ref 20–32)
Calcium: 9.6 mg/dL (ref 8.6–10.3)
Creat: 0.87 mg/dL (ref 0.70–1.33)
GFR, Est African American: 112 mL/min/{1.73_m2} (ref >=60)
GFR, Est Non African American: 96 mL/min/{1.73_m2} (ref >=60)
GLUCOSE: 134 mg/dL — AB (ref 65–99)
Globulin: 2.3 g/dL (calc) (ref 1.9–3.7)
POTASSIUM: 4.5 mmol/L (ref 3.5–5.3)
Sodium: 138 mmol/L (ref 135–146)
Total Protein: 7 g/dL (ref 6.1–8.1)

## 2017-10-24 LAB — HEMOGLOBIN A1C
EAG (MMOL/L): 8.4 (calc)
HEMOGLOBIN A1C: 6.9 %{Hb} — AB (ref <5.7)
Mean Plasma Glucose: 151 (calc)

## 2017-10-28 ENCOUNTER — Ambulatory Visit: Payer: Managed Care, Other (non HMO) | Admitting: Nurse Practitioner

## 2017-10-28 ENCOUNTER — Encounter: Payer: Self-pay | Admitting: Nurse Practitioner

## 2017-10-28 VITALS — BP 118/78 | HR 78 | Temp 98.2°F | Ht 69.0 in | Wt 240.0 lb

## 2017-10-28 DIAGNOSIS — E1169 Type 2 diabetes mellitus with other specified complication: Secondary | ICD-10-CM

## 2017-10-28 DIAGNOSIS — R748 Abnormal levels of other serum enzymes: Secondary | ICD-10-CM

## 2017-10-28 DIAGNOSIS — E669 Obesity, unspecified: Secondary | ICD-10-CM | POA: Diagnosis not present

## 2017-10-28 DIAGNOSIS — I10 Essential (primary) hypertension: Secondary | ICD-10-CM

## 2017-10-28 DIAGNOSIS — E785 Hyperlipidemia, unspecified: Secondary | ICD-10-CM

## 2017-10-28 DIAGNOSIS — J302 Other seasonal allergic rhinitis: Secondary | ICD-10-CM | POA: Diagnosis not present

## 2017-10-28 NOTE — Progress Notes (Signed)
Careteam: Patient Care Team: [name], NP as PCP - General (Nurse Practitioner) [name], OD as Consulting Physician (Optometry) [name], MD as Consulting Physician (Urology)  Advanced Directive information Does Patient Have a Medical Advance Directive?: Yes, Type of Advance Directive: Healthcare Power of [place];Living will, Does patient want to make changes to medical advance directive?: No - Patient declined  Allergies  Allergen Reactions  . Hydrocodone Nausea And Vomiting  . Oxycodone Nausea And Vomiting    Chief Complaint  Patient presents with  . Medical Management of Chronic Issues    Pt is being seen for a 3 month routine visit. Pt reports no concerns today.   . ACP    Has HCPOA and living will- copies requested     HPI: Patient is a 57 y.o. male seen in the office today for routine follow up.   DM- previously seeing Edison Pace routinely for diabetes management. A1c improved to 6.9 from 7.5 due to diet.  Would like to increase exercise but time is an issue.  Does not check blood glucose at home. Eating less carbs and more protein. Drinking more water.  Recently went to ophthalmology- no retinopathy.  Taking trulicity, jardiance, metformin, and actos for diabetes.   Elevated liver enzymes- minimal alcohol. Will have bourbon on the weekends occasionally.  Does not use tylenol.   Seasonal allergies- controlled on zyrtec and flonase, uses flonase occasionally.    Review of Systems:  Review of Systems  Constitutional: Negative for chills, fever and weight loss.  HENT: Negative for tinnitus.   Respiratory: Negative for cough, sputum production and shortness of breath.   Cardiovascular: Negative for chest pain, palpitations and leg swelling.  Gastrointestinal: Negative for abdominal pain, constipation, diarrhea and heartburn.  Genitourinary: Negative for dysuria, frequency and urgency.  Musculoskeletal: Negative for back pain, joint pain  and myalgias.  Skin: Negative.   Neurological: Negative for dizziness and headaches.  Endo/Heme/Allergies: Positive for environmental allergies (controlled).    Past Medical History:  Diagnosis Date  . Diabetes mellitus without complication (HCC)    type 2  . History of kidney stones   . Hyperlipidemia   . Hypertension   . Seasonal allergies    Past Surgical History:  Procedure Laterality Date  . LITHOTRIPSY  12/2012  . TONSILLECTOMY    . wisdomteeth extraction     Social History:   reports that he quit smoking about 27 years ago. His smoking use included cigarettes. He has a 1.50 pack-year smoking history. He has never used smokeless tobacco. He reports that he drinks about 2.4 oz of alcohol per week. He reports that he does not use drugs.  Family History  Problem Relation Age of Onset  . Colon cancer Neg Hx   . Rectal cancer Neg Hx   . Stomach cancer Neg Hx   . Esophageal cancer Neg Hx     Medications: Patient's Medications  New Prescriptions   No medications on file  Previous Medications   ASPIRIN 81 MG TABLET    Take 81 mg by mouth daily.   ATORVASTATIN (LIPITOR) 10 MG TABLET    TAKE 1 TABLET BY MOUTH EVERY DAY   CETIRIZINE (ZYRTEC) 10 MG TABLET    Take 10 mg by mouth daily.   DULAGLUTIDE (TRULICITY) 1.5 MG/0.5ML SOPN    Inject 1.5 mg into the skin once a week.   EMPAGLIFLOZIN (JARDIANCE) 25 MG TABS TABLET    Take one tablet by mouth once daily  FLUTICASONE (FLONASE) 50 MCG/ACT NASAL SPRAY    Place 1 spray into both nostrils as needed for allergies or rhinitis.   GLUCOSE BLOOD (ONETOUCH VERIO) TEST STRIP    Use to test blood sugar twice daily Dx: E11.69   LANCET DEVICES (ONE TOUCH DELICA LANCING DEV) MISC    by Does not apply route. Check blood sugar 2 x daily as directed DX: 250.02   LISINOPRIL (PRINIVIL,ZESTRIL) 10 MG TABLET    TAKE 1 TABLET BY MOUTH EVERY DAY   METFORMIN (GLUCOPHAGE) 1000 MG TABLET    Take 1 tablet (1,000 mg total) by mouth 2 (two) times daily  with a meal.   PIOGLITAZONE (ACTOS) 15 MG TABLET    Take 1 tablet (15 mg total) by mouth daily.   POTASSIUM CITRATE (UROCIT-K) 10 MEQ (1080 MG) SR TABLET    2 by mouth once daily to prevent kidney stones  Modified Medications   No medications on file  Discontinued Medications   No medications on file     Physical Exam:  Vitals:   10/28/17 1029  BP: 118/78  Pulse: 78  Temp: 98.2 F (36.8 C)  TempSrc: Oral  SpO2: 96%  Weight: 240 lb (108.9 kg)  Height:  (1.753 m)   Body mass index is 35.44 kg/m.  Physical Exam  Constitutional: He is oriented to person, place, and time. He appears well-developed and well-nourished. No distress.  HENT:  Head: Normocephalic and atraumatic.  Eyes: Pupils are equal, round, and reactive to light. Conjunctivae and EOM are normal.  Neck: Normal range of motion. Neck supple.  Cardiovascular: Normal rate, regular rhythm and normal heart sounds.  Pulmonary/Chest: Effort normal and breath sounds normal.  Abdominal: Soft. Bowel sounds are normal. He exhibits no distension. There is no tenderness.  Musculoskeletal: He exhibits no edema or tenderness.  Neurological: He is alert and oriented to person, place, and time.  Skin: Skin is warm and dry. He is not diaphoretic.  Psychiatric: He has a normal mood and affect.    Labs reviewed: Basic Metabolic Panel: Recent Labs    01/16/17 0909 04/22/17 07/18/17 0806 10/24/17 0818  NA 140 140 136 138  K 4.7 4.6 4.3 4.5  CL 101  --  97* 103  CO2 20  --  26 25  GLUCOSE 173*  --  169* 134*  BUN CREATININE 0.98 1.0 0.90 0.87  CALCIUM 9.7  --  9.7 9.6   Liver Function Tests: Recent Labs    01/16/17 01/16/17 0909 04/22/17 07/18/17 0806 10/24/17 0818  AST 61* 61* 38 57* 37*  ALT 76* 76* 50* 74* 47*  ALKPHOS 102 102 91  --   --   BILITOT  --  0.6  --  1.0 0.7  PROT  --  7.6  --  7.1 7.0  ALBUMIN  --  4.6  --   --   --    No results for input(s): LIPASE, AMYLASE in the last 8760  hours. No results for input(s): AMMONIA in the last 8760 hours. CBC: Recent Labs    10/24/17 0818  WBC 8.3  NEUTROABS 5,221  HGB 15.4  HCT 43.8  MCV 87.3  PLT 235   Lipid Panel: Recent Labs    07/18/17 0806  CHOL 132  HDL 28*  LDLCALC 67  TRIG 960*  CHOLHDL 4.7   TSH: No results for input(s): TSH in the last 8760 hours. A1C: Lab Results  Component Value Date   HGBA1C  6.9 (H) 10/24/2017     Assessment/Plan 1. Diabetes mellitus type 2 in obese (HCC) A1c improved on diet modifications and medication. Will cont current medications and encouraged to continue diet and increase exercise.  - Hemoglobin A1c; Future  2. Essential hypertension -controlled on lisinopril 10 mg by mouth daily.   3. Elevated liver enzymes -mildly elevated, improved from previous. Minimal ETOH intake and does not use tylenol.   4. Hyperlipidemia LDL goal <70 -continues on Lipitor 10 mg daily with dietary modifications.  - Lipid Panel; Future - COMPLETE METABOLIC PANEL WITH GFR; Future  5. Seasonal allergies Controlled on cetirizine 10 mg daily, uses flonase as needed   Next appt: 03/23/2018 Shanda Bumps K. Biagio Borg  [facility] [phone]

## 2017-12-14 ENCOUNTER — Other Ambulatory Visit: Payer: Self-pay | Admitting: Internal Medicine

## 2017-12-15 ENCOUNTER — Other Ambulatory Visit: Payer: Self-pay | Admitting: *Deleted

## 2017-12-15 MED ORDER — DULAGLUTIDE 1.5 MG/0.5ML ~~LOC~~ SOAJ: 1.5000 mg | SUBCUTANEOUS | 4 refills | Status: DC

## 2017-12-15 NOTE — Telephone Encounter (Signed)
[facility]  

## 2018-01-03 ENCOUNTER — Other Ambulatory Visit: Payer: Self-pay | Admitting: Nurse Practitioner

## 2018-01-03 DIAGNOSIS — E669 Obesity, unspecified: Principal | ICD-10-CM

## 2018-01-03 DIAGNOSIS — E1169 Type 2 diabetes mellitus with other specified complication: Secondary | ICD-10-CM

## 2018-01-23 ENCOUNTER — Ambulatory Visit: Payer: Managed Care, Other (non HMO) | Admitting: Internal Medicine

## 2018-03-10 ENCOUNTER — Other Ambulatory Visit: Payer: Self-pay | Admitting: Internal Medicine

## 2018-03-23 ENCOUNTER — Other Ambulatory Visit: Payer: Managed Care, Other (non HMO)

## 2018-03-23 DIAGNOSIS — E1169 Type 2 diabetes mellitus with other specified complication: Secondary | ICD-10-CM

## 2018-03-23 DIAGNOSIS — E669 Obesity, unspecified: Principal | ICD-10-CM

## 2018-03-23 DIAGNOSIS — E785 Hyperlipidemia, unspecified: Secondary | ICD-10-CM

## 2018-03-24 LAB — COMPLETE METABOLIC PANEL WITH GFR
AG RATIO: 1.9 (calc) (ref 1.0–2.5)
ALT: 44 U/L (ref 9–46)
AST: 30 U/L (ref 10–35)
Albumin: 4.5 g/dL (ref 3.6–5.1)
Alkaline phosphatase (APISO): 74 U/L (ref 40–115)
BILIRUBIN TOTAL: 0.7 mg/dL (ref 0.2–1.2)
BUN: 14 mg/dL (ref 7–25)
CALCIUM: 9.8 mg/dL (ref 8.6–10.3)
CO2: 26 mmol/L (ref 20–32)
Chloride: 101 mmol/L (ref 98–110)
Creat: 0.88 mg/dL (ref 0.70–1.33)
GFR, EST NON AFRICAN AMERICAN: 96 mL/min/{1.73_m2} (ref >=60)
GFR, Est African American: 111 mL/min/{1.73_m2} (ref >=60)
Globulin: 2.4 g/dL (calc) (ref 1.9–3.7)
Glucose, Bld: 132 mg/dL — ABNORMAL HIGH (ref 65–99)
POTASSIUM: 4.5 mmol/L (ref 3.5–5.3)
SODIUM: 137 mmol/L (ref 135–146)
Total Protein: 6.9 g/dL (ref 6.1–8.1)

## 2018-03-24 LAB — HEMOGLOBIN A1C
EAG (MMOL/L): 8.4 (calc)
Hgb A1c MFr Bld: 6.9 % of total Hgb — ABNORMAL HIGH (ref <5.7)
MEAN PLASMA GLUCOSE: 151 (calc)

## 2018-03-24 LAB — LIPID PANEL
Cholesterol: 146 mg/dL (ref <200)
HDL: 33 mg/dL — AB (ref >40)
LDL Cholesterol (Calc): 82 mg/dL (calc)
NON-HDL CHOLESTEROL (CALC): 113 mg/dL (ref <130)
Total CHOL/HDL Ratio: 4.4 (calc) (ref <5.0)
Triglycerides: 223 mg/dL — ABNORMAL HIGH (ref <150)

## 2018-03-30 ENCOUNTER — Ambulatory Visit: Payer: Managed Care, Other (non HMO) | Admitting: Nurse Practitioner

## 2018-03-30 ENCOUNTER — Encounter: Payer: Self-pay | Admitting: Nurse Practitioner

## 2018-03-30 VITALS — BP 122/74 | HR 73 | Temp 98.6°F | Ht 69.0 in | Wt 237.2 lb

## 2018-03-30 DIAGNOSIS — E1169 Type 2 diabetes mellitus with other specified complication: Secondary | ICD-10-CM | POA: Diagnosis not present

## 2018-03-30 DIAGNOSIS — I10 Essential (primary) hypertension: Secondary | ICD-10-CM | POA: Diagnosis not present

## 2018-03-30 DIAGNOSIS — Z6835 Body mass index (BMI) 35.0-35.9, adult: Secondary | ICD-10-CM

## 2018-03-30 DIAGNOSIS — E785 Hyperlipidemia, unspecified: Secondary | ICD-10-CM | POA: Diagnosis not present

## 2018-03-30 DIAGNOSIS — E669 Obesity, unspecified: Secondary | ICD-10-CM

## 2018-03-30 DIAGNOSIS — J0140 Acute pansinusitis, unspecified: Secondary | ICD-10-CM

## 2018-03-30 DIAGNOSIS — J302 Other seasonal allergic rhinitis: Secondary | ICD-10-CM | POA: Diagnosis not present

## 2018-03-30 MED ORDER — METFORMIN HCL 1000 MG PO TABS: 1000.0000 mg | ORAL_TABLET | Freq: Two times a day (BID) | ORAL | 1 refills | Status: DC

## 2018-03-30 MED ORDER — PIOGLITAZONE HCL 15 MG PO TABS: 15.0000 mg | ORAL_TABLET | Freq: Every day | ORAL | 1 refills | Status: DC

## 2018-03-30 MED ORDER — EMPAGLIFLOZIN 25 MG PO TABS: ORAL_TABLET | ORAL | 1 refills | Status: DC

## 2018-03-30 MED ORDER — ZOSTER VAC RECOMB ADJUVANTED 50 MCG/0.5ML IM SUSR: 0.5000 mL | Freq: Once | INTRAMUSCULAR | 1 refills | Status: AC

## 2018-03-30 MED ORDER — AMOXICILLIN-POT CLAVULANATE 875-125 MG PO TABS: 1.0000 | ORAL_TABLET | Freq: Two times a day (BID) | ORAL | 0 refills | Status: DC

## 2018-03-30 NOTE — Patient Instructions (Addendum)
To use flonase routinely twice daily for 7 days To start augmentin, take with full glass of water and use probiotic while on antibiotic To call if symptoms fail to improve or worsen   Fat and Cholesterol Restricted Diet Getting too much fat and cholesterol in your diet may cause health problems. Following this diet helps keep your fat and cholesterol at normal levels. This can keep you from getting sick. What types of fat should I choose?  Choose monosaturated and polyunsaturated fats. These are found in foods such as olive oil, canola oil, flaxseeds, walnuts, almonds, and seeds.  Eat more omega-3 fats. Good choices include salmon, mackerel, sardines, tuna, flaxseed oil, and ground flaxseeds.  Limit saturated fats. These are in animal products such as meats, butter, and cream. They can also be in plant products such as palm oil, palm kernel oil, and coconut oil.  Avoid foods with partially hydrogenated oils in them. These contain trans fats. Examples of foods that have trans fats are stick margarine, some tub margarines, cookies, crackers, and other baked goods. What general guidelines do I need to follow?  Check food labels. Look for the words "trans fat" and "saturated fat."  When preparing a meal: ? Fill half of your plate with vegetables and green salads. ? Fill one fourth of your plate with whole grains. Look for the word "whole" as the first word in the ingredient list. ? Fill one fourth of your plate with lean protein foods.  Eat more foods that have fiber, like apples, carrots, beans, peas, and barley.  Eat more home-cooked foods. Eat less at restaurants and buffets.  Limit or avoid alcohol.  Limit foods high in starch and sugar.  Limit fried foods.  Cook foods without frying them. Baking, boiling, grilling, and broiling are all great options.  Lose weight if you are overweight. Losing even a small amount of weight can help your overall health. It can also help prevent  diseases such as diabetes and heart disease. What foods can I eat? Grains Whole grains, such as whole wheat or whole grain breads, crackers, cereals, and pasta. Unsweetened oatmeal, bulgur, barley, quinoa, or brown rice. Corn or whole wheat flour tortillas. Vegetables Fresh or frozen vegetables (raw, steamed, roasted, or grilled). Green salads. Fruits All fresh, canned (in natural juice), or frozen fruits. Meat and Other Protein Products Ground beef (85% or leaner), grass-fed beef, or beef trimmed of fat. Skinless chicken or [place]. Ground chicken or [place]. Pork trimmed of fat. All fish and seafood. Eggs. Dried beans, peas, or lentils. Unsalted nuts or seeds. Unsalted canned or dry beans. Dairy Low-fat dairy products, such as skim or 1% milk, 2% or reduced-fat cheeses, low-fat ricotta or cottage cheese, or plain low-fat yogurt. Fats and Oils Tub margarines without trans fats. Light or reduced-fat mayonnaise and salad dressings. Avocado. Olive, canola, sesame, or safflower oils. Natural peanut or almond butter (choose ones without added sugar and oil). The items listed above may not be a complete list of recommended foods or beverages. Contact your dietitian for more options. What foods are not recommended? Grains White bread. White pasta. White rice. Cornbread. Bagels, pastries, and croissants. Crackers that contain trans fat. Vegetables White potatoes. Corn. Creamed or fried vegetables. Vegetables in a cheese sauce. Fruits Dried fruits. Canned fruit in light or heavy syrup. Fruit juice. Meat and Other Protein Products Fatty cuts of meat. Ribs, chicken wings, bacon, sausage, bologna, salami, chitterlings, fatback, hot dogs, bratwurst, and packaged luncheon meats. Liver and organ meats.  Dairy Whole or 2% milk, cream, half-and-half, and cream cheese. Whole milk cheeses. Whole-fat or sweetened yogurt. Full-fat cheeses. Nondairy creamers and whipped toppings. Processed cheese, cheese spreads,  or cheese curds. Sweets and Desserts Corn syrup, sugars, honey, and molasses. Candy. Jam and jelly. Syrup. Sweetened cereals. Cookies, pies, cakes, donuts, muffins, and ice cream. Fats and Oils Butter, stick margarine, lard, shortening, ghee, or bacon fat. Coconut, palm kernel, or palm oils. Beverages Alcohol. Sweetened drinks (such as sodas, lemonade, and fruit drinks or punches). The items listed above may not be a complete list of foods and beverages to avoid. Contact your dietitian for more information. This information is not intended to replace advice given to you by your health care provider. Make sure you discuss any questions you have with your health care provider. Document Released: 11/19/2011 Document Revised: 01/25/2016 Document Reviewed: 08/19/2013 Elsevier Interactive Patient Education  [employer].

## 2018-03-30 NOTE — Progress Notes (Signed)
Careteam: Patient Care Team: [name], NP as PCP - General (Nurse Practitioner) [name], OD as Consulting Physician (Optometry) [name], MD as Consulting Physician (Urology)  Advanced Directive information    Allergies  Allergen Reactions  . Hydrocodone Nausea And Vomiting  . Oxycodone Nausea And Vomiting    Chief Complaint  Patient presents with  . Medical Management of Chronic Issues    Pt is being seen for a 5 month routine visit. pt has been having ringing in left ear for about 2 weeks.   . Medication Refill    Rx pended  . Depression    score of 0  . audit c screening    Score of 5   HPI: Patient is a 57 y.o. male seen in the office today for routine follow up.  Shingles vaccine was sent to pharmacy but never got a call to get it, requested printed rx.   DM- rough 2 months, reports he has been doing better recently. Pt currently taking trulicity, jardiance, metformin and actos     His mother was diagnosised with pancreatic cancer son got deployed Mom has had pneumonia.  Finished chemo and now going in for radiation. Plans to start walking again but it has been stressful. He is having to go with his mom to all appts.   Feels like there is fluid in his left ear, now it feels like there is white noise in there all the time.  No pain.  No dizziness or headache.  Increase congestion and allergies. ?fall allergies.  Taking zyrtec routinely, every now and then Flonase. Does have increased sinus pressure on the left side of face  Review of Systems:  Review of Systems  Constitutional: Negative for chills, fever and weight loss.  HENT: Positive for congestion. Negative for ear pain, hearing loss, sinus pain, sore throat and tinnitus.   Respiratory: Negative for cough, sputum production and shortness of breath.   Cardiovascular: Negative for chest pain, palpitations and leg swelling.  Gastrointestinal: Negative for abdominal pain, constipation,  diarrhea and heartburn.  Genitourinary: Negative for dysuria, frequency and urgency.  Musculoskeletal: Negative for back pain, joint pain and myalgias.  Skin: Negative.   Neurological: Negative for dizziness and headaches.  Endo/Heme/Allergies: Positive for environmental allergies (controlled).    Past Medical History:  Diagnosis Date  . Diabetes mellitus without complication (HCC)    type 2  . History of kidney stones   . Hyperlipidemia   . Hypertension   . Seasonal allergies    Past Surgical History:  Procedure Laterality Date  . LITHOTRIPSY  12/2012  . TONSILLECTOMY    . wisdomteeth extraction     Social History:   reports that he quit smoking about 27 years ago. His smoking use included cigarettes. He has a 1.50 pack-year smoking history. He has never used smokeless tobacco. He reports that he drinks about 4.0 standard drinks of alcohol per week. He reports that he does not use drugs.  Family History  Problem Relation Age of Onset  . Colon cancer Neg Hx   . Rectal cancer Neg Hx   . Stomach cancer Neg Hx   . Esophageal cancer Neg Hx     Medications: Patient's Medications  New Prescriptions   No medications on file  Previous Medications   ASPIRIN 81 MG TABLET    Take 81 mg by mouth daily.   ATORVASTATIN (LIPITOR) 10 MG TABLET    TAKE 1 TABLET BY MOUTH EVERY DAY  CETIRIZINE (ZYRTEC) 10 MG TABLET    Take 10 mg by mouth daily.   DULAGLUTIDE (TRULICITY) 1.5 MG/0.5ML SOPN    Inject 1.5 mg into the skin once a week.   EMPAGLIFLOZIN (JARDIANCE) 25 MG TABS TABLET    Take one tablet by mouth once daily   FLUTICASONE (FLONASE) 50 MCG/ACT NASAL SPRAY    Place 1 spray into both nostrils as needed for allergies or rhinitis.   GLUCOSE BLOOD (ONETOUCH VERIO) TEST STRIP    Use to test blood sugar twice daily Dx: E11.69   LANCET DEVICES (ONE TOUCH DELICA LANCING DEV) MISC    by Does not apply route. Check blood sugar 2 x daily as directed DX: 250.02   LISINOPRIL (PRINIVIL,ZESTRIL) 10  MG TABLET    TAKE 1 TABLET BY MOUTH EVERY DAY   METFORMIN (GLUCOPHAGE) 1000 MG TABLET    TAKE 1 TABLET (1,000 MG TOTAL) BY MOUTH 2 (TWO) TIMES DAILY WITH A MEAL.   PIOGLITAZONE (ACTOS) 15 MG TABLET    TAKE 1 TABLET BY MOUTH EVERY DAY   POTASSIUM CITRATE (UROCIT-K) 10 MEQ (1080 MG) SR TABLET    2 by mouth once daily to prevent kidney stones  Modified Medications   No medications on file  Discontinued Medications   No medications on file     Physical Exam:  Vitals:   03/30/18 0822  BP: 122/74  Pulse: 73  Temp: 98.6 F (37 C)  TempSrc: Oral  SpO2: 96%  Weight: 237 lb 3.2 oz (107.6 kg)  Height: 5\' 9"  (1.753 m)   Body mass index is 35.03 kg/m.  Physical Exam  Constitutional: He is oriented to person, place, and time. He appears well-developed and well-nourished. No distress.  HENT:  Head: Normocephalic and atraumatic.  Right Ear: External ear normal. Tympanic membrane is not injected. No middle ear effusion.  Left Ear: External ear normal. Tympanic membrane is not injected.  No middle ear effusion.  Nose: Nose normal.  Mouth/Throat: Oropharynx is clear and moist. No oropharyngeal exudate.  Eyes: Pupils are equal, round, and reactive to light. Conjunctivae and EOM are normal.  Neck: Normal range of motion. Neck supple.  Cardiovascular: Normal rate, regular rhythm and normal heart sounds.  Pulmonary/Chest: Effort normal and breath sounds normal.  Abdominal: Soft. Bowel sounds are normal. He exhibits no distension. There is no tenderness.  Musculoskeletal: He exhibits no edema or tenderness.  Neurological: He is alert and oriented to person, place, and time.  Skin: Skin is warm and dry. He is not diaphoretic.  Psychiatric: He has a normal mood and affect.    Labs reviewed: Basic Metabolic Panel: Recent Labs    07/18/17 0806 10/24/17 0818 03/23/18 0816  NA 136 138 137  K 4.3 4.5 4.5  CL 97* 103 101  CO2 26 25 26   GLUCOSE 169* 134* 132*  BUN 14 18 14   CREATININE 0.90  0.87 0.88  CALCIUM 9.7 9.6 9.8   Liver Function Tests: Recent Labs    04/22/17 07/18/17 0806 10/24/17 0818 03/23/18 0816  AST 38 57* 37* 30  ALT 50* 74* 47* 44  ALKPHOS 91  --   --   --   BILITOT  --  1.0 0.7 0.7  PROT  --  7.1 7.0 6.9   No results for input(s): LIPASE, AMYLASE in the last 8760 hours. No results for input(s): AMMONIA in the last 8760 hours. CBC: Recent Labs    10/24/17 0818  WBC 8.3  NEUTROABS 5,221  HGB 15.4  HCT 43.8  MCV 87.3  PLT 235   Lipid Panel: Recent Labs    07/18/17 0806 03/23/18 0816  CHOL 132 146  HDL 28* 33*  LDLCALC 67 82  TRIG 285* 223*  CHOLHDL 4.7 4.4   TSH: No results for input(s): TSH in the last 8760 hours. A1C: Lab Results  Component Value Date   HGBA1C 6.9 (H) 03/23/2018     Assessment/Plan 1. Diabetes mellitus type 2 in obese (HCC) -A1c 6.9, pt planning to do better with diet and exercise. Continue current medications.  - empagliflozin (JARDIANCE) 25 MG TABS tablet; Take one tablet by mouth once daily  Dispense: 90 tablet; Refill: 1 - pioglitazone (ACTOS) 15 MG tablet; Take 1 tablet (15 mg total) by mouth daily.  Dispense: 90 tablet; Refill: 1 - metFORMIN (GLUCOPHAGE) 1000 MG tablet; Take 1 tablet (1,000 mg total) by mouth 2 (two) times daily with a meal.  Dispense: 180 tablet; Refill: 1  2. Essential hypertension Stable, will continue current regimen   3. Hyperlipidemia LDL goal <70 Not at goal however pt would like to work on diet before further adjustment to medication made. Will continue lipitor 10 mg daily  4. Seasonal allergies Stable, continues on zyrtec daily  5. Acute non-recurrent pansinusitis -increased pressure to left side of face.  -no decrease in hearing but increase "noise" to left ear. -to use flonase BID for 7 days - amoxicillin-clavulanate (AUGMENTIN) 875-125 MG tablet; Take 1 tablet by mouth 2 (two) times daily.  Dispense: 20 tablet; Refill: 0  6. Body mass index (BMI) of 35.0-35.9 in  adult Noted today.  7. Class 2 severe obesity with serious comorbidity and body mass index (BMI) of 35.0 to 35.9 in adult, unspecified obesity type (HCC) Discussed diet and exercise modifications.   Next appt: 09/22/2018  Janene Harvey. Biagio Borg  [facility] [phone]

## 2018-04-06 ENCOUNTER — Other Ambulatory Visit: Payer: Self-pay | Admitting: Nurse Practitioner

## 2018-04-09 ENCOUNTER — Telehealth: Payer: Self-pay

## 2018-04-09 DIAGNOSIS — H9209 Otalgia, unspecified ear: Secondary | ICD-10-CM

## 2018-04-09 NOTE — Telephone Encounter (Signed)
Referral placed.

## 2018-04-09 NOTE — Telephone Encounter (Signed)
Patient called to ask for an ENT referral due to continued left ear pain. He stated that he has finished the antibiotic that he was given and he is still having left ear pain. He stated that an ENT referral was discussed at his appointment last week and he would like to move forward with that referral.    Referral pended to provider

## 2018-04-09 NOTE — Telephone Encounter (Signed)
I left a detailed message for patient explaining that ENT referral has been placed. I asked that patient call the office if he has any questions.

## 2018-05-31 ENCOUNTER — Other Ambulatory Visit: Payer: Self-pay | Admitting: Nurse Practitioner

## 2018-09-15 ENCOUNTER — Other Ambulatory Visit: Payer: Self-pay | Admitting: Internal Medicine

## 2018-09-22 ENCOUNTER — Other Ambulatory Visit: Payer: Managed Care, Other (non HMO)

## 2018-09-22 ENCOUNTER — Other Ambulatory Visit: Payer: Self-pay

## 2018-09-22 DIAGNOSIS — E669 Obesity, unspecified: Secondary | ICD-10-CM

## 2018-09-22 DIAGNOSIS — E785 Hyperlipidemia, unspecified: Secondary | ICD-10-CM

## 2018-09-22 DIAGNOSIS — E1169 Type 2 diabetes mellitus with other specified complication: Secondary | ICD-10-CM

## 2018-09-23 LAB — HEMOGLOBIN A1C
Hgb A1c MFr Bld: 7.5 % of total Hgb — ABNORMAL HIGH (ref <5.7)
Mean Plasma Glucose: 169 (calc)
eAG (mmol/L): 9.3 (calc)

## 2018-09-23 LAB — COMPLETE METABOLIC PANEL WITH GFR
AG Ratio: 1.9 (calc) (ref 1.0–2.5)
ALT: 62 U/L — ABNORMAL HIGH (ref 9–46)
AST: 53 U/L — ABNORMAL HIGH (ref 10–35)
Albumin: 4.7 g/dL (ref 3.6–5.1)
Alkaline phosphatase (APISO): 75 U/L (ref 35–144)
BUN: 14 mg/dL (ref 7–25)
CO2: 26 mmol/L (ref 20–32)
Calcium: 10 mg/dL (ref 8.6–10.3)
Chloride: 102 mmol/L (ref 98–110)
Creat: 0.94 mg/dL (ref 0.70–1.33)
GFR, Est African American: 104 mL/min/{1.73_m2} (ref >=60)
GFR, Est Non African American: 90 mL/min/{1.73_m2} (ref >=60)
Globulin: 2.5 g/dL (calc) (ref 1.9–3.7)
Glucose, Bld: 147 mg/dL — ABNORMAL HIGH (ref 65–99)
Potassium: 4.3 mmol/L (ref 3.5–5.3)
Sodium: 138 mmol/L (ref 135–146)
Total Bilirubin: 1.1 mg/dL (ref 0.2–1.2)
Total Protein: 7.2 g/dL (ref 6.1–8.1)

## 2018-09-23 LAB — LIPID PANEL
Cholesterol: 162 mg/dL (ref <200)
HDL: 32 mg/dL — ABNORMAL LOW (ref >=40)
LDL Cholesterol (Calc): 89 mg/dL (calc)
Non-HDL Cholesterol (Calc): 130 mg/dL (calc) — ABNORMAL HIGH (ref <130)
Total CHOL/HDL Ratio: 5.1 (calc) — ABNORMAL HIGH (ref <5.0)
Triglycerides: 284 mg/dL — ABNORMAL HIGH (ref <150)

## 2018-09-23 LAB — CBC WITH DIFFERENTIAL/PLATELET
Absolute Monocytes: 502 cells/uL (ref 200–950)
Basophils Absolute: 73 cells/uL (ref 0–200)
Basophils Relative: 0.9 %
Eosinophils Absolute: 211 cells/uL (ref 15–500)
Eosinophils Relative: 2.6 %
HCT: 45.9 % (ref 38.5–50.0)
Hemoglobin: 15.7 g/dL (ref 13.2–17.1)
Lymphs Abs: 2147 cells/uL (ref 850–3900)
MCH: 30.7 pg (ref 27.0–33.0)
MCHC: 34.2 g/dL (ref 32.0–36.0)
MCV: 89.8 fL (ref 80.0–100.0)
MPV: 9.7 fL (ref 7.5–12.5)
Monocytes Relative: 6.2 %
Neutro Abs: 5168 cells/uL (ref 1500–7800)
Neutrophils Relative %: 63.8 %
Platelets: 249 10*3/uL (ref 140–400)
RBC: 5.11 10*6/uL (ref 4.20–5.80)
RDW: 12.3 % (ref 11.0–15.0)
Total Lymphocyte: 26.5 %
WBC: 8.1 10*3/uL (ref 3.8–10.8)

## 2018-09-28 ENCOUNTER — Other Ambulatory Visit: Payer: Self-pay

## 2018-09-28 ENCOUNTER — Encounter: Payer: Self-pay | Admitting: Nurse Practitioner

## 2018-09-28 ENCOUNTER — Ambulatory Visit (INDEPENDENT_AMBULATORY_CARE_PROVIDER_SITE_OTHER): Payer: Managed Care, Other (non HMO) | Admitting: Nurse Practitioner

## 2018-09-28 DIAGNOSIS — R748 Abnormal levels of other serum enzymes: Secondary | ICD-10-CM

## 2018-09-28 DIAGNOSIS — E669 Obesity, unspecified: Secondary | ICD-10-CM

## 2018-09-28 DIAGNOSIS — I10 Essential (primary) hypertension: Secondary | ICD-10-CM | POA: Diagnosis not present

## 2018-09-28 DIAGNOSIS — J302 Other seasonal allergic rhinitis: Secondary | ICD-10-CM

## 2018-09-28 DIAGNOSIS — E785 Hyperlipidemia, unspecified: Secondary | ICD-10-CM

## 2018-09-28 DIAGNOSIS — E1169 Type 2 diabetes mellitus with other specified complication: Secondary | ICD-10-CM

## 2018-09-28 MED ORDER — FLUTICASONE PROPIONATE 50 MCG/ACT NA SUSP: 1.0000 | NASAL | 3 refills | Status: AC | PRN

## 2018-09-28 MED ORDER — ATORVASTATIN CALCIUM 10 MG PO TABS: 10.0000 mg | ORAL_TABLET | Freq: Every day | ORAL | 1 refills | Status: DC

## 2018-09-28 MED ORDER — DULAGLUTIDE 1.5 MG/0.5ML ~~LOC~~ SOAJ: SUBCUTANEOUS | 1 refills | Status: DC

## 2018-09-28 MED ORDER — ONETOUCH DELICA LANCING DEV MISC: 3 refills | Status: AC

## 2018-09-28 MED ORDER — GLUCOSE BLOOD VI STRP: ORAL_STRIP | 12 refills | Status: AC

## 2018-09-28 MED ORDER — METFORMIN HCL 1000 MG PO TABS: 1000.0000 mg | ORAL_TABLET | Freq: Two times a day (BID) | ORAL | 1 refills | Status: DC

## 2018-09-28 MED ORDER — EMPAGLIFLOZIN 25 MG PO TABS: ORAL_TABLET | ORAL | 1 refills | Status: DC

## 2018-09-28 MED ORDER — LISINOPRIL 10 MG PO TABS: 10.0000 mg | ORAL_TABLET | Freq: Every day | ORAL | 2 refills | Status: DC

## 2018-09-28 MED ORDER — PIOGLITAZONE HCL 15 MG PO TABS: 15.0000 mg | ORAL_TABLET | Freq: Every day | ORAL | 1 refills | Status: DC

## 2018-09-28 NOTE — Patient Instructions (Signed)
To avoid alcohol at this time Referral placed to GI for further evaluation Follow up in 3 months with lab work prior to visit.   Heart-Healthy Eating Plan Many factors influence your heart (coronary) health, including eating and exercise habits. Coronary risk increases with abnormal blood fat (lipid) levels. Heart-healthy meal planning includes limiting unhealthy fats, increasing healthy fats, and making other diet and lifestyle changes. What is my plan? Your health care provider may recommend that you:  Limit your fat intake to _________% or less of your total calories each day.  Limit your saturated fat intake to _________% or less of your total calories each day.  Limit the amount of cholesterol in your diet to less than _________ mg per day. What are tips for following this plan? Cooking Cook foods using methods other than frying. Baking, boiling, grilling, and broiling are all good options. Other ways to reduce fat include:  Removing the skin from poultry.  Removing all visible fats from meats.  Steaming vegetables in water or broth. Meal planning   At meals, imagine dividing your plate into fourths: ? Fill one-half of your plate with vegetables and green salads. ? Fill one-fourth of your plate with whole grains. ? Fill one-fourth of your plate with lean protein foods.  Eat 4-5 servings of vegetables per day. One serving equals 1 cup raw or cooked vegetable, or 2 cups raw leafy greens.  Eat 4-5 servings of fruit per day. One serving equals 1 medium whole fruit,  cup dried fruit,  cup fresh, frozen, or canned fruit, or  cup 100% fruit juice.  Eat more foods that contain soluble fiber. Examples include apples, broccoli, carrots, beans, peas, and barley. Aim to get 25-30 g of fiber per day.  Increase your consumption of legumes, nuts, and seeds to 4-5 servings per week. One serving of dried beans or legumes equals  cup cooked, 1 serving of nuts is  cup, and 1 serving of  seeds equals 1 tablespoon. Fats  Choose healthy fats more often. Choose monounsaturated and polyunsaturated fats, such as olive and canola oils, flaxseeds, walnuts, almonds, and seeds.  Eat more omega-3 fats. Choose salmon, mackerel, sardines, tuna, flaxseed oil, and ground flaxseeds. Aim to eat fish at least 2 times each week.  Check food labels carefully to identify foods with trans fats or high amounts of saturated fat.  Limit saturated fats. These are found in animal products, such as meats, butter, and cream. Plant sources of saturated fats include palm oil, palm kernel oil, and coconut oil.  Avoid foods with partially hydrogenated oils in them. These contain trans fats. Examples are stick margarine, some tub margarines, cookies, crackers, and other baked goods.  Avoid fried foods. General information  Eat more home-cooked food and less restaurant, buffet, and fast food.  Limit or avoid alcohol.  Limit foods that are high in starch and sugar.  Lose weight if you are overweight. Losing just 5-10% of your body weight can help your overall health and prevent diseases such as diabetes and heart disease.  Monitor your salt (sodium) intake, especially if you have high blood pressure. Talk with your health care provider about your sodium intake.  Try to incorporate more vegetarian meals weekly. What foods can I eat? Fruits All fresh, canned (in natural juice), or frozen fruits. Vegetables Fresh or frozen vegetables (raw, steamed, roasted, or grilled). Green salads. Grains Most grains. Choose whole wheat and whole grains most of the time. Rice and pasta, including brown rice and  pastas made with whole wheat. Meats and other proteins Lean, well-trimmed beef, veal, pork, and lamb. Chicken and [place] without skin. All fish and shellfish. Wild duck, rabbit, pheasant, and venison. Egg whites or low-cholesterol egg substitutes. Dried beans, peas, lentils, and tofu. Seeds and most nuts.  Dairy Low-fat or nonfat cheeses, including ricotta and mozzarella. Skim or 1% milk (liquid, powdered, or evaporated). Buttermilk made with low-fat milk. Nonfat or low-fat yogurt. Fats and oils Non-hydrogenated (trans-free) margarines. Vegetable oils, including soybean, sesame, sunflower, olive, peanut, safflower, corn, canola, and cottonseed. Salad dressings or mayonnaise made with a vegetable oil. Beverages Water (mineral or sparkling). Coffee and tea. Diet carbonated beverages. Sweets and desserts Sherbet, gelatin, and fruit ice. Small amounts of dark chocolate. Limit all sweets and desserts. Seasonings and condiments All seasonings and condiments. The items listed above may not be a complete list of foods and beverages you can eat. Contact a dietitian for more options. What foods are not recommended? Fruits Canned fruit in heavy syrup. Fruit in cream or butter sauce. Fried fruit. Limit coconut. Vegetables Vegetables cooked in cheese, cream, or butter sauce. Fried vegetables. Grains Breads made with saturated or trans fats, oils, or whole milk. Croissants. Sweet rolls. Donuts. High-fat crackers, such as cheese crackers. Meats and other proteins Fatty meats, such as hot dogs, ribs, sausage, bacon, rib-eye roast or steak. High-fat deli meats, such as salami and bologna. Caviar. Domestic duck and goose. Organ meats, such as liver. Dairy Cream, sour cream, cream cheese, and creamed cottage cheese. Whole milk cheeses. Whole or 2% milk (liquid, evaporated, or condensed). Whole buttermilk. Cream sauce or high-fat cheese sauce. Whole-milk yogurt. Fats and oils Meat fat, or shortening. Cocoa butter, hydrogenated oils, palm oil, coconut oil, palm kernel oil. Solid fats and shortenings, including bacon fat, salt pork, lard, and butter. Nondairy cream substitutes. Salad dressings with cheese or sour cream. Beverages Regular sodas and any drinks with added sugar. Sweets and desserts Frosting.  Pudding. Cookies. Cakes. Pies. Milk chocolate or white chocolate. Buttered syrups. Full-fat ice cream or ice cream drinks. The items listed above may not be a complete list of foods and beverages to avoid. Contact a dietitian for more information. Summary  Heart-healthy meal planning includes limiting unhealthy fats, increasing healthy fats, and making other diet and lifestyle changes.  Lose weight if you are overweight. Losing just 5-10% of your body weight can help your overall health and prevent diseases such as diabetes and heart disease.  Focus on eating a balance of foods, including fruits and vegetables, low-fat or nonfat dairy, lean protein, nuts and legumes, whole grains, and heart-healthy oils and fats. This information is not intended to replace advice given to you by your health care provider. Make sure you discuss any questions you have with your health care provider. Document Released: 02/27/2008 Document Revised: 06/27/2017 Document Reviewed: 06/27/2017 Elsevier Interactive Patient Education  2019 [employer].

## 2018-09-28 NOTE — Progress Notes (Signed)
This service is provided via video-telemedicine  No vital signs collected/recorded due to the encounter was a video-telemedicine visit.   Location of patient (ex: home, work):  Home  Patient consents to a telephone visit:  Yes  Location of the provider (ex: office, home):  [employer], office  Names of all persons participating in the video-telemedicine service and their role in the encounter:  Teryl Lucy CMA, Abbey Chatters NP, and patient.   Time spent on call:  8 mins  Virtual Visit via Telephone Note  I connected with Kyle Mcclure on 09/28/18 at  3:45 PM EDT by telephone and verified that I am speaking with the correct person using two identifiers.   I discussed the limitations, risks, security and privacy concerns of performing an evaluation and management service by telephone and the availability of in person appointments. I also discussed with the patient that there may be a patient responsible charge related to this service. The patient expressed understanding and agreed to proceed.      Careteam: Patient Care Team: [name], NP as PCP - General (Nurse Practitioner) [name], OD as Consulting Physician (Optometry) [name], MD as Consulting Physician (Urology)  Advanced Directive information    Allergies  Allergen Reactions  . Hydrocodone Nausea And Vomiting  . Oxycodone Nausea And Vomiting    Chief Complaint  Patient presents with  . Medical Management of Chronic Issues    Follow up      HPI: Patient is a 58 y.o. male  A lot of stress at home. Son is overseas and that is stressful. His mother has pancreatic cancer which has spread.  Not stress eating but does not eat as well as he could  Does not exercise due to pollen season.  Every year (during this time of year) when he is out side he has respiratory symptoms and turns into a URI. Has been taking sinus medication to keep his head clear.   Felt like he is doing good  with diet. T shirts fitting better.   DM- metformin 1000 mg by mouth twice daily, actos 15 mg daily and jardiance 25 mg daily. Reports he has room for diet modifications. Can eat less carbohydrates and less fat.   Elevated Liver enzymes- does not use tylenol, minimal alcohol.  He has hx of elevated liver enzymes last year and decrease ETOH and they improved but currently not drinking ETOH  Elevated liver enzymes- Ultrasound of liver done which showed fatty liver and bilateral renal cyst and he had urologist (Dr Lesly Rubenstein) look at ultrasound and was not concern, no other imagining recommended of kidney. Hx of kidney stones.  Pt reports he had cut back on ETOH, does not drink a lot now due to the fact he has so much stress.  Does not use Tylenol.  Continues on lipitor due to hyperlipidemia.  htn- does not take blood pressure at home.   Seasonal allergies- using flonase 2 sprays daily, saline as needed to nares, zyrtec 10 mg daily (ran out today) if he is without it for a few days his hands will itch.  Labs reviewed with pt.  Review of Systems:  Review of Systems  Constitutional: Negative for chills, fever and weight loss.  HENT: Positive for congestion. Negative for ear pain, hearing loss, sinus pain, sore throat and tinnitus.   Respiratory: Negative for cough, sputum production and shortness of breath.   Cardiovascular: Negative for chest pain, palpitations and leg swelling.  Gastrointestinal: Negative  for abdominal pain, constipation, diarrhea and heartburn.  Genitourinary: Negative for dysuria, frequency and urgency.  Musculoskeletal: Negative for back pain, joint pain and myalgias.  Skin: Negative.   Neurological: Negative for dizziness and headaches.  Endo/Heme/Allergies: Positive for environmental allergies (controlled).    Past Medical History:  Diagnosis Date  . Diabetes mellitus without complication (HCC)    type 2  . History of kidney stones   . Hyperlipidemia   .  Hypertension   . Seasonal allergies    Past Surgical History:  Procedure Laterality Date  . LITHOTRIPSY  12/2012  . TONSILLECTOMY    . wisdomteeth extraction     Social History:   reports that he quit smoking about 28 years ago. His smoking use included cigarettes. He has a 1.50 pack-year smoking history. He has never used smokeless tobacco. He reports current alcohol use of about 4.0 standard drinks of alcohol per week. He reports that he does not use drugs.  Family History  Problem Relation Age of Onset  . Cancer Mother   . Colon cancer Neg Hx   . Rectal cancer Neg Hx   . Stomach cancer Neg Hx   . Esophageal cancer Neg Hx     Medications: Patient's Medications  New Prescriptions   No medications on file  Previous Medications   ASPIRIN 81 MG TABLET    Take 81 mg by mouth daily.   ATORVASTATIN (LIPITOR) 10 MG TABLET    TAKE 1 TABLET BY MOUTH EVERY DAY   CETIRIZINE (ZYRTEC) 10 MG TABLET    Take 10 mg by mouth daily.   EMPAGLIFLOZIN (JARDIANCE) 25 MG TABS TABLET    Take one tablet by mouth once daily   FLUTICASONE (FLONASE) 50 MCG/ACT NASAL SPRAY    Place 1 spray into both nostrils as needed for allergies or rhinitis.   GLUCOSE BLOOD (ONETOUCH VERIO) TEST STRIP    Use to test blood sugar twice daily Dx: E11.69   LANCET DEVICES (ONE TOUCH DELICA LANCING DEV) MISC    by Does not apply route. Check blood sugar 2 x daily as directed DX: 250.02   LISINOPRIL (PRINIVIL,ZESTRIL) 10 MG TABLET    TAKE 1 TABLET BY MOUTH EVERY DAY   METFORMIN (GLUCOPHAGE) 1000 MG TABLET    Take 1 tablet (1,000 mg total) by mouth 2 (two) times daily with a meal.   PIOGLITAZONE (ACTOS) 15 MG TABLET    Take 1 tablet (15 mg total) by mouth daily.   POTASSIUM CITRATE (UROCIT-K) 10 MEQ (1080 MG) SR TABLET    2 by mouth once daily to prevent kidney stones   TRULICITY 1.5 MG/0.5ML SOPN    INJECT 1.5 MG INTO THE SKIN ONCE A WEEK.  Modified Medications   No medications on file  Discontinued Medications    AMOXICILLIN-CLAVULANATE (AUGMENTIN) 875-125 MG TABLET    Take 1 tablet by mouth 2 (two) times daily.     Physical Exam:    Labs reviewed: Basic Metabolic Panel: Recent Labs    10/24/17 0818 03/23/18 0816 09/22/18 0810  NA 138 137 138  K 4.5 4.5 4.3  CL 103 101 102  CO2 25 26 26   GLUCOSE 134* 132* 147*  BUN 18 14 14   CREATININE 0.87 0.88 0.94  CALCIUM 9.6 9.8 10.0   Liver Function Tests: Recent Labs    10/24/17 0818 03/23/18 0816 09/22/18 0810  AST 37* 30 53*  ALT 47* 44 62*  BILITOT 0.7 0.7 1.1  PROT 7.0 6.9 7.2   No  results for input(s): LIPASE, AMYLASE in the last 8760 hours. No results for input(s): AMMONIA in the last 8760 hours. CBC: Recent Labs    10/24/17 0818 09/22/18 0810  WBC 8.3 8.1  NEUTROABS 5,221 5,168  HGB 15.4 15.7  HCT 43.8 45.9  MCV 87.3 89.8  PLT 235 249   Lipid Panel: Recent Labs    03/23/18 0816 09/22/18 0810  CHOL 146 162  HDL 33* 32*  LDLCALC 82 89  TRIG 223* 284*  CHOLHDL 4.4 5.1*   TSH: No results for input(s): TSH in the last 8760 hours. A1C: Lab Results  Component Value Date   HGBA1C 7.5 (H) 09/22/2018     Assessment/Plan 1. Diabetes mellitus type 2 in obese [facility]) Not controlled, reports increase in stressors and could work on diet. Will have him make lifestyle modifications at this time and continue medication for diabetes, may need to start insulin if unable to control with lifestyle changes (diet and increase in physical activity) and current medication)  - Dulaglutide (TRULICITY) 1.5 MG/0.5ML SOPN; INJECT 1.5 MG INTO THE SKIN ONCE A WEEK.  Dispense: 2 pen; Refill: 1 - pioglitazone (ACTOS) 15 MG tablet; Take 1 tablet (15 mg total) by mouth daily.  Dispense: 90 tablet; Refill: 1 - metFORMIN (GLUCOPHAGE) 1000 MG tablet; Take 1 tablet (1,000 mg total) by mouth 2 (two) times daily with a meal.  Dispense: 180 tablet; Refill: 1 - [employer] (ONE TOUCH DELICA LANCING DEV) MISC; Check blood sugar 2 x daily as  directed DX: 250.02  Dispense: 1 each; Refill: 3 - glucose blood (ONETOUCH VERIO) test strip; Use to test blood sugar twice daily Dx: E11.69  Dispense: 100 each; Refill: 12 - empagliflozin (JARDIANCE) 25 MG TABS tablet; Take one tablet by mouth once daily  Dispense: 90 tablet; Refill: 1  2. Elevated liver enzymes Looking back liver enzymes have been elevated since 2015 however more of an increase in AST/ALT noted 01/2017, ultrasound of liver completed which noted fatty liver advised to make dietary modifications and cut back on alcohol, he made modifications at that time and liver enzymes improved with normal lab values 6 months ago. Now elevated again AST 53 and ALT 62. States he has continued to cut back on ETOH use but could make improvements in diet.  Recommended to stop ETOH and tylenol as well. Decrease fat in diet.  -reviewed with Dr Renato Gails and will place Ambulatory referral to Gastroenterology for further evaluation and  further imagining.   3. Essential hypertension -does not check blood pressure at home. Recommended to continue current medication and to follow blood pressure at home to ensure control. -continue dietary modifications.  - lisinopril (ZESTRIL) 10 MG tablet; Take 1 tablet (10 mg total) by mouth daily.  Dispense: 90 tablet; Refill: 2  4. Seasonal allergies -worse this time of year, continues with nasal washes, zyrtec and flonase - fluticasone (FLONASE) 50 MCG/ACT nasal spray; Place 1 spray into both nostrils as needed for allergies or rhinitis.  Dispense: 16 g; Refill: 3  5. Hyperlipidemia LDL goal <70 To work on dietary modifications with low fat diet and increasing physical activity.  - atorvastatin (LIPITOR) 10 MG tablet; Take 1 tablet (10 mg total) by mouth daily.  Dispense: 90 tablet; Refill: 1   Next appt: 3 months with lab work prior to visit.  Janene Harvey. Biagio Borg  [facility] [phone]    Follow Up Instructions:    I  discussed the assessment and treatment plan  with the patient. The patient was provided an opportunity to ask questions and all were answered. The patient agreed with the plan and demonstrated an understanding of the instructions.   The patient was advised to call back or seek an in-person evaluation if the symptoms worsen or if the condition fails to improve as anticipated.  I provided 25 minutes of non-face-to-face time during this encounter.  avs printed and mailed Abbey ChattersJessica Cassi Jenne, NP

## 2018-09-29 ENCOUNTER — Ambulatory Visit: Payer: Managed Care, Other (non HMO) | Admitting: Nurse Practitioner

## 2018-10-09 ENCOUNTER — Ambulatory Visit: Payer: 59 | Admitting: Gastroenterology

## 2018-10-13 ENCOUNTER — Ambulatory Visit (INDEPENDENT_AMBULATORY_CARE_PROVIDER_SITE_OTHER): Payer: Managed Care, Other (non HMO) | Admitting: Gastroenterology

## 2018-10-13 ENCOUNTER — Other Ambulatory Visit: Payer: Self-pay

## 2018-10-13 ENCOUNTER — Encounter: Payer: Self-pay | Admitting: Gastroenterology

## 2018-10-13 VITALS — Ht 69.0 in | Wt 240.0 lb

## 2018-10-13 DIAGNOSIS — R945 Abnormal results of liver function studies: Secondary | ICD-10-CM | POA: Diagnosis not present

## 2018-10-13 DIAGNOSIS — R7989 Other specified abnormal findings of blood chemistry: Secondary | ICD-10-CM

## 2018-10-13 NOTE — Progress Notes (Signed)
This service was provided via virtual visit.  We attempted audio and visual however he was not able to get the visual components working on his device at home and so we went with just audio.  The patient was located at home.  I was located in my office.  The patient did consent to this virtual visit and is aware of possible charges through their insurance for this visit.  The patient is an established patient here to discuss a new problem, referred by his PCP for consult.  My certified medical assistant, Barbaraann RondoKelly Tadros, contributed to this visit by contacting the patient by phone 1 or 2 business days prior to the appointment and also followed up on the recommendations I made after the visit.  Time spent on virtual visit: 25   HPI: This is a very pleasant 58 year old man whom I last saw about 4 years ago.  He was referred today for a new problem by his primary care physician Kyle ChattersJessica Mcclure  I did a screening colonoscopy for him October 2016, routine risk.  The examination was normal and I explained to him that he did not need colon cancer screening again for 10 years.  Blood work April 2020 shows a slightly elevated transaminases.  His AST was 53, his ALT was 62.  It looks like this is been a problem for him for at least 5 years.  I see blood work through the epic system dating back to April 2015 when his AST was 48 and his ALT was 56.  No FH of liver disease except that his mother has metastatic pancreatic cancer.  She lives in [place].  He used to drink fairly regularly.  Currently drinks pretty rarely lately, on beer. Stopped drinking burbon 2-3 months ago.   Currently 2 beers per week.  Never had hepatitis or jaundice.  Overall his weight has been decreasing, he's been dieting, cut back on peanut butter especially (because it's 'just processed sugar').  He is obese with most recent BMI 35.  Abdominal ultrasound August 2018 done for elevated liver tests shows "the hepatic echotexture is  increased diffusely."  He has diabetes which has been under relatively poor control for years.  I see a hemoglobin A1c of 8.6 from 2018  Chief complaint is elevated liver tests  ROS: complete GI ROS as described in HPI, all other review negative.  Constitutional:  No unintentional weight loss   Past Medical History:  Diagnosis Date  . Diabetes mellitus without complication (HCC)    type 2  . History of kidney stones   . Hyperlipidemia   . Hypertension   . Seasonal allergies     Past Surgical History:  Procedure Laterality Date  . LITHOTRIPSY  12/2012  . TONSILLECTOMY    . wisdomteeth extraction      Current Outpatient Medications  Medication Sig Dispense Refill  . aspirin 81 MG tablet Take 81 mg by mouth daily.    Marland Kitchen. atorvastatin (LIPITOR) 10 MG tablet Take 1 tablet (10 mg total) by mouth daily. 90 tablet 1  . cetirizine (ZYRTEC) 10 MG tablet Take 10 mg by mouth daily.    . Dulaglutide (TRULICITY) 1.5 MG/0.5ML SOPN INJECT 1.5 MG INTO THE SKIN ONCE A WEEK. 2 pen 1  . empagliflozin (JARDIANCE) 25 MG TABS tablet Take one tablet by mouth once daily 90 tablet 1  . fluticasone (FLONASE) 50 MCG/ACT nasal spray Place 1 spray into both nostrils as needed for allergies or rhinitis. 16 g 3  .  glucose blood (ONETOUCH VERIO) test strip Use to test blood sugar twice daily Dx: E11.69 100 each 12  . Lanc[employer] Devices (ONE TOUCH DELICA LANCING DEV) MISC Check blood sugar 2 x daily as directed DX: 250.02 1 each 3  . lisinopril (ZESTRIL) 10 MG tablet Take 1 tablet (10 mg total) by mouth daily. 90 tablet 2  . metFORMIN (GLUCOPHAGE) 1000 MG tablet Take 1 tablet (1,000 mg total) by mouth 2 (two) times daily with a meal. 180 tablet 1  . pioglitazone (ACTOS) 15 MG tablet Take 1 tablet (15 mg total) by mouth daily. 90 tablet 1  . potassium citrate (UROCIT-K) 10 MEQ (1080 MG) SR tablet 2 by mouth once daily to prevent kidney stones     No current facility-administered medications for this visit.      Allergies as of 10/13/2018 - Review Complete 10/13/2018  Allergen Reaction Noted  . Hydrocodone Nausea And Vomiting 10/29/2012  . Oxycodone Nausea And Vomiting 10/29/2012    Family History  Problem Relation Age of Onset  . Cancer Mother   . Colon cancer Neg Hx   . Rectal cancer Neg Hx   . Stomach cancer Neg Hx   . Esophageal cancer Neg Hx     Social History   Socioeconomic History  . Marital status: Single    Spouse name: Not on file  . Number of children: Not on file  . Years of education: Not on file  . Highest education level: Not on file  Occupational History  . Not on file  Social Needs  . Financial resource strain: Not on file  . Food insecurity:    Worry: Not on file    Inability: Not on file  . Transportation needs:    Medical: Not on file    Non-medical: Not on file  Tobacco Use  . Smoking status: Former Smoker    Packs/day: 0.50    Years: 3.00    Pack years: 1.50    Types: Cigarettes    Last attempt to quit: 06/03/1990    Years since quitting: 28.3  . Smokeless tobacco: Never Used  Substance and Sexual Activity  . Alcohol use: Yes    Alcohol/week: 4.0 standard drinks    Types: 4 Standard drinks or equivalent per week  . Drug use: No  . Sexual activity: Not on file  Lifestyle  . Physical activity:    Days per week: Not on file    Minutes per session: Not on file  . Stress: Not on file  Relationships  . Social connections:    Talks on phone: Not on file    Gets together: Not on file    Attends religious service: Not on file    Active member of club or organization: Not on file    Attends meetings of clubs or organizations: Not on file    Relationship status: Not on file  . Intimate partner violence:    Fear of current or ex partner: Not on file    Emotionally abused: Not on file    Physically abused: Not on file    Forced sexual activity: Not on file  Other Topics Concern  . Not on file  Social History Narrative  . Not on file      Physical Exam: Unable to perform because this was a "telemed visit" due to current Covid-19 pandemic  Assessment and plan: 58 y.o. male with elevated liver tests  He has mildly elevated transaminases since at least 5 years ago.  I suspect this is fatty liver disease but certainly he needs to undergo a battery of blood tests to check for other potential causes such as hepatitis B, hepatitis C, autoimmune hepatitis, etc.  See the list of labs in patient instructions.  I also will order an up-to-date ultrasound of his liver, his last ultrasound was 2 years ago.  If all of these tests show no other potential causes of his mild LFT transaminase elevation then he understands that this is probably from fatty liver disease and the best thing he could do would be to continue to lose weight.  He is obese with a BMI of 35.  Please see the "Patient Instructions" section for addition details about the plan.  Rob Bunting, MD [facility] 10/13/2018, 9:49 AM

## 2018-10-13 NOTE — Patient Instructions (Addendum)
Hepatitis A (IgM and IgG), Hepatitis B surface antigen, Hepatitis B surface antibody, Hepatitis C antibody, total iron, ferritin, TIBC, ANA, AMA, anti smooth muscle antibody, alpha 1 antitrypsin, cerulloplasm, TTG, total IgA level, CBC, CMET, INR.  RUQ [place] for elevated liver tests  You have been scheduled for an abdominal ultrasound at [facility] on 10/16/18 at 11:10. Please arrive 15 minutes prior to your appointment for registration. Make certain not to have anything to eat or drink 6 hours prior to your appointment. Should you need to reschedule your appointment, please contact radiology at [phone]. This test typically takes about 30 minutes to perform.

## 2018-10-14 ENCOUNTER — Other Ambulatory Visit (INDEPENDENT_AMBULATORY_CARE_PROVIDER_SITE_OTHER): Payer: Managed Care, Other (non HMO)

## 2018-10-14 DIAGNOSIS — R7989 Other specified abnormal findings of blood chemistry: Secondary | ICD-10-CM

## 2018-10-14 DIAGNOSIS — R945 Abnormal results of liver function studies: Secondary | ICD-10-CM | POA: Diagnosis not present

## 2018-10-14 LAB — CBC WITH DIFFERENTIAL/PLATELET
Basophils Absolute: 0.1 10*3/uL (ref 0.0–0.1)
Basophils Relative: 1 % (ref 0.0–3.0)
Eosinophils Absolute: 0.2 10*3/uL (ref 0.0–0.7)
Eosinophils Relative: 2 % (ref 0.0–5.0)
HCT: 50.4 % (ref 39.0–52.0)
Hemoglobin: 17.3 g/dL — ABNORMAL HIGH (ref 13.0–17.0)
Lymphocytes Relative: 21.5 % (ref 12.0–46.0)
Lymphs Abs: 2.2 10*3/uL (ref 0.7–4.0)
MCHC: 34.4 g/dL (ref 30.0–36.0)
MCV: 90.5 fl (ref 78.0–100.0)
Monocytes Absolute: 0.5 10*3/uL (ref 0.1–1.0)
Monocytes Relative: 5.3 % (ref 3.0–12.0)
Neutro Abs: 7 10*3/uL (ref 1.4–7.7)
Neutrophils Relative %: 70.2 % (ref 43.0–77.0)
Platelets: 252 10*3/uL (ref 150.0–400.0)
RBC: 5.57 Mil/uL (ref 4.22–5.81)
RDW: 13 % (ref 11.5–15.5)
WBC: 10.1 10*3/uL (ref 4.0–10.5)

## 2018-10-14 LAB — COMPREHENSIVE METABOLIC PANEL
ALT: 56 U/L — ABNORMAL HIGH (ref 0–53)
AST: 41 U/L — ABNORMAL HIGH (ref 0–37)
Albumin: 4.8 g/dL (ref 3.5–5.2)
Alkaline Phosphatase: 87 U/L (ref 39–117)
BUN: 17 mg/dL (ref 6–23)
CO2: 23 mEq/L (ref 19–32)
Calcium: 10.1 mg/dL (ref 8.4–10.5)
Chloride: 99 mEq/L (ref 96–112)
Creatinine, Ser: 0.88 mg/dL (ref 0.40–1.50)
GFR: 89.12 mL/min (ref >60.00)
Glucose, Bld: 211 mg/dL — ABNORMAL HIGH (ref 70–99)
Potassium: 4.3 mEq/L (ref 3.5–5.1)
Sodium: 136 mEq/L (ref 135–145)
Total Bilirubin: 1.2 mg/dL (ref 0.2–1.2)
Total Protein: 8 g/dL (ref 6.0–8.3)

## 2018-10-14 LAB — PROTIME-INR
INR: 1 ratio (ref 0.8–1.0)
Prothrombin Time: 11.8 s (ref 9.6–13.1)

## 2018-10-14 LAB — IBC + FERRITIN
Ferritin: 28.8 ng/mL (ref 22.0–322.0)
Iron: 130 ug/dL (ref 42–165)
Saturation Ratios: 30.3 % (ref 20.0–50.0)
Transferrin: 306 mg/dL (ref 212.0–360.0)

## 2018-10-14 LAB — IGA: IgA: 241 mg/dL (ref 68–378)

## 2018-10-16 ENCOUNTER — Ambulatory Visit
Admission: RE | Admit: 2018-10-16 | Discharge: 2018-10-16 | Disposition: A | Discharge status: Home / Self Care | Payer: Managed Care, Other (non HMO) | Source: Ambulatory Visit | Attending: Gastroenterology | Admitting: Gastroenterology

## 2018-10-16 DIAGNOSIS — R7989 Other specified abnormal findings of blood chemistry: Secondary | ICD-10-CM

## 2018-10-16 LAB — IRON, TOTAL/TOTAL IRON BINDING CAP
%SAT: 33 % (calc) (ref 20–48)
Iron: 135 ug/dL (ref 50–180)
TIBC: 414 mcg/dL (calc) (ref 250–425)

## 2018-10-16 LAB — IGM: IgM, Serum: 48 mg/dL — ABNORMAL LOW (ref 50–300)

## 2018-10-16 LAB — ANTI-SMOOTH MUSCLE ANTIBODY, IGG: Actin (Smooth Muscle) Antibody (IGG): <20 U (ref <20)

## 2018-10-16 LAB — IGG: IgG (Immunoglobin G), Serum: 838 mg/dL (ref 600–1640)

## 2018-10-16 LAB — ALPHA-1-ANTITRYPSIN: A-1 Antitrypsin, Ser: 166 mg/dL (ref 83–199)

## 2018-10-16 LAB — HEPATITIS B SURFACE ANTIGEN: Hepatitis B Surface Ag: NONREACTIVE

## 2018-10-16 LAB — ANA: Anti Nuclear Antibody (ANA): NEGATIVE

## 2018-10-16 LAB — MITOCHONDRIAL ANTIBODIES: Mitochondrial M2 Ab, IgG: <=20 U

## 2018-10-16 LAB — HEPATITIS C ANTIBODY
Hepatitis C Ab: NONREACTIVE
SIGNAL TO CUT-OFF: 0.01 (ref <1.00)

## 2018-10-16 LAB — HEPATITIS B SURFACE ANTIBODY,QUALITATIVE: Hep B S Ab: NONREACTIVE

## 2018-10-16 LAB — TISSUE TRANSGLUTAMINASE, IGA: (tTG) Ab, IgA: 1 U/mL

## 2018-10-16 LAB — CERULOPLASMIN: Ceruloplasmin: 30 mg/dL (ref 18–36)

## 2018-10-16 LAB — HEPATITIS A ANTIBODY, TOTAL: Hepatitis A AB,Total: NONREACTIVE

## 2018-11-15 ENCOUNTER — Other Ambulatory Visit: Payer: Self-pay | Admitting: Nurse Practitioner

## 2018-11-15 DIAGNOSIS — E1169 Type 2 diabetes mellitus with other specified complication: Secondary | ICD-10-CM

## 2018-11-29 IMAGING — US US ABDOMEN LIMITED
1 series · 13 of 25 positions shown · non-contrast
Comparison: KUB January 21, 2017 and abdominal and pelvic CT scan
October 29, 2012

CLINICAL DATA: Elevated liver enzymes

EXAM:
ULTRASOUND ABDOMEN LIMITED RIGHT UPPER QUADRANT

[Series 1: us abdomen limited · 0.28mm/px · 13 of 46 slices shown]
[im 1/46]
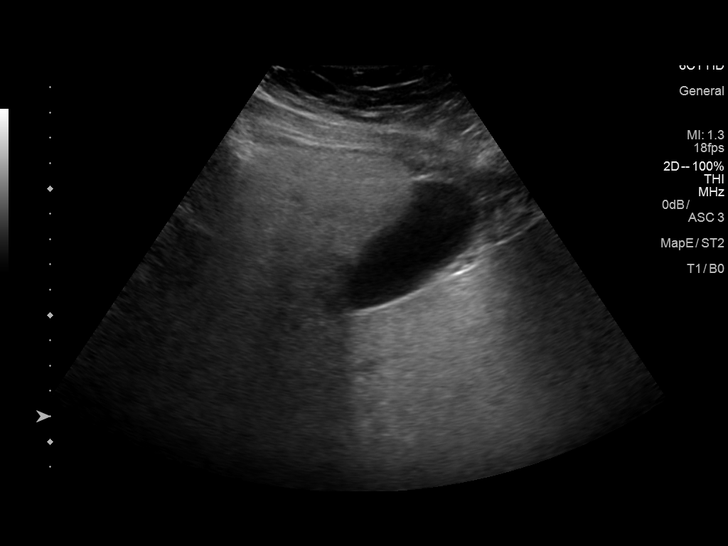
[im 4/46]
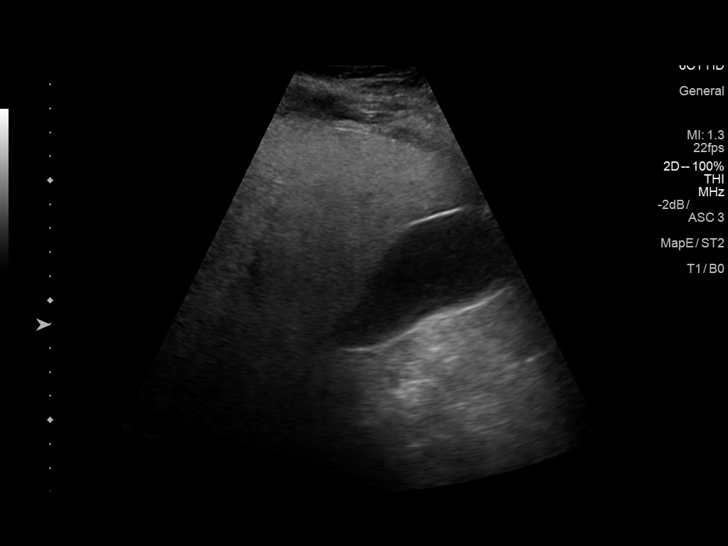
[im 8/46]
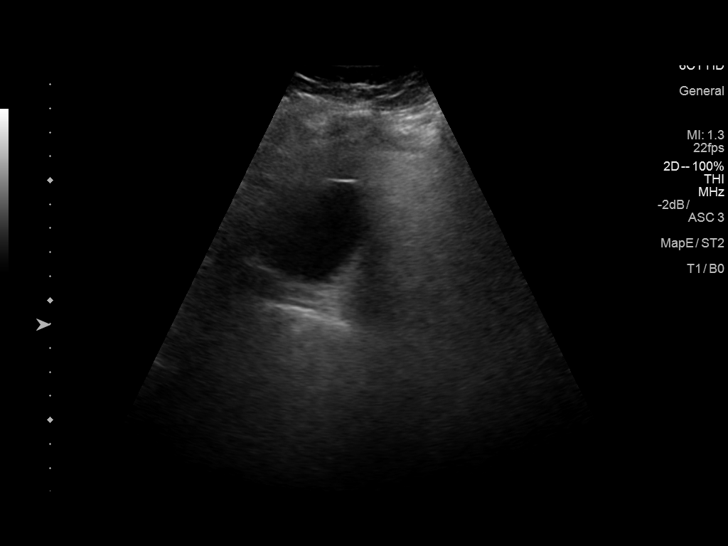
[im 12/46]
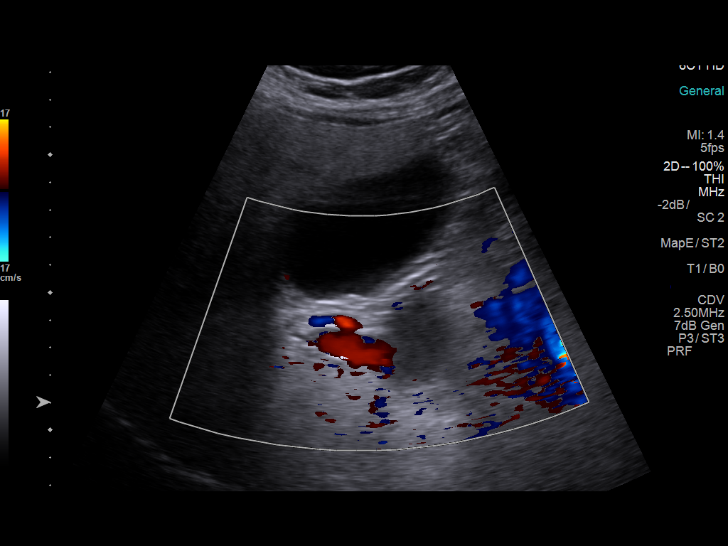
[im 16/46]
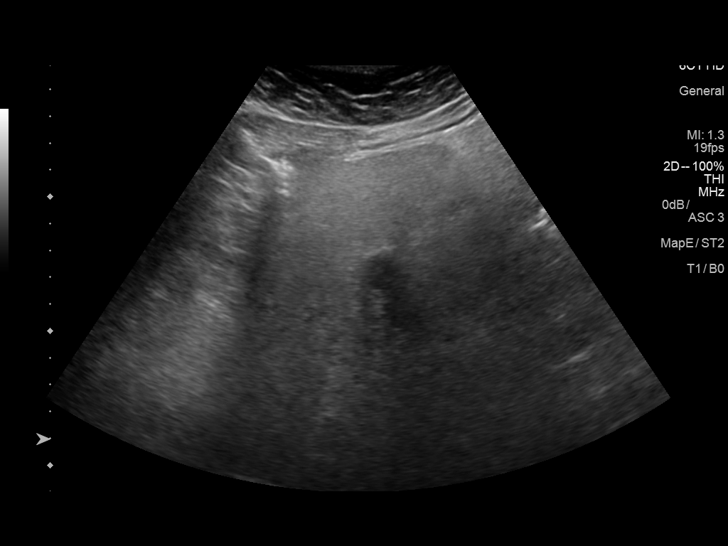
[im 19/46]
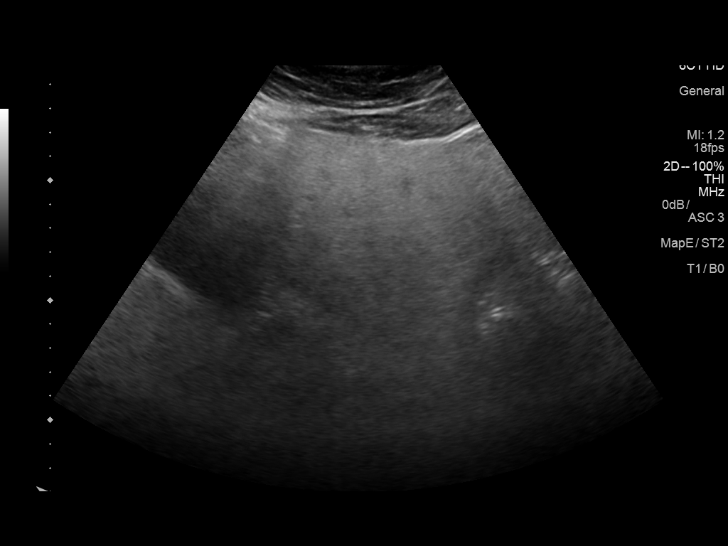
[im 23/46]
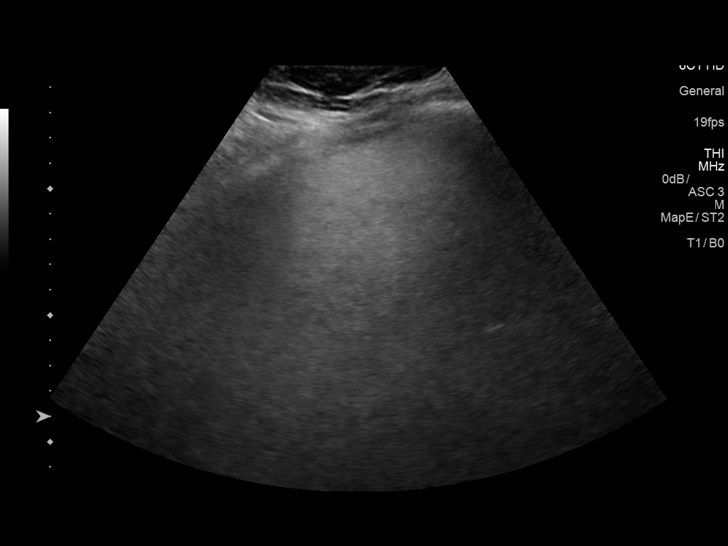
[im 27/46]
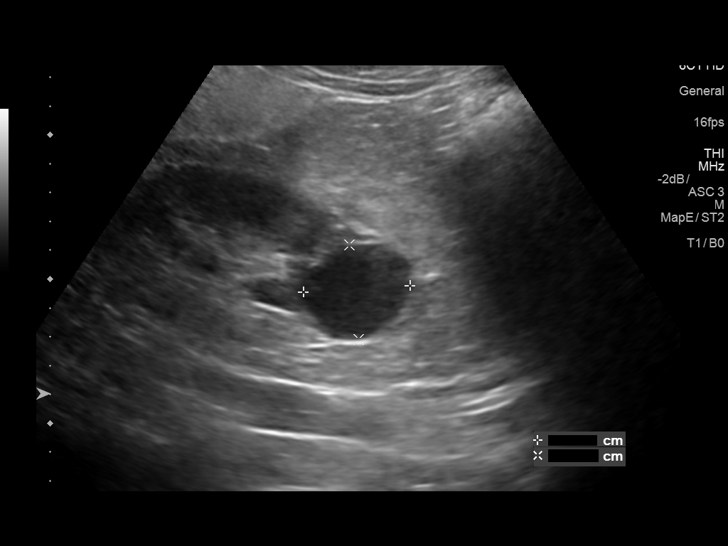
[im 31/46]
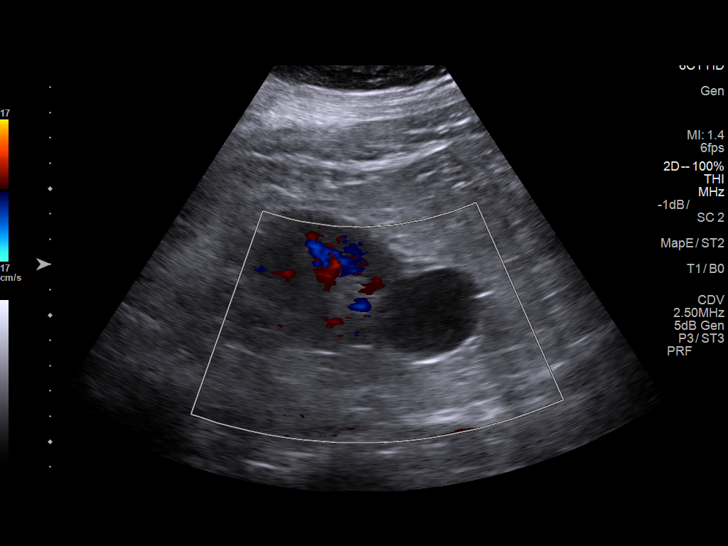
[im 34/46]
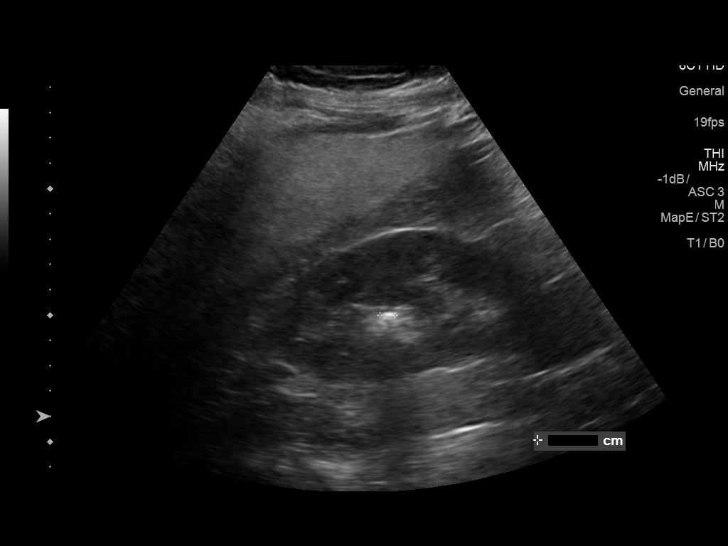
[im 38/46]
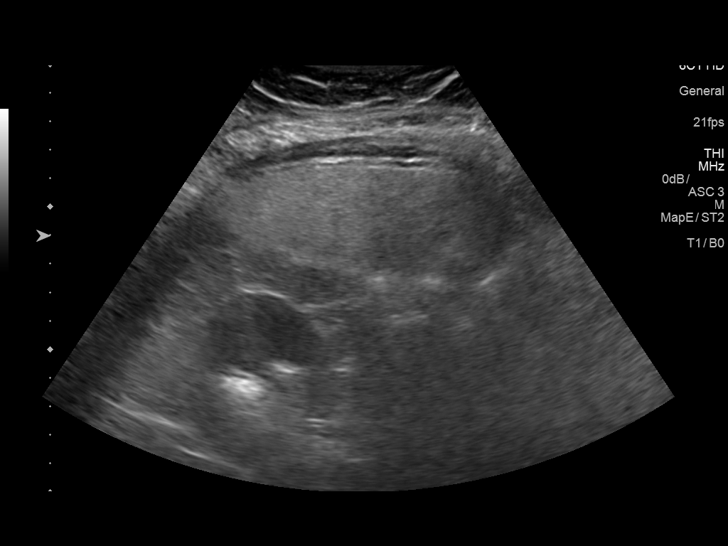
[im 42/46]
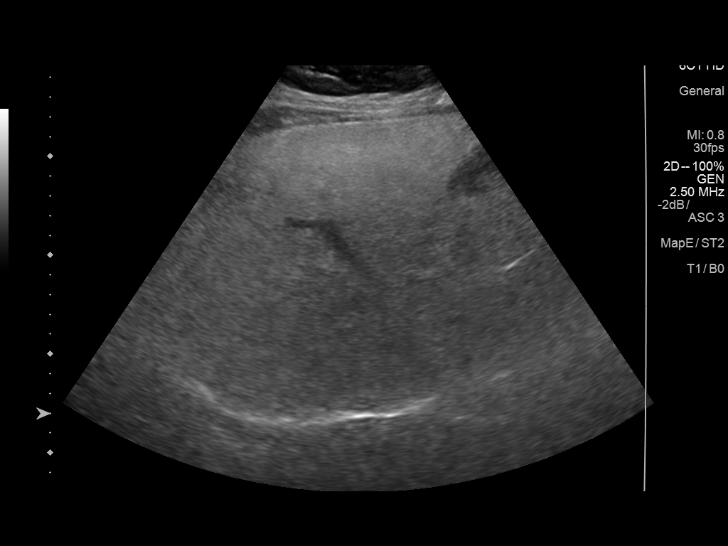
[im 46/46]
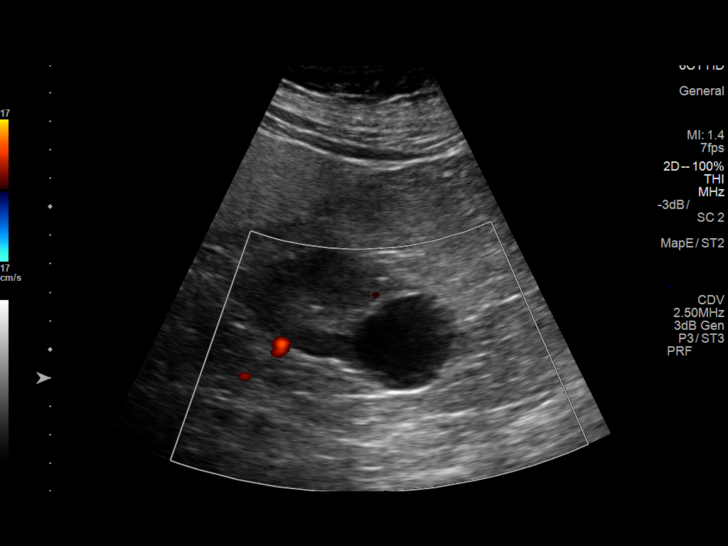

[13 of 25 positions shown; findings below may reference images not displayed]

FINDINGS: Gallbladder:

No gallstones or wall thickening visualized. No sonographic Murphy
sign noted by sonographer.

Common bile duct:

Diameter: 1.8 mm where visualized

Liver:

The hepatic echotexture is increased diffusely. The organ was
difficult to penetrate with the ultrasound beam. No discrete mass or
surface contour abnormality is observed. The hepatic vessels could
not be adequately assessed.

Incidental note is made of right renal cysts with the largest in the
lower pole measuring 3.7 x 3.0 x 3.4 cm. Internal echoes are
observed within the renal cysts.
IMPRESSION: Increased hepatic echotexture compatible with fatty infiltration.
Limited visualization of the hepatic parenchyma. The portal pain and
flow direction could not be assessed. Hepatic protocol MRI would be
useful.

Normal appearance of the gallbladder and visualized portions of the
common bile duct.

Bilateral renal cysts were observed with some difficulty. Will a
cyst in the exophytic from the lower pole of the right kidney
measures 3.7 cm in greatest dimension and exhibits some internal
echoes. A dedicated renal ultrasound could be attempted.
Alternatively evaluation of the kidneys with MRI may be the most
useful next imaging step.

## 2018-12-23 ENCOUNTER — Other Ambulatory Visit: Payer: Managed Care, Other (non HMO)

## 2018-12-28 ENCOUNTER — Ambulatory Visit: Payer: Managed Care, Other (non HMO) | Admitting: Nurse Practitioner

## 2019-01-05 ENCOUNTER — Other Ambulatory Visit: Payer: Self-pay | Admitting: *Deleted

## 2019-01-05 DIAGNOSIS — E1169 Type 2 diabetes mellitus with other specified complication: Secondary | ICD-10-CM

## 2019-01-05 DIAGNOSIS — E669 Obesity, unspecified: Secondary | ICD-10-CM

## 2019-01-05 MED ORDER — JARDIANCE 25 MG PO TABS: ORAL_TABLET | ORAL | 3 refills | Status: DC

## 2019-01-05 NOTE — Telephone Encounter (Signed)
Patient dropped off forms for patient assistance for Jardiance through [employer] [phone] Fax: [phone]  Filled out and attached Rx and medication list and placed in [employer] folder to review and sign. To be faxed to [employer].

## 2019-01-05 NOTE — Telephone Encounter (Signed)
Form Faxed. Patient notified and agreed.

## 2019-02-01 ENCOUNTER — Telehealth: Payer: Self-pay

## 2019-02-01 DIAGNOSIS — R7989 Other specified abnormal findings of blood chemistry: Secondary | ICD-10-CM

## 2019-02-01 NOTE — Telephone Encounter (Signed)
I spoke to patient to let him know that he is due in September for repeat LFT. He states that he is in between insurance companies and will have to wait a few months. The order has been placed and patient knows to come by and have lab work done as soon as he has insurance.

## 2019-03-10 ENCOUNTER — Other Ambulatory Visit: Payer: Self-pay | Admitting: Urology

## 2019-03-13 ENCOUNTER — Other Ambulatory Visit: Payer: Self-pay | Admitting: Nurse Practitioner

## 2019-03-13 DIAGNOSIS — E1169 Type 2 diabetes mellitus with other specified complication: Secondary | ICD-10-CM

## 2019-03-13 DIAGNOSIS — E669 Obesity, unspecified: Secondary | ICD-10-CM

## 2019-03-23 ENCOUNTER — Other Ambulatory Visit (HOSPITAL_COMMUNITY)
Admission: RE | Admit: 2019-03-23 | Discharge: 2019-03-23 | Disposition: A | Discharge status: Home / Self Care | Payer: Self-pay | Source: Ambulatory Visit | Attending: Urology | Admitting: Urology

## 2019-03-23 DIAGNOSIS — N201 Calculus of ureter: Secondary | ICD-10-CM | POA: Insufficient documentation

## 2019-03-23 DIAGNOSIS — Z20828 Contact with and (suspected) exposure to other viral communicable diseases: Secondary | ICD-10-CM | POA: Insufficient documentation

## 2019-03-23 DIAGNOSIS — Z01812 Encounter for preprocedural laboratory examination: Secondary | ICD-10-CM | POA: Insufficient documentation

## 2019-03-25 ENCOUNTER — Encounter (HOSPITAL_BASED_OUTPATIENT_CLINIC_OR_DEPARTMENT_OTHER): Payer: Self-pay | Admitting: *Deleted

## 2019-03-25 ENCOUNTER — Other Ambulatory Visit: Payer: Self-pay

## 2019-03-25 LAB — NOVEL CORONAVIRUS, NAA (HOSP ORDER, SEND-OUT TO REF LAB; TAT 18-24 HRS): SARS-CoV-2, NAA: NOT DETECTED

## 2019-03-25 NOTE — H&P (Signed)
HPI: Kyle Mcclure is a 58 year-old male with right ureteral stones.  01/21/17: He has remained free of any flank pain, hematuria or other symptoms to suggest the passage of the stone over the past year. He remains on Urocit-K 2 pills h.s.   07/03/17: He has been doing well over the past year. About 3 months ago he said he passed a stone but this was not associated with any flank pain. He said he did have discomfort as the stone passed through his urethra but he saw the stone pass. There was no associated hematuria. He is currently asymptomatic.   03/02/2019: In late July he began having intermittent sharp right sided renal colic radiating through the abdomen into the testicles with associated increased urinary frequency/urgency and intermittency of urine stream. His had occasional mild burning with urination. Symptoms have been intermittent and he will sometimes go a few days without having any noticeable pain/discomfort. He has not seen any visible blood in the urine or noted stone material passage. Pain moderately controlled with use of Advil and Aleve. He previously could not tolerate hydrocodone or oxycodone due to severe nausea and vomiting.   The problem is on the right side. His symptoms include flank pain, back pain, groin pain, and nausea. Patient denies having vomiting, fever, chills, and blood in urine. He first began experiencing symptoms approximately 12/29/2018. This is not his first kidney stone. He has had more than 5 stones prior to getting this one. He is currently having back pain. He denies having flank pain, groin pain, nausea, vomiting, fever, and chills. He has not caught a stone in his urine strainer since his symptoms began.   He has had ESWL and Ureteroscopy for treatment of his stones in the past. This condition would be considered of mild to moderate severity with no modifying factors or associated signs or symptoms other than as noted above.     ALLERGIES:  Hydrocodone-Acetaminophen CAPS Oxycodone-Acetaminophen CAPS    MEDICATIONS: Lisinopril  Potassium Citrate Er 10 meq (1,080 mg) tablet, extended release TAKE 2 TABLETS BY MOUTH AT BEDTIME  Atorvastatin Calcium 10 mg tablet  Jardiance  Metformin Er Osmotic 1,000 mg tablet, extended release 24 hr  Pioglitazone Hcl 15 mg tablet  Trulicity 1.5 DJ/4.9 ml pen injector 1 PO Daily     GU PSH: ESWL - 2014, 2009 Ureteroscopic stone removal - 2009       PSH Notes: Lithotripsy, Lithotripsy, Cystoscopy With Ureteroscopy With Removal Of Calculus   NON-GU PSH: No Non-GU PSH    GU PMH: Encounter for Prostate Cancer screening, He reported that his primary care provider is not performing annual rectal examinations. I did perform a DRE today and found his prostate to be smooth and benign. His PSA was normal in 2017. If he has not had PSA when he returns to see me next year I will obtain one. - 04/02/2018 Renal calculus (Stable), Right, He right renal calculi that remains stable. He did pass a stone but is asymptomatic and will therefore remain on potassium citrate and return again in a year for imaging. - 04/02/2018, (Stable), Right, His right renal calculi are stable. 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 - 2018 ED due to arterial insufficiency, Erectile dysfunction due to arterial insufficiency - 2017 Renal cyst, Renal cyst, acquired, right - 2014      PMH Notes: Right nephrolithiasis: He previously had radiolucent stones seen on the right-hand side but they are visible on KUB now. I have maintained him on Urocit-K to help prevent further  stone formation. He intermittently passed a small stones. He has undergone a previous metabolic workup which showed hypocitraturia, hyperuricosuria and hyperoxaluria.  Stone analysis: 90% calcium oxalate 10% uric acid.  CT scan 5/14 - 5 stones in his right kidney (2 in the upper pole 2 in the midpole and one in the lower pole) and no left renal calculi.  Current treatment: Urocit-K 20 mEq q.h.s. and chlorthalidone 25 mg q.a.m.   He has a known right renal cyst which is of no real significance.   Mild erectile dysfunction: He reports that he is beginning to have some early detumescence. He said his girlfriend indicates it's not a problem but he has noted some difficulty in this area.  Current therapy: Sildenafil     NON-GU PMH: Encounter for general adult medical examination without abnormal findings, Encounter for preventive health examination - 2015 Personal history of other diseases of the circulatory system, History of hypertension - 2014    FAMILY HISTORY: Family Health Status Number - Runs In Family No pertinent family history - Other   SOCIAL HISTORY: Marital Status: Single Preferred Language: English; Ethnicity: Not Hispanic Or Latino; Race: White Current Smoking Status: Patient does not smoke anymore. Has not smoked since 01/02/1992.   Tobacco Use Assessment Completed: Used Tobacco in last 30 days? Drinks 2 drinks per week.  Drinks 1 caffeinated drink per day.     Notes: Former smoker, Tobacco Use, Marital History - Divorced, Alcohol Use, Occupation:, Caffeine Use   REVIEW OF SYSTEMS:    GU Review Male:   Patient reports frequent urination, hard to postpone urination, and trouble starting your stream. Patient denies burning/ pain with urination, get up at night to urinate, leakage of urine, stream starts and stops, have to strain to urinate , erection problems, and penile pain.  Gastrointestinal (Upper):   Patient reports nausea. Patient denies indigestion/ heartburn and vomiting.   Gastrointestinal (Lower):   Patient denies diarrhea and constipation.  Constitutional:   Patient denies fever, night sweats, weight loss, and fatigue.  Skin:   Patient denies skin rash/ lesion and itching.  Eyes:   Patient denies blurred vision and double vision.  Ears/ Nose/ Throat:   Patient denies sore throat and sinus problems.  Hematologic/Lymphatic:   Patient denies swollen glands and easy bruising.  Cardiovascular:   Patient denies leg swelling and chest pains.  Respiratory:   Patient denies cough and shortness of breath.  Endocrine:   Patient denies excessive thirst.  Musculoskeletal:   Patient reports back pain. Patient denies joint pain.  Neurological:   Patient denies headaches and dizziness.  Psychologic:   Patient denies depression and anxiety.   VITAL SIGNS:    Weight 245 lb / 111.13 kg  Height 68 in / 172.72 cm  BP 113/76 mmHg  Pulse 111 /min  Temperature 97.1 F / 36.1 C  BMI 37.2 kg/m   MULTI-SYSTEM PHYSICAL EXAMINATION:    Constitutional: Well-nourished. No physical deformities. Normally developed. Good grooming.  Neck: Neck symmetrical, not swollen. Normal tracheal position.  Respiratory: No labored breathing, no use  of accessory muscles.   Cardiovascular: Normal temperature, normal extremity pulses, no swelling, no varicosities.  Neurologic / Psychiatric: Oriented to time, oriented to place, oriented to person. No depression, no anxiety, no agitation.  Gastrointestinal: Obese abdomen. No mass, no tenderness, no rigidity. NO CVA, flank or lower abdominal tenderness.  Musculoskeletal: Normal gait and station of head and neck.     PAST DATA REVIEWED:  Source Of History:  Patient, Medical Record Summary  Records Review:   Previous Patient Records  Urine Test Review:   Urinalysis  X-Ray Review: KUB: Reviewed Films. Discussed With Patient.     09/13/15  PSA  Total PSA 0.2 ng/dl    65/78/4609/29/20  Urinalysis  Urine Appearance Clear   Urine Color Yellow   Urine  Glucose 3+ mg/dL  Urine Bilirubin Neg mg/dL  Urine Ketones Neg mg/dL  Urine Specific Gravity 1.020   Urine Blood Neg ery/uL  Urine pH <=5.0   Urine Protein Neg mg/dL  Urine Urobilinogen 0.2 mg/dL  Urine Nitrites Neg   Urine Leukocyte Esterase Neg leu/uL   PROCEDURES:         KUB - [id number]  A single view of the abdomen is obtained. No obvious left-sided renal calculi noted on today's exam. The 2 previously noted right renal calculi and no longer noted within the confines of the right renal shadow. Along the anatomical expected course of the right distal ureter to approximately 7.5 mm to 7.6 mm ovoid shaped opacities consistent with the previously identified right renal calculi are noted overlying the right sacral wing approximately 1 cm apart from one another. Bowel gas pattern appears within normal limits. Stable pelvic phlebolith noted. Bladder grossly appears free of obstruction.         Urinalysis Dipstick Dipstick Cont'd  Color: Yellow Bilirubin: Neg mg/dL  Appearance: Clear Ketones: Neg mg/dL  Specific Gravity: 2.8411.020 Blood: Neg ery/uL  pH: <=5.0 Protein: Neg mg/dL  Glucose: 3+ mg/dL Urobilinogen: 0.2 mg/dL    Nitrites: Neg    Leukocyte Esterase: Neg leu/uL    ASSESSMENT/PLAN:      ICD-10 Details  1 GU:   Flank Pain - R10.84   2   Ureteral calculus - N20.1 Right   There are 2 approximately 7.5 mm calculi separated by 1 cm noted along the distal course of the right ureter which grossly counts were patient's continued symptoms. I recommended ureteroscopy. The patient has had this procedure in the past for stone treatment.  For ureteroscopy I described the risks which include heart attack, stroke, pulmonary embolus, death, bleeding, infection, damage to contiguous structures, positioning injury, ureteral stricture, ureteral avulsion, ureteral injury, need for ureteral stent, inability to perform ureteroscopy, need for an interval procedure, inability to clear stone burden, stent  discomfort and pain.

## 2019-03-25 NOTE — Anesthesia Preprocedure Evaluation (Addendum)
Anesthesia Evaluation  Patient identified by MRN, date of birth, ID band Patient awake    Reviewed: Allergy & Precautions, NPO status , Patient's Chart, lab work & pertinent test results  Airway Mallampati: II  TM Distance: >3 FB Neck ROM: Full    Dental no notable dental hx. (+) Dental Advisory Given, Teeth Intact   Pulmonary former smoker,    Pulmonary exam normal breath sounds clear to auscultation       Cardiovascular hypertension, Pt. on medications Normal cardiovascular exam Rhythm:Regular Rate:Normal     Neuro/Psych negative neurological ROS  negative psych ROS   GI/Hepatic negative GI ROS, Neg liver ROS,   Endo/Other  diabetes, Type 2  Renal/GU negative Renal ROS     Musculoskeletal negative musculoskeletal ROS (+)   Abdominal (+) + obese,   Peds  Hematology negative hematology ROS (+)   Anesthesia Other Findings   Reproductive/Obstetrics                            Anesthesia Physical Anesthesia Plan  ASA: II  Anesthesia Plan: General   Post-op Pain Management:    Induction: Intravenous  PONV Risk Score and Plan: 4 or greater and Ondansetron, Dexamethasone, Midazolam and Treatment may vary due to age or medical condition  Airway Management Planned: LMA  Additional Equipment: None  Intra-op Plan:   Post-operative Plan: Extubation in OR  Informed Consent: I have reviewed the patients History and Physical, chart, labs and discussed the procedure including the risks, benefits and alternatives for the proposed anesthesia with the patient or authorized representative who has indicated his/her understanding and acceptance.     Dental advisory given  Plan Discussed with: CRNA  Anesthesia Plan Comments:        Anesthesia Quick Evaluation

## 2019-03-25 NOTE — Progress Notes (Signed)
Spoke w/ via phone for pre-op interview--- PT Lab needs dos----   KUB, Istat 8, EKG COVID test ------ 03-23-2019  Arrive at ------- 0530 NPO after ------ MN Medications to take morning of surgery ----- NONE Diabetic medication ----- do not take any diabetic medication morning of surgery Patient Special Instructions ----- n/a Pre-Op special Istructions ----- n/a  Patient verbalized understanding of instructions that were given at this phone interview. Patient denies cardiac s&s, shortness of breath, chest pain, fever, cough a this phone interview.   Anesthesia:  PCP:  Sherrie Mustache NP Cardiologist : no Chest x-ray : no EKG : 01-20-2017  epic Echo : no Stress test:   Per pt had one yrs and yrs ago was told ok Cardiac Cath :  no Sleep Study/ CPAP : NO Fasting Blood Sugar :   140   / Checks Blood Sugar -- times a day:   Per pt has not check for past few weeks Blood Thinner/ Instructions /Last Dose: no ASA / Instructions/ Last Dose :  ASA 81 mg/  Given instructions to stop by dr Karsten Ro office/  Last dose 03-19-2019

## 2019-03-26 ENCOUNTER — Ambulatory Visit (HOSPITAL_BASED_OUTPATIENT_CLINIC_OR_DEPARTMENT_OTHER)
Admission: RE | Admit: 2019-03-26 | Discharge: 2019-03-26 | Disposition: A | Discharge status: Home / Self Care | Payer: Self-pay | Attending: Urology | Admitting: Urology

## 2019-03-26 ENCOUNTER — Encounter (HOSPITAL_BASED_OUTPATIENT_CLINIC_OR_DEPARTMENT_OTHER): Payer: Self-pay

## 2019-03-26 ENCOUNTER — Ambulatory Visit (HOSPITAL_BASED_OUTPATIENT_CLINIC_OR_DEPARTMENT_OTHER): Payer: Self-pay | Admitting: Anesthesiology

## 2019-03-26 ENCOUNTER — Encounter (HOSPITAL_BASED_OUTPATIENT_CLINIC_OR_DEPARTMENT_OTHER)
Admission: RE | Disposition: A | Discharge status: Home / Self Care | Payer: Self-pay | Source: Home / Self Care | Attending: Urology

## 2019-03-26 ENCOUNTER — Ambulatory Visit (HOSPITAL_COMMUNITY): Payer: Self-pay

## 2019-03-26 DIAGNOSIS — E119 Type 2 diabetes mellitus without complications: Secondary | ICD-10-CM | POA: Insufficient documentation

## 2019-03-26 DIAGNOSIS — Z7984 Long term (current) use of oral hypoglycemic drugs: Secondary | ICD-10-CM | POA: Insufficient documentation

## 2019-03-26 DIAGNOSIS — E669 Obesity, unspecified: Secondary | ICD-10-CM | POA: Insufficient documentation

## 2019-03-26 DIAGNOSIS — Z79899 Other long term (current) drug therapy: Secondary | ICD-10-CM | POA: Insufficient documentation

## 2019-03-26 DIAGNOSIS — E785 Hyperlipidemia, unspecified: Secondary | ICD-10-CM | POA: Insufficient documentation

## 2019-03-26 DIAGNOSIS — N201 Calculus of ureter: Secondary | ICD-10-CM | POA: Insufficient documentation

## 2019-03-26 DIAGNOSIS — K760 Fatty (change of) liver, not elsewhere classified: Secondary | ICD-10-CM | POA: Insufficient documentation

## 2019-03-26 DIAGNOSIS — Z6834 Body mass index (BMI) 34.0-34.9, adult: Secondary | ICD-10-CM | POA: Insufficient documentation

## 2019-03-26 DIAGNOSIS — N401 Enlarged prostate with lower urinary tract symptoms: Secondary | ICD-10-CM | POA: Insufficient documentation

## 2019-03-26 DIAGNOSIS — Z87891 Personal history of nicotine dependence: Secondary | ICD-10-CM | POA: Insufficient documentation

## 2019-03-26 DIAGNOSIS — Z885 Allergy status to narcotic agent status: Secondary | ICD-10-CM | POA: Insufficient documentation

## 2019-03-26 DIAGNOSIS — Z87442 Personal history of urinary calculi: Secondary | ICD-10-CM | POA: Insufficient documentation

## 2019-03-26 DIAGNOSIS — I10 Essential (primary) hypertension: Secondary | ICD-10-CM | POA: Insufficient documentation

## 2019-03-26 HISTORY — DX: Calculus of ureter: N20.1

## 2019-03-26 HISTORY — PX: CYSTOSCOPY/RETROGRADE/URETEROSCOPY/STONE EXTRACTION WITH BASKET: SHX5317

## 2019-03-26 HISTORY — DX: Type 2 diabetes mellitus without complications: E11.9

## 2019-03-26 HISTORY — DX: Fatty (change of) liver, not elsewhere classified: K76.0

## 2019-03-26 HISTORY — DX: Presence of spectacles and contact lenses: Z97.3

## 2019-03-26 LAB — POCT I-STAT, CHEM 8
BUN: 24 mg/dL — ABNORMAL HIGH (ref 6–20)
Calcium, Ion: 1.28 mmol/L (ref 1.15–1.40)
Chloride: 99 mmol/L (ref 98–111)
Creatinine, Ser: 1.2 mg/dL (ref 0.61–1.24)
Glucose, Bld: 159 mg/dL — ABNORMAL HIGH (ref 70–99)
HCT: 49 % (ref 39.0–52.0)
Hemoglobin: 16.7 g/dL (ref 13.0–17.0)
Potassium: 4.2 mmol/L (ref 3.5–5.1)
Sodium: 135 mmol/L (ref 135–145)
TCO2: 22 mmol/L (ref 22–32)

## 2019-03-26 LAB — GLUCOSE, CAPILLARY: Glucose-Capillary: 152 mg/dL — ABNORMAL HIGH (ref 70–99)

## 2019-03-26 SURGERY — CYSTOSCOPY, WITH CALCULUS REMOVAL USING BASKET
Anesthesia: General | Site: Renal | Laterality: Right

## 2019-03-26 MED ORDER — ONDANSETRON HCL 4 MG/2ML IJ SOLN
INTRAMUSCULAR | Status: AC
  Filled 2019-03-26: qty 2

## 2019-03-26 MED ORDER — CEFAZOLIN SODIUM-DEXTROSE 2-4 GM/100ML-% IV SOLN
2.0000 g | Freq: Once | INTRAVENOUS | Status: AC
  Administered 2019-03-26: 07:00:00 2 g via INTRAVENOUS
  Filled 2019-03-26: qty 100

## 2019-03-26 MED ORDER — KETOROLAC TROMETHAMINE 15 MG/ML IJ SOLN
15.0000 mg | Freq: Once | INTRAMUSCULAR | Status: AC
  Administered 2019-03-26: 15 mg via INTRAVENOUS
  Filled 2019-03-26: qty 1

## 2019-03-26 MED ORDER — SODIUM CHLORIDE 0.9 % IR SOLN
Status: DC | PRN
  Administered 2019-03-26 (×2): 3000 mL

## 2019-03-26 MED ORDER — PHENAZOPYRIDINE HCL 200 MG PO TABS
200.0000 mg | ORAL_TABLET | Freq: Once | ORAL | Status: AC
  Administered 2019-03-26: 200 mg via ORAL
  Filled 2019-03-26: qty 1

## 2019-03-26 MED ORDER — PHENAZOPYRIDINE HCL 100 MG PO TABS
ORAL_TABLET | ORAL | Status: AC
  Filled 2019-03-26: qty 2

## 2019-03-26 MED ORDER — TRAMADOL HCL 50 MG PO TABS: 50.0000 mg | ORAL_TABLET | Freq: Four times a day (QID) | ORAL | 0 refills | Status: DC | PRN

## 2019-03-26 MED ORDER — PROMETHAZINE HCL 25 MG/ML IJ SOLN
6.2500 mg | INTRAMUSCULAR | Status: DC | PRN
  Filled 2019-03-26: qty 1

## 2019-03-26 MED ORDER — PHENAZOPYRIDINE HCL 200 MG PO TABS: 200.0000 mg | ORAL_TABLET | Freq: Three times a day (TID) | ORAL | 0 refills | Status: DC | PRN

## 2019-03-26 MED ORDER — DIPHENHYDRAMINE HCL 50 MG/ML IJ SOLN
INTRAMUSCULAR | Status: DC | PRN
  Administered 2019-03-26: 25 mg via INTRAVENOUS

## 2019-03-26 MED ORDER — CEFAZOLIN SODIUM-DEXTROSE 2-4 GM/100ML-% IV SOLN
INTRAVENOUS | Status: AC
  Filled 2019-03-26: qty 100

## 2019-03-26 MED ORDER — ACETAMINOPHEN 10 MG/ML IV SOLN
1000.0000 mg | Freq: Once | INTRAVENOUS | Status: AC
  Administered 2019-03-26: 1000 mg via INTRAVENOUS
  Filled 2019-03-26: qty 100

## 2019-03-26 MED ORDER — FENTANYL CITRATE (PF) 100 MCG/2ML IJ SOLN
25.0000 ug | INTRAMUSCULAR | Status: DC | PRN
  Filled 2019-03-26: qty 1

## 2019-03-26 MED ORDER — PROPOFOL 10 MG/ML IV BOLUS
INTRAVENOUS | Status: DC | PRN
  Administered 2019-03-26: 180 mg via INTRAVENOUS

## 2019-03-26 MED ORDER — MIDAZOLAM HCL 2 MG/2ML IJ SOLN
INTRAMUSCULAR | Status: AC
  Filled 2019-03-26: qty 2

## 2019-03-26 MED ORDER — FENTANYL CITRATE (PF) 100 MCG/2ML IJ SOLN
INTRAMUSCULAR | Status: DC | PRN
  Administered 2019-03-26 (×2): 50 ug via INTRAVENOUS

## 2019-03-26 MED ORDER — DIPHENHYDRAMINE HCL 50 MG/ML IJ SOLN
INTRAMUSCULAR | Status: AC
  Filled 2019-03-26: qty 1

## 2019-03-26 MED ORDER — LIDOCAINE 2% (20 MG/ML) 5 ML SYRINGE
INTRAMUSCULAR | Status: AC
  Filled 2019-03-26: qty 5

## 2019-03-26 MED ORDER — DEXAMETHASONE SODIUM PHOSPHATE 4 MG/ML IJ SOLN
INTRAMUSCULAR | Status: DC | PRN
  Administered 2019-03-26: 5 mg via INTRAVENOUS

## 2019-03-26 MED ORDER — LACTATED RINGERS IV SOLN
INTRAVENOUS | Status: DC
  Administered 2019-03-26 (×2): via INTRAVENOUS
  Filled 2019-03-26: qty 1000

## 2019-03-26 MED ORDER — ACETAMINOPHEN 10 MG/ML IV SOLN
INTRAVENOUS | Status: AC
  Filled 2019-03-26: qty 100

## 2019-03-26 MED ORDER — FENTANYL CITRATE (PF) 100 MCG/2ML IJ SOLN
INTRAMUSCULAR | Status: AC
  Filled 2019-03-26: qty 2

## 2019-03-26 MED ORDER — ONDANSETRON HCL 4 MG/2ML IJ SOLN
INTRAMUSCULAR | Status: DC | PRN
  Administered 2019-03-26: 4 mg via INTRAVENOUS

## 2019-03-26 MED ORDER — MEPERIDINE HCL 25 MG/ML IJ SOLN
6.2500 mg | INTRAMUSCULAR | Status: DC | PRN
  Filled 2019-03-26: qty 1

## 2019-03-26 MED ORDER — KETOROLAC TROMETHAMINE 30 MG/ML IJ SOLN
INTRAMUSCULAR | Status: AC
  Filled 2019-03-26: qty 1

## 2019-03-26 MED ORDER — IOHEXOL 300 MG/ML  SOLN
INTRAMUSCULAR | Status: DC | PRN
  Administered 2019-03-26: 08:00:00 10 mL

## 2019-03-26 MED ORDER — LIDOCAINE HCL (CARDIAC) PF 100 MG/5ML IV SOSY
PREFILLED_SYRINGE | INTRAVENOUS | Status: DC | PRN
  Administered 2019-03-26: 80 mg via INTRAVENOUS

## 2019-03-26 MED ORDER — PROPOFOL 10 MG/ML IV BOLUS
INTRAVENOUS | Status: AC
  Filled 2019-03-26: qty 20

## 2019-03-26 MED ORDER — MIDAZOLAM HCL 5 MG/5ML IJ SOLN
INTRAMUSCULAR | Status: DC | PRN
  Administered 2019-03-26: 2 mg via INTRAVENOUS

## 2019-03-26 MED ORDER — DEXAMETHASONE SODIUM PHOSPHATE 10 MG/ML IJ SOLN
INTRAMUSCULAR | Status: AC
  Filled 2019-03-26: qty 1

## 2019-03-26 MED ORDER — ONDANSETRON HCL 4 MG/2ML IJ SOLN
4.0000 mg | Freq: Once | INTRAMUSCULAR | Status: AC
  Administered 2019-03-26: 4 mg via INTRAVENOUS
  Filled 2019-03-26: qty 2

## 2019-03-26 SURGICAL SUPPLY — 32 items
BAG DRAIN URO-CYSTO SKYTR STRL (DRAIN) ×2 IMPLANT
BASKET ZERO TIP NITINOL 2.4FR (BASKET) ×1 IMPLANT
BSKT STON RTRVL ZERO TP 2.4FR (BASKET) ×1
CATH INTERMIT  6FR 70CM (CATHETERS) IMPLANT
CATH URET 5FR 28IN CONE TIP (BALLOONS)
CATH URET 5FR 70CM CONE TIP (BALLOONS) IMPLANT
CLOTH BEACON ORANGE TIMEOUT ST (SAFETY) ×2 IMPLANT
EXTRACTOR STONE 1.7FRX115CM (UROLOGICAL SUPPLIES) IMPLANT
FIBER LASER FLEXIVA 365 (UROLOGICAL SUPPLIES) ×1 IMPLANT
FIBER LASER TRAC TIP (UROLOGICAL SUPPLIES) IMPLANT
GLOVE BIO SURGEON STRL SZ7 (GLOVE) ×1 IMPLANT
GLOVE BIO SURGEON STRL SZ8 (GLOVE) ×2 IMPLANT
GLOVE BIOGEL PI IND STRL 6.5 (GLOVE) IMPLANT
GLOVE BIOGEL PI IND STRL 7.5 (GLOVE) IMPLANT
GLOVE BIOGEL PI INDICATOR 6.5 (GLOVE) ×1
GLOVE BIOGEL PI INDICATOR 7.5 (GLOVE) ×2
GOWN STRL REUS W/ TWL LRG LVL3 (GOWN DISPOSABLE) IMPLANT
GOWN STRL REUS W/TWL LRG LVL3 (GOWN DISPOSABLE) ×4
GOWN STRL REUS W/TWL XL LVL3 (GOWN DISPOSABLE) ×2 IMPLANT
GUIDEWIRE ANG ZIPWIRE 038X150 (WIRE) IMPLANT
GUIDEWIRE STR DUAL SENSOR (WIRE) ×3 IMPLANT
IV NS IRRIG 3000ML ARTHROMATIC (IV SOLUTION) ×4 IMPLANT
KIT TURNOVER CYSTO (KITS) ×2 IMPLANT
MANIFOLD NEPTUNE II (INSTRUMENTS) ×1 IMPLANT
NS IRRIG 500ML POUR BTL (IV SOLUTION) ×2 IMPLANT
PACK CYSTO (CUSTOM PROCEDURE TRAY) ×2 IMPLANT
SHEATH URET ACCESS 12FR/35CM (UROLOGICAL SUPPLIES) ×1 IMPLANT
STENT POLARIS LOOP 6FR X 24 CM (STENTS) ×1 IMPLANT
SYRINGE IRR TOOMEY STRL 70CC (SYRINGE) ×1 IMPLANT
TUBE CONNECTING 12X1/4 (SUCTIONS) ×1 IMPLANT
TUBING UROLOGY SET (TUBING) ×1 IMPLANT
WATER STERILE IRR 3000ML UROMA (IV SOLUTION) ×1 IMPLANT

## 2019-03-26 NOTE — Anesthesia Postprocedure Evaluation (Signed)
Anesthesia Post Note  Patient: Kyle Mcclure  Procedure(s) Performed: CYSTOSCOPY/RETROGRADE/URETEROSCOPY/STONE EXTRACTION WITH BASKET/ STENT PLACEMENT (Right Renal)     Patient location during evaluation: PACU Anesthesia Type: General Level of consciousness: sedated and patient cooperative Pain management: pain level controlled Vital Signs Assessment: post-procedure vital signs reviewed and stable Respiratory status: spontaneous breathing Cardiovascular status: stable Anesthetic complications: no    Last Vitals:  Vitals:   03/26/19 0930 03/26/19 1030  BP:  (!) 135/91  Pulse:  76  Resp:  18  Temp:  37.2 C  SpO2: 93% 92%    Last Pain:  Vitals:   03/26/19 1030  TempSrc:   PainSc: [place]

## 2019-03-26 NOTE — Discharge Instructions (Signed)
Post stone removal/stent placement surgery instructions   Definitions:  Ureter: The duct that transports urine from the kidney to the bladder. Stent: A plastic hollow tube that is placed into the ureter, from the kidney to the bladder to prevent the ureter from swelling shut.  General instructions:  Despite the fact that no skin incisions were used, the area around the ureter and bladder is raw and irritated. The stent is a foreign body which will further irritate the bladder wall. This irritation is manifested by increased frequency of urination, both day and night, and by an increase in the urge to urinate. In some, the urge to urinate is present almost always. Sometimes the urge is strong enough that you may not be able to stop your self from urinating. The only real cure is to remove the stent and then give time for the bladder wall to heal which can't be done until the danger of the ureter swelling shut has passed. (This varies from 2-21 days).  You may see some blood in your urine while the stent is in place and a few days afterward. Do not be alarmed, even if the urine is clear for a while. Get off your feet and drink lots of fluids until clearing occurs. If you start to pass clots or don't improve, call us.  If you have a string coming from your urethra:  The stent string is attached to your ureteral stent.  Do not pull on thisIf you have a string coming from your urethra:  The stent string is attached to your ureteral stent.  Do not pull on this.  Diet:  You may return to your normal diet immediately. Because of the raw surface of your bladder, alcohol, spicy foods, foods high in acid and drinks with caffeine may cause irritation or frequency and should be used in moderation. To keep your urine flowing freely and avoid constipation, drink plenty of fluids during the day (8-10 glasses). Tip: Avoid cranberry juice because it is very acidic.  Activity:  Your physical activity doesn't need  to be restricted. However, if you are very active, you may see some blood in the urine. We suggest that you reduce your activity under the circumstances until the bleeding has stopped.  Bowels:  It is important to keep your bowels regular during the postoperative period. Straining with bowel movements can cause bleeding. A bowel movement every other day is reasonable. Use a mild laxative if needed, such as milk of magnesia 2-3 tablespoons, or 2 Dulcolax tablets. Call if you continue to have problems. If you had been taking narcotics for pain, before, during or after your surgery, you may be constipated. Take a laxative if necessary.     Medication:  You should resume your pre-surgery medications unless told not to. DO NOT RESUME YOUR ASPIRIN, or any other medicines like ibuprofen, motrin, excedrin, advil, aleve, vitamin E, fish oil as these can all cause bleeding x 7 days. In addition you may be given an antibiotic to prevent or treat infection. Antibiotics are not always necessary. All medication should be taken as prescribed until the bottles are finished unless you are having an unusual reaction to one of the drugs.  Problems you should report to [place]:  a. Fever greater than 101F. b. Heavy bleeding, or clots (see notes above about blood in urine). c. Inability to urinate. d. Drug reactions (hives, rash, nausea, vomiting, diarrhea). e. Severe burning or pain with urination that is not improving.  Followup:  You will need a followup appointment to monitor your progress in most cases. Please call the office for this appointment when you get home if your appointment has not already been scheduled. Usually the first appointment will be about 5-14 days after your surgery and if you have a stent in place it will likely be removed at that time.   CYSTOSCOPY HOME CARE INSTRUCTIONS  Activity: Rest for the remainder of the day.  Do not drive or operate equipment today.  You may resume normal  activities in one to two days as instructed by your physician.   Meals: Drink plenty of liquids and eat light foods such as gelatin or soup this evening.  You may return to a normal meal plan tomorrow.  Return to Work: You may return to work in one to two days or as instructed by your physician.  Special Instructions / Symptoms: Call your physician if any of these symptoms occur:   -persistent or heavy bleeding  -bleeding which continues after first few urination  -large blood clots that are difficult to pass  -urine stream diminishes or stops completely  -fever equal to or higher than 101 degrees Farenheit.  -cloudy urine with a strong, foul odor  -severe pain  Females should always wipe from front to back after elimination.  You may feel some burning pain when you urinate.  This should disappear with time.  Applying moist heat to the lower abdomen or a hot tub bath may help relieve the pai  Call for an appointment to arrange follow-up. As instructed  Patient Signature:  ________________________________________________________  Nurse's Signature:  ________________________________________________________  Post Anesthesia Home Care Instructions  Activity: Get plenty of rest for the remainder of the day. A responsible individual must stay with you for 24 hours following the procedure.  For the next 24 hours, DO NOT: -Drive a car -Paediatric nurse -Drink alcoholic beverages -Take any medication unless instructed by your physician -Make any legal decisions or sign important papers.  Meals: Start with liquid foods such as gelatin or soup. Progress to regular foods as tolerated. Avoid greasy, spicy, heavy foods. If nausea and/or vomiting occur, drink only clear liquids until the nausea and/or vomiting subsides. Call your physician if vomiting continues.  Special Instructions/Symptoms: Your throat may feel dry or sore from the anesthesia or the breathing tube placed in your throat  during surgery. If this causes discomfort, gargle with warm salt water. The discomfort should disappear within 24 hours.  If you had a scopolamine patch placed behind your ear for the management of post- operative nausea and/or vomiting:  1. The medication in the patch is effective for 72 hours, after which it should be removed.  Wrap patch in a tissue and discard in the trash. Wash hands thoroughly with soap and water. 2. You may remove the patch earlier than 72 hours if you experience unpleasant side effects which may include dry mouth, dizziness or visual disturbances. 3. Avoid touching the patch. Wash your hands with soap and water after contact with the patch.

## 2019-03-26 NOTE — Op Note (Signed)
PATIENT:  Kyle Mcclure  PRE-OPERATIVE DIAGNOSIS:  right Ureteral calculi  POST-OPERATIVE DIAGNOSIS: Same  PROCEDURE:  1. Cystoscopy with right retrograde pyelogram including interpretation 2. Right ureteroscopy, laser lithotripsy, basket retrieval of stone fragments and stent placement 3. Fluoroscopy time less than 1 hour  SURGEON: Claybon Jabs, MD  INDICATION: Kyle Mcclure is a 58 year old male with a history of calculus disease.  He began having intermittent sharp right-sided renal colic back in 1/44.  Last month he was seen in the office and a KUB revealed 2 large stones measuring 7.5 and 7.6 cm in his distal right ureter.  He is therefore brought to the operating room for ureteroscopic management of his stones.  ANESTHESIA:  General  EBL:  Minimal  DRAINS: 6 French, 24 cm Polaris stent in the right ureter (with string)  SPECIMEN: Stone given to patient  DESCRIPTION OF PROCEDURE: The patient was taken to the major OR and placed on the table. General anesthesia was administered and then the patient was moved to the dorsal lithotomy position. The genitalia was sterilely prepped and draped. An official timeout was performed.  Initially the 5 French cystoscope with 30 lens was passed under direct vision into the bladder. The bladder was then fully inspected. It was noted be free of any tumors, stones or inflammatory lesions. Ureteral orifices were of normal configuration and position. A 6 French open-ended ureteral catheter was then passed through the cystoscope into the ureteral orifice in order to perform a right retrograde pyelogram.  A retrograde pyelogram was performed by injecting full-strength contrast up the right ureter under direct fluoroscopic control. It revealed 2 filling defects in the distal right ureter consistent with the stone seen on the preoperative KUB. The remainder of the ureter was noted to be normal as was the intrarenal collecting system. I then passed a 0.038  inch floppy-tipped guidewire through the open ended catheter and into the area of the renal pelvis and this was left in place. The inner portion of a ureteral access sheath was then passed over the guidewire to gently dilate the intramural ureter. I then proceeded with ureteroscopy.  A 6 French dual-lumen, rigid ureteroscope was then passed under direct into the bladder and into the right orifice and up the ureter. The first stone was identified and I felt it was too large to extract and therefore elected to proceed with laser lithotripsy. The 365  holmium laser fiber was used to fragment the stone. I then used the nitinol basket to extract all of the stone fragments I then fragmented the second stone and again used the basket to retrieve all stone fragments. Reinspection of the ureter ureteroscopically revealed no further stone fragments and no injury to the ureter. I then backloaded the cystoscope over the guidewire and passed the stent over the guidewire into the area of the renal pelvis. As the guidewire was removed good curl was noted in the renal pelvis. The bladder was drained and the cystoscope was then removed. The patient tolerated the procedure well no intraoperative complications.  PLAN OF CARE: Discharge to home after PACU  PATIENT DISPOSITION:  PACU - hemodynamically stable.

## 2019-03-26 NOTE — Anesthesia Procedure Notes (Signed)
Procedure Name: LMA Insertion Date/Time: 03/26/2019 7:28 AM Performed by: Bufford Spikes, CRNA Pre-anesthesia Checklist: Patient identified, Emergency Drugs available, Suction available and Patient being monitored Patient Re-evaluated:Patient Re-evaluated prior to induction Oxygen Delivery Method: Circle system utilized Preoxygenation: Pre-oxygenation with 100% oxygen Induction Type: IV induction Ventilation: Mask ventilation without difficulty LMA: LMA inserted LMA Size: 4.0 Number of attempts: 1 Airway Equipment and Method: Bite block Placement Confirmation: positive ETCO2 Tube secured with: Tape Dental Injury: Teeth and Oropharynx as per pre-operative assessment

## 2019-03-26 NOTE — Transfer of Care (Signed)
Immediate Anesthesia Transfer of Care Note  Patient: Kyle Mcclure  Procedure(s) Performed: CYSTOSCOPY/RETROGRADE/URETEROSCOPY/STONE EXTRACTION WITH BASKET/ STENT PLACEMENT (Right Renal)  Patient Location: PACU  Anesthesia Type:General  Level of Consciousness: awake, alert  and oriented  Airway & Oxygen Therapy: Patient Spontanous Breathing and Patient connected to nasal cannula oxygen  Post-op Assessment: Report given to RN and Post -op Vital signs reviewed and stable  Post vital signs: Reviewed and stable  Last Vitals:  Vitals Value Taken Time  BP    Temp    Pulse 92 03/26/19 0825  Resp 21 03/26/19 0825  SpO2 94 % 03/26/19 0825  Vitals shown include unvalidated device data.  Last Pain:  Vitals:   03/26/19 0603  TempSrc: Oral  PainSc: 4       Patients Stated Pain Goal: 6 (29/56/21 3086)  Complications: No apparent anesthesia complications

## 2019-03-29 ENCOUNTER — Encounter (HOSPITAL_BASED_OUTPATIENT_CLINIC_OR_DEPARTMENT_OTHER): Payer: Self-pay | Admitting: Urology

## 2019-04-08 ENCOUNTER — Other Ambulatory Visit: Payer: Self-pay | Admitting: Nurse Practitioner

## 2019-04-08 DIAGNOSIS — E1169 Type 2 diabetes mellitus with other specified complication: Secondary | ICD-10-CM

## 2019-04-09 ENCOUNTER — Other Ambulatory Visit: Payer: Self-pay | Admitting: Nurse Practitioner

## 2019-04-09 DIAGNOSIS — E1169 Type 2 diabetes mellitus with other specified complication: Secondary | ICD-10-CM

## 2019-04-11 ENCOUNTER — Other Ambulatory Visit: Payer: Self-pay | Admitting: Nurse Practitioner

## 2019-04-11 DIAGNOSIS — E785 Hyperlipidemia, unspecified: Secondary | ICD-10-CM

## 2019-04-13 ENCOUNTER — Other Ambulatory Visit: Payer: Self-pay | Admitting: Nurse Practitioner

## 2019-04-13 DIAGNOSIS — E1169 Type 2 diabetes mellitus with other specified complication: Secondary | ICD-10-CM

## 2019-04-16 ENCOUNTER — Other Ambulatory Visit: Payer: Self-pay | Admitting: Nurse Practitioner

## 2019-04-16 DIAGNOSIS — E1169 Type 2 diabetes mellitus with other specified complication: Secondary | ICD-10-CM

## 2019-04-16 DIAGNOSIS — E669 Obesity, unspecified: Secondary | ICD-10-CM

## 2019-05-05 ENCOUNTER — Other Ambulatory Visit: Payer: Self-pay | Admitting: Nurse Practitioner

## 2019-05-05 DIAGNOSIS — E1169 Type 2 diabetes mellitus with other specified complication: Secondary | ICD-10-CM

## 2019-05-05 DIAGNOSIS — E785 Hyperlipidemia, unspecified: Secondary | ICD-10-CM

## 2019-05-05 DIAGNOSIS — E669 Obesity, unspecified: Secondary | ICD-10-CM

## 2019-05-14 ENCOUNTER — Other Ambulatory Visit: Payer: Self-pay | Admitting: Nurse Practitioner

## 2019-05-14 DIAGNOSIS — E1169 Type 2 diabetes mellitus with other specified complication: Secondary | ICD-10-CM

## 2019-05-16 ENCOUNTER — Other Ambulatory Visit: Payer: Self-pay | Admitting: Nurse Practitioner

## 2019-05-16 DIAGNOSIS — E1169 Type 2 diabetes mellitus with other specified complication: Secondary | ICD-10-CM

## 2019-05-16 DIAGNOSIS — E669 Obesity, unspecified: Secondary | ICD-10-CM

## 2019-05-16 DIAGNOSIS — E785 Hyperlipidemia, unspecified: Secondary | ICD-10-CM

## 2019-05-17 ENCOUNTER — Other Ambulatory Visit: Payer: Self-pay | Admitting: Nurse Practitioner

## 2019-05-17 DIAGNOSIS — E669 Obesity, unspecified: Secondary | ICD-10-CM

## 2019-05-17 DIAGNOSIS — E1169 Type 2 diabetes mellitus with other specified complication: Secondary | ICD-10-CM

## 2019-05-17 NOTE — Telephone Encounter (Signed)
Patient canceled appointment in July and did not wish to reschedule. We have continued to refill medication with the notation that an appointment is due.   On 05/05/2019 we provided patient with a 15 day supply in hopes of getting appointment scheduled.   I left patient a detailed voice message today requesting return call to schedule follow-up appointment   Request routed to Lauree Chandler, NP to further advise if ok to refill medications

## 2019-05-17 NOTE — Telephone Encounter (Signed)
Will await for a call back before refill is provided, he does need to follow up.

## 2019-05-18 NOTE — Telephone Encounter (Signed)
Spoke with patient, patient without insurance and recently started a new job (reason for not scheduling appointment sooner).  Pending appointment scheduled for 06/10/2018. Patient instructed to fast for same day labs.

## 2019-05-19 ENCOUNTER — Other Ambulatory Visit: Payer: Self-pay | Admitting: *Deleted

## 2019-05-19 DIAGNOSIS — E669 Obesity, unspecified: Secondary | ICD-10-CM

## 2019-05-19 DIAGNOSIS — E1169 Type 2 diabetes mellitus with other specified complication: Secondary | ICD-10-CM

## 2019-05-19 MED ORDER — TRULICITY 1.5 MG/0.5ML ~~LOC~~ SOAJ: SUBCUTANEOUS | 1 refills | Status: DC

## 2019-05-19 NOTE — Telephone Encounter (Signed)
Patient called and stated that pharmacy did not received Rx. Refaxed.  

## 2019-06-01 ENCOUNTER — Other Ambulatory Visit: Payer: Self-pay | Admitting: Nurse Practitioner

## 2019-06-01 DIAGNOSIS — E1169 Type 2 diabetes mellitus with other specified complication: Secondary | ICD-10-CM

## 2019-06-02 ENCOUNTER — Other Ambulatory Visit: Payer: Self-pay | Admitting: *Deleted

## 2019-06-02 DIAGNOSIS — E1169 Type 2 diabetes mellitus with other specified complication: Secondary | ICD-10-CM

## 2019-06-02 MED ORDER — PIOGLITAZONE HCL 15 MG PO TABS: ORAL_TABLET | ORAL | 0 refills | Status: DC

## 2019-06-02 NOTE — Telephone Encounter (Signed)
Patient requested refill Faxed to pharmacy.  

## 2019-06-11 ENCOUNTER — Other Ambulatory Visit: Payer: Self-pay

## 2019-06-11 ENCOUNTER — Ambulatory Visit (INDEPENDENT_AMBULATORY_CARE_PROVIDER_SITE_OTHER): Payer: Managed Care, Other (non HMO) | Admitting: Nurse Practitioner

## 2019-06-11 ENCOUNTER — Encounter: Payer: Self-pay | Admitting: Nurse Practitioner

## 2019-06-11 VITALS — BP 124/78 | HR 88 | Temp 97.3°F | Ht 68.0 in | Wt 235.0 lb

## 2019-06-11 DIAGNOSIS — E1169 Type 2 diabetes mellitus with other specified complication: Secondary | ICD-10-CM

## 2019-06-11 DIAGNOSIS — J302 Other seasonal allergic rhinitis: Secondary | ICD-10-CM

## 2019-06-11 DIAGNOSIS — R748 Abnormal levels of other serum enzymes: Secondary | ICD-10-CM

## 2019-06-11 DIAGNOSIS — I10 Essential (primary) hypertension: Secondary | ICD-10-CM | POA: Diagnosis not present

## 2019-06-11 DIAGNOSIS — E785 Hyperlipidemia, unspecified: Secondary | ICD-10-CM | POA: Diagnosis not present

## 2019-06-11 DIAGNOSIS — Z6835 Body mass index (BMI) 35.0-35.9, adult: Secondary | ICD-10-CM

## 2019-06-11 DIAGNOSIS — E669 Obesity, unspecified: Secondary | ICD-10-CM

## 2019-06-11 MED ORDER — JARDIANCE 25 MG PO TABS: ORAL_TABLET | ORAL | 1 refills | Status: DC

## 2019-06-11 MED ORDER — TRULICITY 1.5 MG/0.5ML ~~LOC~~ SOAJ: SUBCUTANEOUS | 11 refills | Status: DC

## 2019-06-11 MED ORDER — PIOGLITAZONE HCL 15 MG PO TABS: ORAL_TABLET | ORAL | 1 refills | Status: DC

## 2019-06-11 MED ORDER — ATORVASTATIN CALCIUM 10 MG PO TABS: ORAL_TABLET | ORAL | 5 refills | Status: DC

## 2019-06-11 MED ORDER — METFORMIN HCL 1000 MG PO TABS: ORAL_TABLET | ORAL | 5 refills | Status: DC

## 2019-06-11 MED ORDER — LISINOPRIL 10 MG PO TABS: 10.0000 mg | ORAL_TABLET | Freq: Every day | ORAL | 1 refills | Status: DC

## 2019-06-11 NOTE — Patient Instructions (Signed)
Increase physical activity, 30 mins 5 days a week.  DASH Eating Plan DASH stands for "Dietary Approaches to Stop Hypertension." The DASH eating plan is a healthy eating plan that has been shown to reduce high blood pressure (hypertension). It may also reduce your risk for type 2 diabetes, heart disease, and stroke. The DASH eating plan may also help with weight loss. What are tips for following this plan?  General guidelines  Avoid eating more than 2,300 mg (milligrams) of salt (sodium) a day. If you have hypertension, you may need to reduce your sodium intake to 1,500 mg a day.  Limit alcohol intake to no more than 1 drink a day for nonpregnant women and 2 drinks a day for men. One drink equals 12 oz of beer, 5 oz of wine, or 1 oz of hard liquor.  Work with your health care provider to maintain a healthy body weight or to lose weight. Ask what an ideal weight is for you.  Get at least 30 minutes of exercise that causes your heart to beat faster (aerobic exercise) most days of the week. Activities may include walking, swimming, or biking.  Work with your health care provider or diet and nutrition specialist (dietitian) to adjust your eating plan to your individual calorie needs. Reading food labels   Check food labels for the amount of sodium per serving. Choose foods with less than 5 percent of the Daily Value of sodium. Generally, foods with less than 300 mg of sodium per serving fit into this eating plan.  To find whole grains, look for the word "whole" as the first word in the ingredient list. Shopping  Buy products labeled as "low-sodium" or "no salt added."  Buy fresh foods. Avoid canned foods and premade or frozen meals. Cooking  Avoid adding salt when cooking. Use salt-free seasonings or herbs instead of table salt or sea salt. Check with your health care provider or pharmacist before using salt substitutes.  Do not fry foods. Cook foods using healthy methods such as baking,  boiling, grilling, and broiling instead.  Cook with heart-healthy oils, such as olive, canola, soybean, or sunflower oil. Meal planning  Eat a balanced diet that includes: ? 5 or more servings of fruits and vegetables each day. At each meal, try to fill half of your plate with fruits and vegetables. ? Up to 6-8 servings of whole grains each day. ? Less than 6 oz of lean meat, poultry, or fish each day. A 3-oz serving of meat is about the same size as a deck of cards. One egg equals 1 oz. ? 2 servings of low-fat dairy each day. ? A serving of nuts, seeds, or beans 5 times each week. ? Heart-healthy fats. Healthy fats called Omega-3 fatty acids are found in foods such as flaxseeds and coldwater fish, like sardines, salmon, and mackerel.  Limit how much you eat of the following: ? Canned or prepackaged foods. ? Food that is high in trans fat, such as fried foods. ? Food that is high in saturated fat, such as fatty meat. ? Sweets, desserts, sugary drinks, and other foods with added sugar. ? Full-fat dairy products.  Do not salt foods before eating.  Try to eat at least 2 vegetarian meals each week.  Eat more home-cooked food and less restaurant, buffet, and fast food.  When eating at a restaurant, ask that your food be prepared with less salt or no salt, if possible. What foods are recommended? The items listed may  not be a complete list. Talk with your dietitian about what dietary choices are best for you. Grains Whole-grain or whole-wheat bread. Whole-grain or whole-wheat pasta. Brown rice. [place] Morrow. Bulgur. Whole-grain and low-sodium cereals. Pita bread. Low-fat, low-sodium crackers. Whole-wheat flour tortillas. Vegetables Fresh or frozen vegetables (raw, steamed, roasted, or grilled). Low-sodium or reduced-sodium tomato and vegetable juice. Low-sodium or reduced-sodium tomato sauce and tomato paste. Low-sodium or reduced-sodium canned vegetables. Fruits All fresh, dried, or  frozen fruit. Canned fruit in natural juice (without added sugar). Meat and other protein foods Skinless chicken or [place]. Ground chicken or [place]. Pork with fat trimmed off. Fish and seafood. Egg whites. Dried beans, peas, or lentils. Unsalted nuts, nut butters, and seeds. Unsalted canned beans. Lean cuts of beef with fat trimmed off. Low-sodium, lean deli meat. Dairy Low-fat (1%) or fat-free (skim) milk. Fat-free, low-fat, or reduced-fat cheeses. Nonfat, low-sodium ricotta or cottage cheese. Low-fat or nonfat yogurt. Low-fat, low-sodium cheese. Fats and oils Soft margarine without trans fats. Vegetable oil. Low-fat, reduced-fat, or light mayonnaise and salad dressings (reduced-sodium). Canola, safflower, olive, soybean, and sunflower oils. Avocado. Seasoning and other foods Herbs. Spices. Seasoning mixes without salt. Unsalted popcorn and pretzels. Fat-free sweets. What foods are not recommended? The items listed may not be a complete list. Talk with your dietitian about what dietary choices are best for you. Grains Baked goods made with fat, such as croissants, muffins, or some breads. Dry pasta or rice meal packs. Vegetables Creamed or fried vegetables. Vegetables in a cheese sauce. Regular canned vegetables (not low-sodium or reduced-sodium). Regular canned tomato sauce and paste (not low-sodium or reduced-sodium). Regular tomato and vegetable juice (not low-sodium or reduced-sodium). Angie Fava. Olives. Fruits Canned fruit in a light or heavy syrup. Fried fruit. Fruit in cream or butter sauce. Meat and other protein foods Fatty cuts of meat. Ribs. Fried meat. Berniece Salines. Sausage. Bologna and other processed lunch meats. Salami. Fatback. Hotdogs. Bratwurst. Salted nuts and seeds. Canned beans with added salt. Canned or smoked fish. Whole eggs or egg yolks. Chicken or [place] with skin. Dairy Whole or 2% milk, cream, and half-and-half. Whole or full-fat cream cheese. Whole-fat or sweetened yogurt.  Full-fat cheese. Nondairy creamers. Whipped toppings. Processed cheese and cheese spreads. Fats and oils Butter. Stick margarine. Lard. Shortening. Ghee. Bacon fat. Tropical oils, such as coconut, palm kernel, or palm oil. Seasoning and other foods Salted popcorn and pretzels. Onion salt, garlic salt, seasoned salt, table salt, and sea salt. Worcestershire sauce. Tartar sauce. Barbecue sauce. Teriyaki sauce. Soy sauce, including reduced-sodium. Steak sauce. Canned and packaged gravies. Fish sauce. Oyster sauce. Cocktail sauce. Horseradish that you find on the shelf. Ketchup. Mustard. Meat flavorings and tenderizers. Bouillon cubes. Hot sauce and Tabasco sauce. Premade or packaged marinades. Premade or packaged taco seasonings. Relishes. Regular salad dressings. Where to find more information:  National Heart, Lung, and [place]: https://wilson-eaton.com/  American Heart Association: www.heart.org Summary  The DASH eating plan is a healthy eating plan that has been shown to reduce high blood pressure (hypertension). It may also reduce your risk for type 2 diabetes, heart disease, and stroke.  With the DASH eating plan, you should limit salt (sodium) intake to 2,300 mg a day. If you have hypertension, you may need to reduce your sodium intake to 1,500 mg a day.  When on the DASH eating plan, aim to eat more fresh fruits and vegetables, whole grains, lean proteins, low-fat dairy, and heart-healthy fats.  Work with your health care provider or diet and  nutrition specialist (dietitian) to adjust your eating plan to your individual calorie needs. This information is not intended to replace advice given to you by your health care provider. Make sure you discuss any questions you have with your health care provider. Document Revised: 05/02/2017 Document Reviewed: 05/13/2016 Elsevier Patient Education  2020 [employer].

## 2019-06-11 NOTE — Progress Notes (Signed)
Careteam: Patient Care Team: Lauree Chandler, NP as PCP - General (Nurse Practitioner) Sharol Roussel, [employer] as Consulting Physician (Optometry) Kathie Rhodes, MD as Consulting Physician (Urology)  Advanced Directive information    Allergies  Allergen Reactions  . Hydrocodone Nausea And Vomiting  . Oxycodone Nausea And Vomiting    Chief Complaint  Patient presents with  . Medical Management of Chronic Issues    8 month follow-up   . Quality Metric Gaps    Foot exam due today. Discuss need for eye exam and A1c   . Immunizations    Discuss need for TD vaccine   . Best Practice Recommendations    Discuss HIV reccomendation      HPI: Patient is a 59 y.o. male seen in the office today for follow up  DM- has not had eye exam due to not having insurance, has appt feb 5th with Dr Katy Fitch.  He feels like blood sugars are good but has not been checking them.  He has cut back on carbohydrates and sugars.  No hypoglycemic episodes.  No numbness or tingling in feet unless he sits a long time.   htn- blood pressure at goal on lisinopril   Seasonal allergies- not good. On zyrtec and flonase. Afraid to sneeze in pubic due to COVID.   Elevated liver enzymes- GI following- needs to make another appt to follow up  Hyperlipidemia- continues on lipitor. LDL goal <70.   Hx of kidney stones- pain got severe therefore he had to urology to have removed via cystoscopy -no further symptoms.    Review of Systems:  Review of Systems  Constitutional: Negative for chills, fever and weight loss.  HENT: Negative for tinnitus.   Respiratory: Negative for cough, sputum production and shortness of breath.   Cardiovascular: Negative for chest pain, palpitations and leg swelling.  Gastrointestinal: Negative for abdominal pain, constipation, diarrhea and heartburn.  Genitourinary: Negative for dysuria, frequency and urgency.  Musculoskeletal: Negative for back pain, falls, joint pain and myalgias.    Skin: Negative.   Neurological: Negative for dizziness and headaches.  Psychiatric/Behavioral: Negative for depression and memory loss. The patient does not have insomnia.     Past Medical History:  Diagnosis Date  . Fatty liver    followed by dr Ardis Hughs--- mild elevated LFT since 2015,  blood work-up done 05/ 2020 (epic), and abd. ultrasound 10-16-2018 (epic),  recommendation loss weight  . History of kidney stones   . Hyperlipidemia   . Hypertension   . Seasonal allergies   . Type 2 diabetes mellitus ([place])    followed by pcp--  fasting cbg-- 140;  had been checking blood surgar past few weeks  . Ureteral calculi    right  . Wears contact lenses    Past Surgical History:  Procedure Laterality Date  . COLONOSCOPY WITH PROPOFOL  03-13-2015  dr Ardis Hughs  . CYSTOSCOPY WITH HOLMIUM LASER LITHOTRIPSY  12-23-2003   dr Karsten Ro  @[email or web address]   . CYSTOSCOPY/RETROGRADE/URETEROSCOPY/STONE EXTRACTION WITH BASKET Right 03/26/2019   Procedure: CYSTOSCOPY/RETROGRADE/URETEROSCOPY/STONE EXTRACTION WITH BASKET/ STENT PLACEMENT;  Surgeon: Kathie Rhodes, MD;  Location: [facility];  Service: Urology;  Laterality: Right;  . EXTRACORPOREAL SHOCK WAVE LITHOTRIPSY  11-05-2012   @[facility]   . TONSILLECTOMY  child   Social History:   reports that he quit smoking about 29 years ago. His smoking use included cigarettes. He has a 1.50 pack-year smoking history. He has never used smokeless tobacco. He reports current alcohol use of about 4.0  standard drinks of alcohol per week. He reports that he does not use drugs.  Family History  Problem Relation Age of Onset  . Cancer Mother   . Colon cancer Neg Hx   . Rectal cancer Neg Hx   . Stomach cancer Neg Hx   . Esophageal cancer Neg Hx     Medications: Patient's Medications  New Prescriptions   No medications on file  Previous Medications   ASPIRIN 81 MG TABLET    Take 81 mg by mouth daily.   ATORVASTATIN (LIPITOR) 10 MG TABLET    TAKE 1 TABLET BY MOUTH  EVERY DAY *APPOINTMENT OVERDUE   CETIRIZINE (ZYRTEC) 10 MG TABLET    Take 10 mg by mouth daily.   DULAGLUTIDE (TRULICITY) 1.5 MG/0.5ML SOPN    Inject 1.5mg  into the skin once a week.   EMPAGLIFLOZIN (JARDIANCE) 25 MG TABS TABLET    TAKE 1 TABLET BY MOUTH EVERY DAY   FLUTICASONE (FLONASE) 50 MCG/ACT NASAL SPRAY    Place 1 spray into both nostrils as needed for allergies or rhinitis.   GLUCOSE BLOOD (ONETOUCH VERIO) TEST STRIP    Use to test blood sugar twice daily Dx: E11.69   IBUPROFEN (ADVIL) 200 MG TABLET    Take 200 mg by mouth every 6 (six) hours as needed.   LANCET DEVICES (ONE TOUCH DELICA LANCING DEV) MISC    Check blood sugar 2 x daily as directed DX: 250.02   LISINOPRIL (ZESTRIL) 10 MG TABLET    Take 1 tablet (10 mg total) by mouth daily.   METFORMIN (GLUCOPHAGE) 1000 MG TABLET    TAKE 1 TABLET BY MOUTH 2 TIMES DAILY WITH A MEAL. PLEASE CALL [facility] OFFICE NEED FOLLOW UP APPOINTMENT BEFORE MORE REFILLS.   NAPROXEN SODIUM (ALEVE) 220 MG TABLET    Take 220 mg by mouth as needed.   PIOGLITAZONE (ACTOS) 15 MG TABLET    TAKE 1 TABLET BY MOUTH DAILY   POTASSIUM CITRATE (UROCIT-K) 10 MEQ (1080 MG) SR TABLET    at bedtime. 2 by mouth once daily to prevent kidney stones   Modified Medications   No medications on file  Discontinued Medications   PHENAZOPYRIDINE (PYRIDIUM) 200 MG TABLET    Take 1 tablet (200 mg total) by mouth 3 (three) times daily as needed for pain.   PROMETHAZINE (PHENERGAN) 12.5 MG TABLET    Take 12.5 mg by mouth 2 (two) times daily.   TRAMADOL (ULTRAM) 50 MG TABLET    Take by mouth 2 (two) times daily.   TRAMADOL (ULTRAM) 50 MG TABLET    Take 1 tablet (50 mg total) by mouth every 6 (six) hours as needed.    Physical Exam:  Vitals:   06/11/19 0930  BP: 124/78  Pulse: 88  Temp: (!) 97.3 F (36.3 C)  TempSrc: Temporal  SpO2: 96%  Weight: 235 lb (106.6 kg)  Height: 5\' 8"  (1.727 m)   Body mass index is 35.73 kg/m. Wt Readings from Last 3 Encounters:  06/11/19 235 lb  (106.6 kg)  03/26/19 229 lb 1 oz (103.9 kg)  10/13/18 240 lb (108.9 kg)    Physical Exam Constitutional:      General: He is not in acute distress.    Appearance: He is well-developed. He is not diaphoretic.  HENT:     Head: Normocephalic and atraumatic.     Mouth/Throat:     Pharynx: No oropharyngeal exudate.  Eyes:     Conjunctiva/sclera: Conjunctivae normal.     Pupils:  Pupils are equal, round, and reactive to light.  Cardiovascular:     Rate and Rhythm: Normal rate and regular rhythm.     Pulses:          Dorsalis pedis pulses are 2+ on the right side and 2+ on the left side.     Heart sounds: Normal heart sounds.  Pulmonary:     Effort: Pulmonary effort is normal.     Breath sounds: Normal breath sounds.  Abdominal:     General: Bowel sounds are normal.     Palpations: Abdomen is soft.  Musculoskeletal:        General: No tenderness.     Cervical back: Normal range of motion and neck supple.  Feet:     Right foot:     Protective Sensation: 3 sites tested. 3 sites sensed.     Skin integrity: Skin integrity normal.     Left foot:     Protective Sensation: 3 sites tested. 3 sites sensed.     Skin integrity: Skin integrity normal.  Skin:    General: Skin is warm and dry.  Neurological:     Mental Status: He is alert and oriented to person, place, and time.     Labs reviewed: Basic Metabolic Panel: Recent Labs    09/22/18 0810 10/14/18 1113 03/26/19 0624  NA 138 136 135  K 4.3 4.3 4.2  CL 102 99 99  CO2 26 23  --   GLUCOSE 147* 211* 159*  BUN 14 17 24*  CREATININE 0.94 0.88 1.20  CALCIUM 10.0 10.1  --    Liver Function Tests: Recent Labs    09/22/18 0810 10/14/18 1113  AST 53* 41*  ALT 62* 56*  ALKPHOS  --  87  BILITOT 1.1 1.2  PROT 7.2 8.0  ALBUMIN  --  4.8   No results for input(s): LIPASE, AMYLASE in the last 8760 hours. No results for input(s): AMMONIA in the last 8760 hours. CBC: Recent Labs    09/22/18 0810 10/14/18 1113  03/26/19 0624  WBC 8.1 10.1  --   NEUTROABS 5,168 7.0  --   HGB 15.7 17.3* 16.7  HCT 45.9 50.4 49.0  MCV 89.8 90.5  --   PLT 249 252.0  --    Lipid Panel: Recent Labs    09/22/18 0810  CHOL 162  HDL 32*  LDLCALC 89  TRIG 010*  CHOLHDL 5.1*   TSH: No results for input(s): TSH in the last 8760 hours. A1C: Lab Results  Component Value Date   HGBA1C 7.5 (H) 09/22/2018     Assessment/Plan 1. Diabetes mellitus type 2 in obese [facility]) -will follow up a1c today -Encouraged dietary compliance and routine exercise, routine foot care/monitoring and to keep up with diabetic eye exams through ophthalmology  - Dulaglutide (TRULICITY) 1.5 MG/0.5ML SOPN; Inject 1.5mg  into the skin once a week.  Dispense: 1 pen; Refill: 11 - metFORMIN (GLUCOPHAGE) 1000 MG tablet; TAKE 1 TABLET BY MOUTH 2 TIMES DAILY WITH A MEAL. PLEASE CALL [facility] OFFICE NEED FOLLOW UP APPOINTMENT BEFORE MORE REFILLS.  Dispense: 60 tablet; Refill: 5 - pioglitazone (ACTOS) 15 MG tablet; TAKE 1 TABLET BY MOUTH DAILY  Dispense: 90 tablet; Refill: 1 - empagliflozin (JARDIANCE) 25 MG TABS tablet; TAKE 1 TABLET BY MOUTH EVERY DAY  Dispense: 90 tablet; Refill: 1 - Hemoglobin A1c  2. Essential hypertension Controlled today in office, encouraged DASH diet and routine exercise - lisinopril (ZESTRIL) 10 MG tablet; Take 1 tablet (10 mg total)  by mouth daily.  Dispense: 90 tablet; Refill: 1 - COMPLETE METABOLIC PANEL WITH GFR - CBC with Differential/Platelet  3. Hyperlipidemia LDL goal <70 Continues on lipitor, continue to work on diet modifications and exercise. - atorvastatin (LIPITOR) 10 MG tablet; TAKE 1 TABLET BY MOUTH EVERY DAY  Dispense: 30 tablet; Refill: 5 - Lipid Panel - COMPLETE METABOLIC PANEL WITH GFR  4. Elevated liver enzymes -followed by GI, will recheck enzymes today, needs to make follow up appt.  5. Seasonal allergies Feels like mask makes it worse. conines on zyrtec and flonase  6. Obesity -discussed  benefits of weight loss through diet and exercise.   Next appt: 6 months for CPE Otho Michalik K. Biagio Borg  [facility] [phone]

## 2019-06-12 LAB — LIPID PANEL
Cholesterol: 151 mg/dL (ref <200)
HDL: 35 mg/dL — ABNORMAL LOW (ref >=40)
LDL Cholesterol (Calc): 87 mg/dL (calc)
Non-HDL Cholesterol (Calc): 116 mg/dL (calc) (ref <130)
Total CHOL/HDL Ratio: 4.3 (calc) (ref <5.0)
Triglycerides: 194 mg/dL — ABNORMAL HIGH (ref <150)

## 2019-06-12 LAB — CBC WITH DIFFERENTIAL/PLATELET
Absolute Monocytes: 516 cells/uL (ref 200–950)
Basophils Absolute: 112 cells/uL (ref 0–200)
Basophils Relative: 1.3 %
Eosinophils Absolute: 396 cells/uL (ref 15–500)
Eosinophils Relative: 4.6 %
HCT: 45.4 % (ref 38.5–50.0)
Hemoglobin: 15.5 g/dL (ref 13.2–17.1)
Lymphs Abs: 2442 cells/uL (ref 850–3900)
MCH: 30.5 pg (ref 27.0–33.0)
MCHC: 34.1 g/dL (ref 32.0–36.0)
MCV: 89.2 fL (ref 80.0–100.0)
MPV: 9.3 fL (ref 7.5–12.5)
Monocytes Relative: 6 %
Neutro Abs: 5134 cells/uL (ref 1500–7800)
Neutrophils Relative %: 59.7 %
Platelets: 280 10*3/uL (ref 140–400)
RBC: 5.09 10*6/uL (ref 4.20–5.80)
RDW: 12.3 % (ref 11.0–15.0)
Total Lymphocyte: 28.4 %
WBC: 8.6 10*3/uL (ref 3.8–10.8)

## 2019-06-12 LAB — COMPLETE METABOLIC PANEL WITH GFR
AG Ratio: 1.7 (calc) (ref 1.0–2.5)
ALT: 57 U/L — ABNORMAL HIGH (ref 9–46)
AST: 58 U/L — ABNORMAL HIGH (ref 10–35)
Albumin: 4.7 g/dL (ref 3.6–5.1)
Alkaline phosphatase (APISO): 71 U/L (ref 35–144)
BUN: 17 mg/dL (ref 7–25)
CO2: 23 mmol/L (ref 20–32)
Calcium: 9.9 mg/dL (ref 8.6–10.3)
Chloride: 102 mmol/L (ref 98–110)
Creat: 0.96 mg/dL (ref 0.70–1.33)
GFR, Est African American: 101 mL/min/{1.73_m2} (ref >=60)
GFR, Est Non African American: 87 mL/min/{1.73_m2} (ref >=60)
Globulin: 2.8 g/dL (calc) (ref 1.9–3.7)
Glucose, Bld: 123 mg/dL — ABNORMAL HIGH (ref 65–99)
Potassium: 4.5 mmol/L (ref 3.5–5.3)
Sodium: 138 mmol/L (ref 135–146)
Total Bilirubin: 1 mg/dL (ref 0.2–1.2)
Total Protein: 7.5 g/dL (ref 6.1–8.1)

## 2019-06-12 LAB — HEMOGLOBIN A1C
Hgb A1c MFr Bld: 7 % of total Hgb — ABNORMAL HIGH (ref <5.7)
Mean Plasma Glucose: 154 (calc)
eAG (mmol/L): 8.5 (calc)

## 2019-06-15 ENCOUNTER — Other Ambulatory Visit: Payer: Self-pay

## 2019-06-15 ENCOUNTER — Other Ambulatory Visit: Payer: Self-pay | Admitting: *Deleted

## 2019-06-15 DIAGNOSIS — E669 Obesity, unspecified: Secondary | ICD-10-CM

## 2019-06-15 DIAGNOSIS — E1169 Type 2 diabetes mellitus with other specified complication: Secondary | ICD-10-CM

## 2019-06-15 MED ORDER — TRULICITY 1.5 MG/0.5ML ~~LOC~~ SOAJ: SUBCUTANEOUS | 11 refills | Status: DC

## 2019-06-15 NOTE — Telephone Encounter (Signed)
Patient requested Rx. Refaxed to pharmacy.

## 2019-06-17 ENCOUNTER — Other Ambulatory Visit: Payer: Self-pay

## 2019-06-17 ENCOUNTER — Encounter: Payer: Self-pay | Admitting: Nurse Practitioner

## 2019-06-17 DIAGNOSIS — E785 Hyperlipidemia, unspecified: Secondary | ICD-10-CM

## 2019-06-18 NOTE — Telephone Encounter (Signed)
Synetta Fail please advise if you have initiated a PA or received information from the pharmacy on this patient

## 2019-06-24 ENCOUNTER — Encounter: Payer: Self-pay | Admitting: Nurse Practitioner

## 2019-07-09 ENCOUNTER — Telehealth: Payer: Self-pay

## 2019-07-09 ENCOUNTER — Encounter: Payer: Self-pay | Admitting: Nurse Practitioner

## 2019-07-09 LAB — HM DIABETES EYE EXAM

## 2019-07-09 NOTE — Telephone Encounter (Signed)
Please refer to mychart message sent by patient dated 07/09/2019.  As a results of incoming message I contacted patients pharmacy and verified patient needed a PA for Trulicity and confirmed RX BIN number as [id number]  I had to contact patient to get his ID number and confirm Insurance. Patient RX insurance is through [employer] and the ID number is [id number]  Patient states he tried to provide an updated copy of insurance card at last visit and was told we did not need it.   PA initiated through CoverMyMeds  Key ID [id number]  Your information has been submitted to [employer]. [employer] is reviewing the PA request and you will receive an electronic response. You may check for the updated outcome later by reopening this request. The standard fax determination will also be sent to you directly.  If you have any questions about your PA submission, contact [employer] at [phone].  Awaiting reply from insurance company

## 2019-07-12 NOTE — Telephone Encounter (Signed)
Received fax through [employer] Stating that Trulicity has been APPROVED from 07/09/2019-07/08/2020. ID #: [id number] Case ID#: [id number]

## 2019-07-29 IMAGING — US ULTRASOUND ABDOMEN LIMITED
1 series · 14 of 25 positions shown · non-contrast
Comparison: 01/27/2017

CLINICAL DATA: Elevated liver function tests.

EXAM:
ULTRASOUND ABDOMEN LIMITED RIGHT UPPER QUADRANT

[Series 1: ultrasound abdomen limited · 0.28mm/px · 14 of 48 slices shown]
[im 1/48]
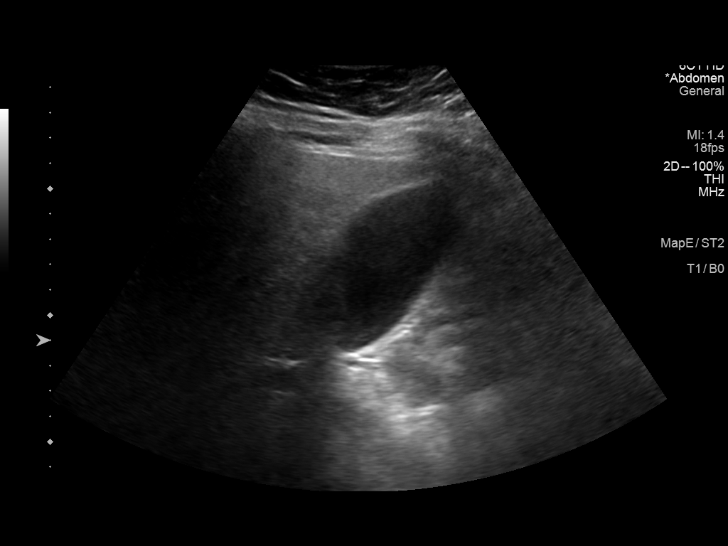
[im 4/48]
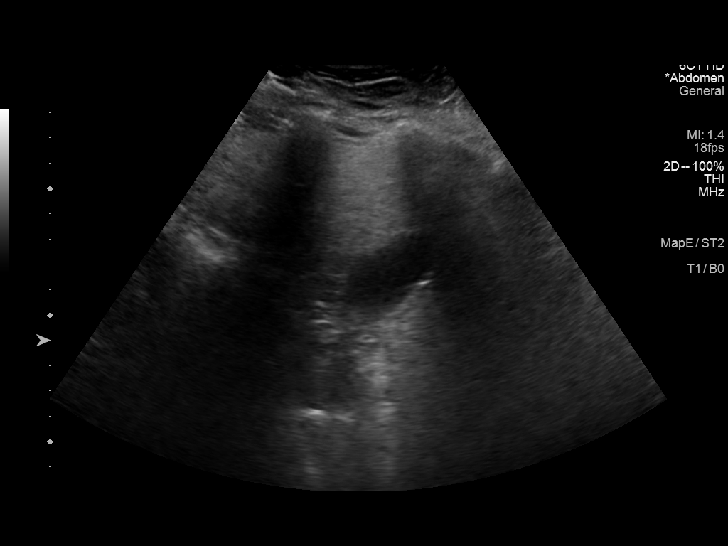
[im 8/48]
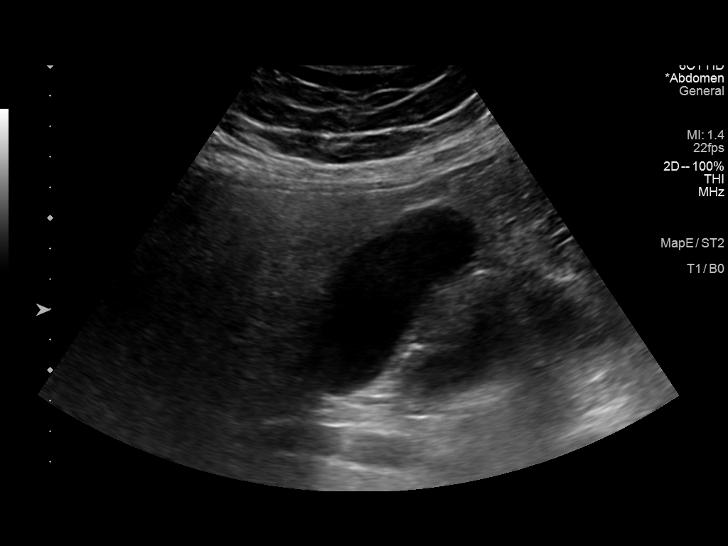
[im 12/48]
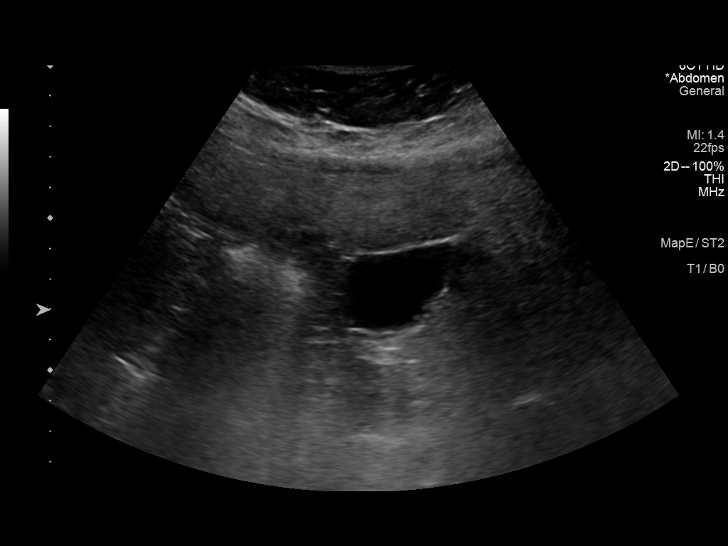
[im 16/48]
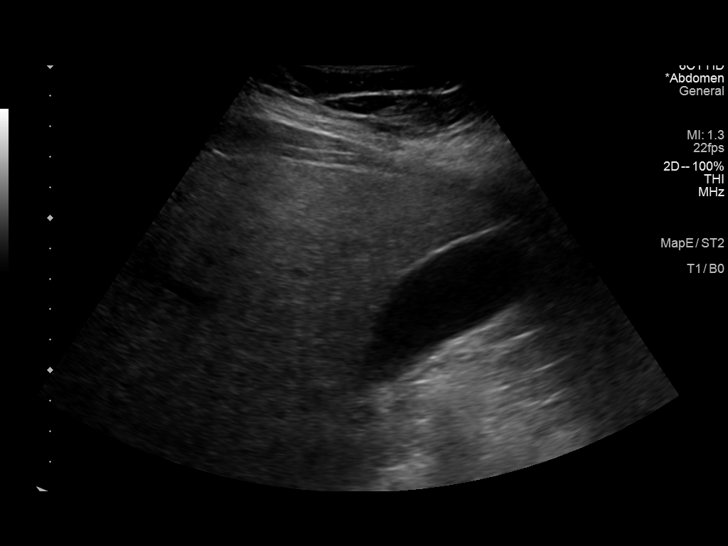
[im 18/48]
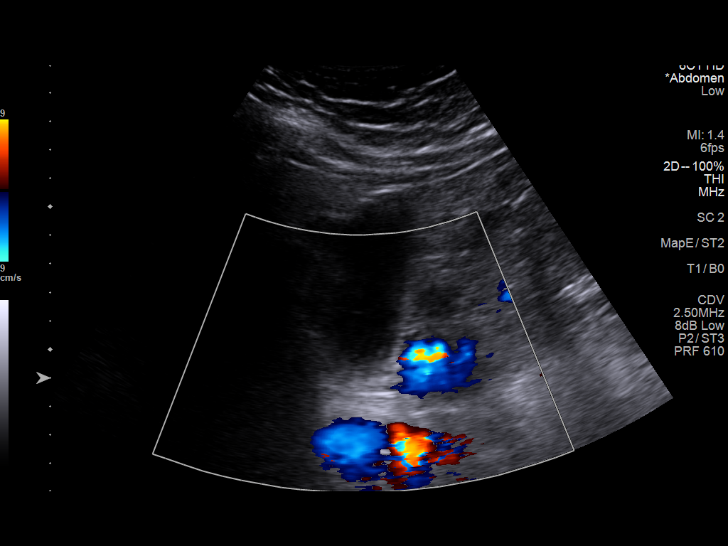
[im 22/48]
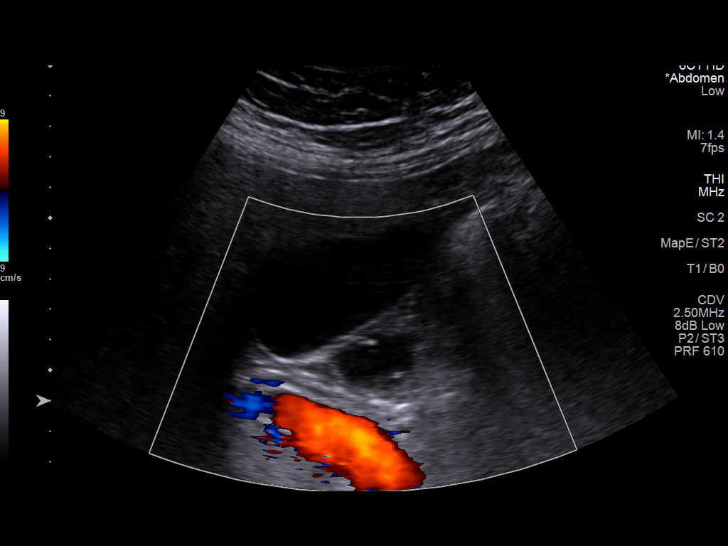
[im 26/48]
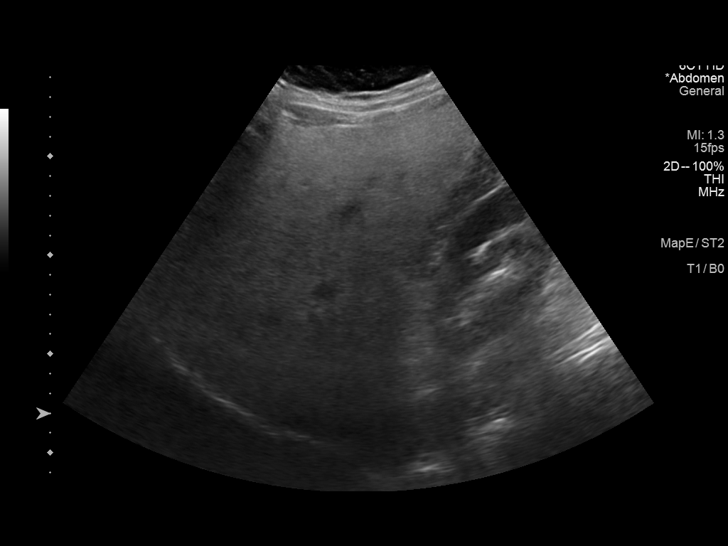
[im 30/48]
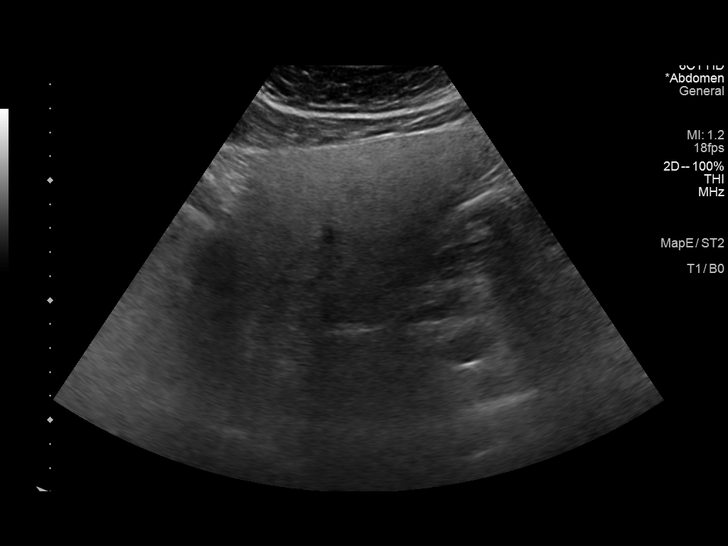
[im 32/48]
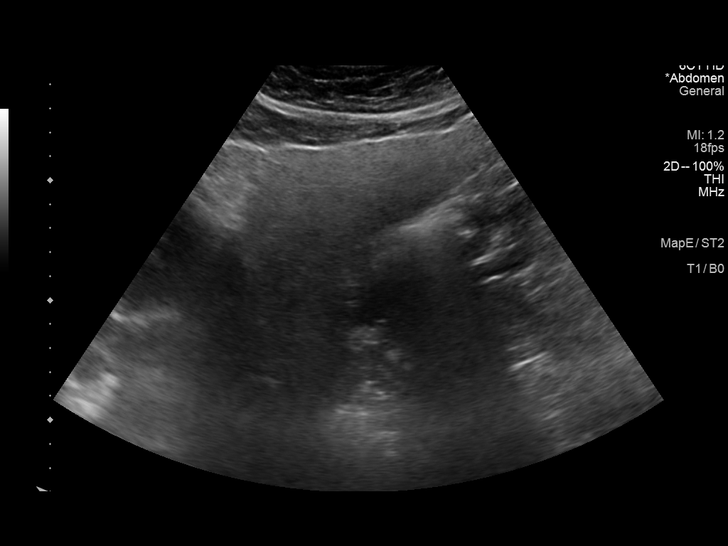
[im 36/48]
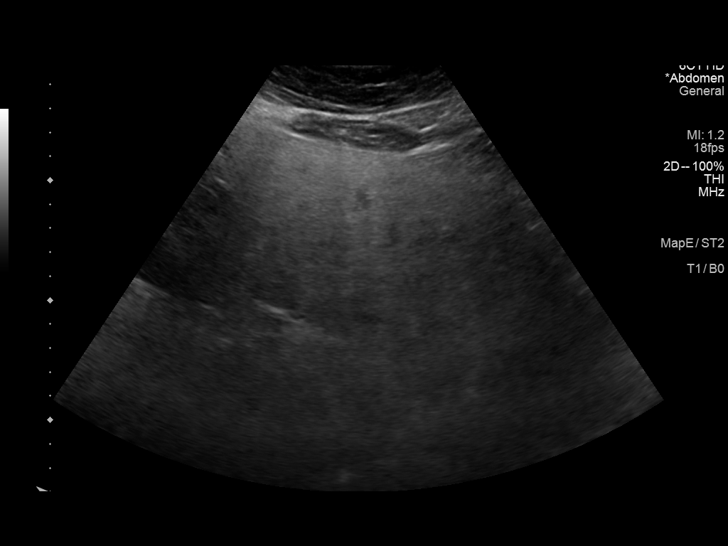
[im 40/48]
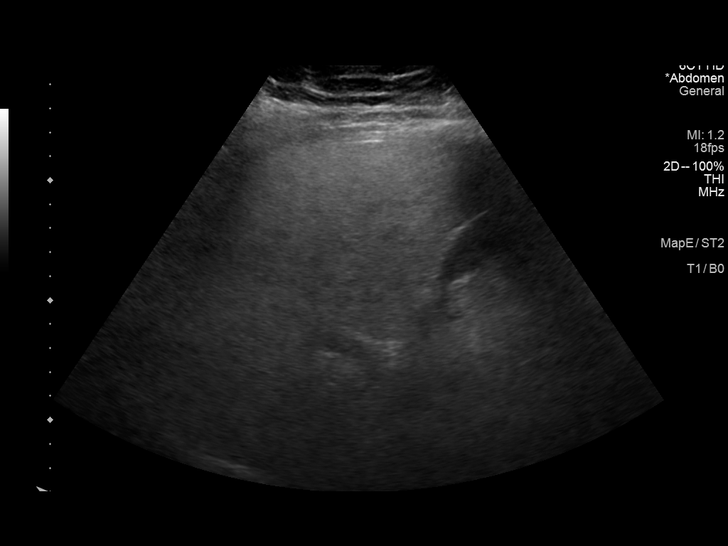
[im 44/48]
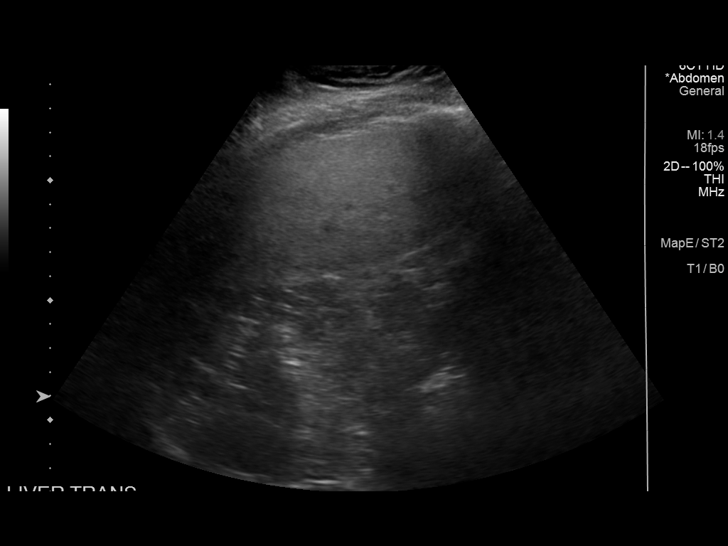
[im 48/48]
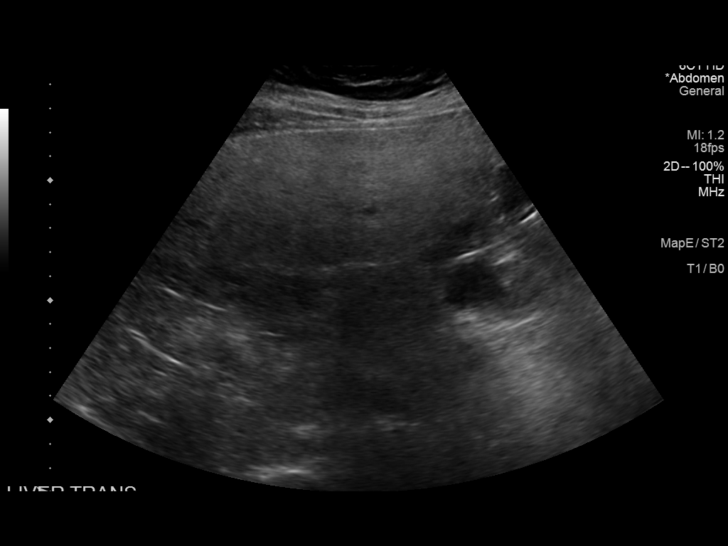

[14 of 25 positions shown; findings below may reference images not displayed]

FINDINGS: Gallbladder:

No gallstones or wall thickening visualized. No sonographic Murphy
sign noted by sonographer.

Common bile duct:

Diameter: 2 mm

Liver:

Inhomogeneous and diffusely increased hepatic parenchymal
echotexture with attenuation of the ultrasound beam limiting
assessment and with a similar appearance on the prior study. No
focal liver lesion identified. Portal vein is patent on color
Doppler imaging with normal direction of blood flow towards the
liver.
IMPRESSION: 1. Echogenic liver compatible with steatosis.
2. No gallstones or biliary dilatation.

## 2019-09-10 ENCOUNTER — Other Ambulatory Visit: Payer: Managed Care, Other (non HMO)

## 2019-09-10 ENCOUNTER — Other Ambulatory Visit: Payer: Self-pay

## 2019-09-10 DIAGNOSIS — E785 Hyperlipidemia, unspecified: Secondary | ICD-10-CM

## 2019-09-11 LAB — COMPLETE METABOLIC PANEL WITH GFR
AG Ratio: 1.7 (calc) (ref 1.0–2.5)
ALT: 52 U/L — ABNORMAL HIGH (ref 9–46)
AST: 45 U/L — ABNORMAL HIGH (ref 10–35)
Albumin: 4.7 g/dL (ref 3.6–5.1)
Alkaline phosphatase (APISO): 76 U/L (ref 35–144)
BUN: 20 mg/dL (ref 7–25)
CO2: 23 mmol/L (ref 20–32)
Calcium: 9.6 mg/dL (ref 8.6–10.3)
Chloride: 101 mmol/L (ref 98–110)
Creat: 0.91 mg/dL (ref 0.70–1.33)
GFR, Est African American: 107 mL/min/{1.73_m2} (ref >=60)
GFR, Est Non African American: 93 mL/min/{1.73_m2} (ref >=60)
Globulin: 2.7 g/dL (calc) (ref 1.9–3.7)
Glucose, Bld: 131 mg/dL — ABNORMAL HIGH (ref 65–99)
Potassium: 4.4 mmol/L (ref 3.5–5.3)
Sodium: 138 mmol/L (ref 135–146)
Total Bilirubin: 1 mg/dL (ref 0.2–1.2)
Total Protein: 7.4 g/dL (ref 6.1–8.1)

## 2019-09-11 LAB — HEMOGLOBIN A1C
Hgb A1c MFr Bld: 6.9 % of total Hgb — ABNORMAL HIGH (ref <5.7)
Mean Plasma Glucose: 151 (calc)
eAG (mmol/L): 8.4 (calc)

## 2019-09-11 LAB — LIPID PANEL
Cholesterol: 161 mg/dL (ref <200)
HDL: 34 mg/dL — ABNORMAL LOW (ref >=40)
LDL Cholesterol (Calc): 92 mg/dL (calc)
Non-HDL Cholesterol (Calc): 127 mg/dL (calc) (ref <130)
Total CHOL/HDL Ratio: 4.7 (calc) (ref <5.0)
Triglycerides: 263 mg/dL — ABNORMAL HIGH (ref <150)

## 2019-09-17 ENCOUNTER — Other Ambulatory Visit: Payer: Self-pay

## 2019-09-17 ENCOUNTER — Ambulatory Visit (INDEPENDENT_AMBULATORY_CARE_PROVIDER_SITE_OTHER): Payer: Managed Care, Other (non HMO) | Admitting: Nurse Practitioner

## 2019-09-17 ENCOUNTER — Encounter: Payer: Self-pay | Admitting: Nurse Practitioner

## 2019-09-17 VITALS — BP 134/83 | HR 70 | Temp 96.9°F | Ht 68.0 in | Wt 238.6 lb

## 2019-09-17 DIAGNOSIS — I10 Essential (primary) hypertension: Secondary | ICD-10-CM

## 2019-09-17 DIAGNOSIS — E1169 Type 2 diabetes mellitus with other specified complication: Secondary | ICD-10-CM

## 2019-09-17 DIAGNOSIS — Z6836 Body mass index (BMI) 36.0-36.9, adult: Secondary | ICD-10-CM

## 2019-09-17 DIAGNOSIS — E785 Hyperlipidemia, unspecified: Secondary | ICD-10-CM

## 2019-09-17 DIAGNOSIS — R748 Abnormal levels of other serum enzymes: Secondary | ICD-10-CM

## 2019-09-17 DIAGNOSIS — E669 Obesity, unspecified: Secondary | ICD-10-CM

## 2019-09-17 MED ORDER — METFORMIN HCL 1000 MG PO TABS: ORAL_TABLET | ORAL | 0 refills | Status: DC

## 2019-09-17 MED ORDER — ATORVASTATIN CALCIUM 20 MG PO TABS: ORAL_TABLET | ORAL | 1 refills | Status: DC

## 2019-09-17 MED ORDER — JARDIANCE 25 MG PO TABS: ORAL_TABLET | ORAL | 0 refills | Status: DC

## 2019-09-17 MED ORDER — LISINOPRIL 10 MG PO TABS: 10.0000 mg | ORAL_TABLET | Freq: Every day | ORAL | 0 refills | Status: DC

## 2019-09-17 MED ORDER — PIOGLITAZONE HCL 15 MG PO TABS: ORAL_TABLET | ORAL | 0 refills | Status: DC

## 2019-09-17 MED ORDER — TRULICITY 1.5 MG/0.5ML ~~LOC~~ SOAJ: SUBCUTANEOUS | 0 refills | Status: DC

## 2019-09-17 NOTE — Patient Instructions (Signed)
Follow up in 6 weeks for fasting labs  Low triglyceride low cholesterol diet.  Meal prep sets you up for success  Increase physical activity

## 2019-09-17 NOTE — Progress Notes (Signed)
Careteam: Patient Care Team: [name], NP as PCP - General (Nurse Practitioner) [name], OD as Consulting Physician (Optometry) [name], MD as Consulting Physician (Urology)  PLACE OF SERVICE:  [facility]  Advanced Directive information Does Patient Have a Medical Advance Directive?: No, Would patient like information on creating a medical advance directive?: Yes (MAU/Ambulatory/Procedural Areas - Information given)(paperwork given at previous appointment)  Allergies  Allergen Reactions  . Hydrocodone Nausea And Vomiting  . Oxycodone Nausea And Vomiting    Chief Complaint  Patient presents with  . Medical Management of Chronic Issues    3 month follow-up and discuss labs (copy printed)   . Medication Management    Renew all medications for 90 day supply vs 30 days at a time      HPI: Patient is a 59 y.o. male for routine follow up.   DM- currently on trulicity, jardiance, metformin and actos, a1c 6.9. Reports he has been stress eating.  Has a new job.  New job he had 11K steps.  No visual changes Some numbness in feet but does not have appropriate shoes but getting them.   Hyperlipidemia- elevated triglycerides LDL not at goal at 92   Needs to get back in the gym.       Review of Systems:  Review of Systems  Constitutional: Negative for chills, fever and weight loss.  HENT: Negative for tinnitus.   Respiratory: Negative for cough, sputum production and shortness of breath.   Cardiovascular: Negative for chest pain, palpitations and leg swelling.  Gastrointestinal: Negative for abdominal pain, constipation, diarrhea and heartburn.  Genitourinary: Negative for dysuria, frequency and urgency.  Musculoskeletal: Negative for back pain, joint pain and myalgias.  Skin: Negative.   Neurological: Negative for dizziness and headaches.  Psychiatric/Behavioral: Negative for depression and memory loss. The patient does not have insomnia.     Past  Medical History:  Diagnosis Date  . Fatty liver    followed by dr [name]--- mild elevated LFT since 2015,  blood work-up done 05/ 2020 (epic), and abd. ultrasound 10-16-2018 (epic),  recommendation loss weight  . History of kidney stones   . Hyperlipidemia   . Hypertension   . Seasonal allergies   . Type 2 diabetes mellitus (HCC)    followed by pcp--  fasting cbg-- 140;  had been checking blood surgar past few weeks  . Ureteral calculi    right  . Wears contact lenses    Past Surgical History:  Procedure Laterality Date  . COLONOSCOPY WITH PROPOFOL  03-13-2015  dr [name]  . CYSTOSCOPY WITH HOLMIUM LASER LITHOTRIPSY  12-23-2003   dr Vernie Ammons  @[facility]   . CYSTOSCOPY/RETROGRADE/URETEROSCOPY/STONE EXTRACTION WITH BASKET Right 03/26/2019   Procedure: CYSTOSCOPY/RETROGRADE/URETEROSCOPY/STONE EXTRACTION WITH BASKET/ STENT PLACEMENT;  Surgeon: 03/28/2019, MD;  Location[facility];  Service: Urology;  Laterality: Right;  . EXTRACORPOREAL SHOCK WAVE LITHOTRIPSY  11-05-2012   @[facility]   . TONSILLECTOMY  child   Social History:   reports that he quit smoking about 29 years ago. His smoking use included cigarettes. He has a 1.50 pack-year smoking history. He has never used smokeless tobacco. He reports current alcohol use of about 4.0 standard drinks of alcohol per week. He reports that he does not use drugs.  Family History  Problem Relation Age of Onset  . Cancer Mother   . Colon cancer Neg Hx   . Rectal cancer Neg Hx   . Stomach cancer Neg Hx   . Esophageal  cancer Neg Hx     Medications: Patient's Medications  New Prescriptions   No medications on file  Previous Medications   ASPIRIN 81 MG TABLET    Take 81 mg by mouth daily.   ATORVASTATIN (LIPITOR) 10 MG TABLET    TAKE 1 TABLET BY MOUTH EVERY DAY   CETIRIZINE (ZYRTEC) 10 MG TABLET    Take 10 mg by mouth daily.   DULAGLUTIDE (TRULICITY) 1.5 ID/7.8EU SOPN    Inject 1.5mg  into the skin once a week.   EMPAGLIFLOZIN  (JARDIANCE) 25 MG TABS TABLET    TAKE 1 TABLET BY MOUTH EVERY DAY   FLUTICASONE (FLONASE) 50 MCG/ACT NASAL SPRAY    Place 1 spray into both nostrils as needed for allergies or rhinitis.   GLUCOSE BLOOD (ONETOUCH VERIO) TEST STRIP    Use to test blood sugar twice daily Dx: E11.69   IBUPROFEN (ADVIL) 200 MG TABLET    Take 200 mg by mouth every 6 (six) hours as needed.   LANCET DEVICES (ONE TOUCH DELICA LANCING DEV) MISC    Check blood sugar 2 x daily as directed DX: 250.02   LISINOPRIL (ZESTRIL) 10 MG TABLET    Take 1 tablet (10 mg total) by mouth daily.   METFORMIN (GLUCOPHAGE) 1000 MG TABLET    TAKE 1 TABLET BY MOUTH 2 TIMES DAILY WITH A MEAL. PLEASE CALL [facility] OFFICE NEED FOLLOW UP APPOINTMENT BEFORE MORE REFILLS.   NAPROXEN SODIUM (ALEVE) 220 MG TABLET    Take 220 mg by mouth as needed.   PIOGLITAZONE (ACTOS) 15 MG TABLET    TAKE 1 TABLET BY MOUTH DAILY   POTASSIUM CITRATE (UROCIT-K) 10 MEQ (1080 MG) SR TABLET    at bedtime. 2 by mouth once daily to prevent kidney stones   Modified Medications   No medications on file  Discontinued Medications   No medications on file    Physical Exam:  Vitals:   09/17/19 0839  BP: 134/83  Pulse: 70  Temp: (!) 96.9 F (36.1 C)  TempSrc: Temporal  SpO2: 97%  Weight: 238 lb 9.6 oz (108.2 kg)  Height: 5\' 8"  (1.727 m)   Body mass index is 36.28 kg/m. Wt Readings from Last 3 Encounters:  09/17/19 238 lb 9.6 oz (108.2 kg)  06/11/19 235 lb (106.6 kg)  03/26/19 229 lb 1 oz (103.9 kg)    Physical Exam Constitutional:      General: He is not in acute distress.    Appearance: He is well-developed. He is not diaphoretic.  HENT:     Head: Normocephalic and atraumatic.     Mouth/Throat:     Pharynx: No oropharyngeal exudate.  Eyes:     Conjunctiva/sclera: Conjunctivae normal.     Pupils: Pupils are equal, round, and reactive to light.  Cardiovascular:     Rate and Rhythm: Normal rate and regular rhythm.     Heart sounds: Normal heart sounds.    Pulmonary:     Effort: Pulmonary effort is normal.     Breath sounds: Normal breath sounds.  Abdominal:     General: Abdomen is protuberant. Bowel sounds are normal.     Palpations: Abdomen is soft.  Musculoskeletal:        General: No tenderness.     Cervical back: Normal range of motion and neck supple.  Skin:    General: Skin is warm and dry.  Neurological:     Mental Status: He is alert and oriented to person, place, and time.  Labs reviewed: Basic Metabolic Panel: Recent Labs    10/14/18 1113 10/14/18 1113 03/26/19 0624 06/11/19 1001 09/10/19 0812  NA 136   < > 135 138 138  K 4.3   < > 4.2 4.5 4.4  CL 99   < > 99 102 101  CO2 23  --   --  23 23  GLUCOSE 211*   < > 159* 123* 131*  BUN 17   < > 24* 17 20  CREATININE 0.88   < > 1.20 0.96 0.91  CALCIUM 10.1  --   --  9.9 9.6   < > = values in this interval not displayed.   Liver Function Tests: Recent Labs    10/14/18 1113 06/11/19 1001 09/10/19 0812  AST 41* 58* 45*  ALT 56* 57* 52*  ALKPHOS 87  --   --   BILITOT 1.2 1.0 1.0  PROT 8.0 7.5 7.4  ALBUMIN 4.8  --   --    No results for input(s): LIPASE, AMYLASE in the last 8760 hours. No results for input(s): AMMONIA in the last 8760 hours. CBC: Recent Labs    09/22/18 0810 09/22/18 0810 10/14/18 1113 03/26/19 0624 06/11/19 1001  WBC 8.1  --  10.1  --  8.6  NEUTROABS 5,168  --  7.0  --  5,134  HGB 15.7   < > 17.3* 16.7 15.5  HCT 45.9   < > 50.4 49.0 45.4  MCV 89.8  --  90.5  --  89.2  PLT 249  --  252.0  --  280   < > = values in this interval not displayed.   Lipid Panel: Recent Labs    09/22/18 0810 06/11/19 1001 09/10/19 0812  CHOL 162 151 161  HDL 32* 35* 34*  LDLCALC 89 87 92  TRIG 284* 194* 263*  CHOLHDL 5.1* 4.3 4.7   TSH: No results for input(s): TSH in the last 8760 hours. A1C: Lab Results  Component Value Date   HGBA1C 6.9 (H) 09/10/2019     Assessment/Plan 1. Hyperlipidemia LDL goal <70 -triglycerides not at goal.  Discussed diet in detail today. Will increase lipitor at this time but may need to add additional medication to lower triglycerides. Will follow up lipid in 6 weeks  - atorvastatin (LIPITOR) 20 MG tablet; TAKE 1 TABLET BY MOUTH EVERY DAY  Dispense: 90 tablet; Refill: 1  2. Diabetes mellitus type 2 in obese (HCC) -controlled on recent a1c but continues with elevated fasting glucose.  -Encouraged dietary compliance, routine foot care/monitoring and to keep up with diabetic eye exams through ophthalmology  -- discussed with the patient the pathophysiology of diabetes and the natural progression of the disease.  -stressed the importance of lifestyle changes including diet and exercise. -discussed complications associated with diabetes including retinopathy, nephropathy, neuropathy as well as increased risk of cardiovascular disease. We went over the benefit seen with glycemic control.  - pioglitazone (ACTOS) 15 MG tablet; TAKE 1 TABLET BY MOUTH DAILY  Dispense: 90 tablet; Refill: 0 - metFORMIN (GLUCOPHAGE) 1000 MG tablet; TAKE 1 TABLET BY MOUTH 2 TIMES DAILY WITH A MEAL.  Dispense: 180 tablet; Refill: 0 - empagliflozin (JARDIANCE) 25 MG TABS tablet; TAKE 1 TABLET BY MOUTH EVERY DAY  Dispense: 90 tablet; Refill: 0 - Dulaglutide (TRULICITY) 1.5 MG/0.5ML SOPN; Inject 1.5mg  into the skin once a week.  Dispense: 12 pen; Refill: 0  3. Class 2 severe obesity due to excess calories with serious comorbidity and body mass index (BMI)  of 36.0 to 36.9 in adult [facility]) -encouraged lifestyle modifications for weight loss, meal planning, heart healthy diabetic diet. Encouraged increase in physical activity as well   4. Elevated liver enzymes -discussed importance of low fat diet. - atorvastatin (LIPITOR) 20 MG tablet; TAKE 1 TABLET BY MOUTH EVERY DAY  Dispense: 90 tablet; Refill: 1 - COMPLETE METABOLIC PANEL WITH GFR; Future - Lipid panel; Future  5. Essential hypertension -stable on current regimen to continue  dietary modifications.  - lisinopril (ZESTRIL) 10 MG tablet; Take 1 tablet (10 mg total) by mouth daily.  Dispense: 90 tablet; Refill: 0  Next appt: 6 weeks for fasting labs, 4 months for routine follow up.  Janene Harvey. Biagio Borg  [facility] [phone]

## 2019-10-29 ENCOUNTER — Other Ambulatory Visit: Payer: Managed Care, Other (non HMO)

## 2019-10-29 ENCOUNTER — Other Ambulatory Visit: Payer: Self-pay

## 2019-10-29 DIAGNOSIS — R748 Abnormal levels of other serum enzymes: Secondary | ICD-10-CM

## 2019-10-30 LAB — COMPLETE METABOLIC PANEL WITH GFR
AG Ratio: 1.8 (calc) (ref 1.0–2.5)
ALT: 38 U/L (ref 9–46)
AST: 31 U/L (ref 10–35)
Albumin: 4.6 g/dL (ref 3.6–5.1)
Alkaline phosphatase (APISO): 78 U/L (ref 35–144)
BUN: 21 mg/dL (ref 7–25)
CO2: 26 mmol/L (ref 20–32)
Calcium: 9.9 mg/dL (ref 8.6–10.3)
Chloride: 102 mmol/L (ref 98–110)
Creat: 1.06 mg/dL (ref 0.70–1.33)
GFR, Est African American: 89 mL/min/{1.73_m2} (ref >=60)
GFR, Est Non African American: 77 mL/min/{1.73_m2} (ref >=60)
Globulin: 2.6 g/dL (calc) (ref 1.9–3.7)
Glucose, Bld: 139 mg/dL — ABNORMAL HIGH (ref 65–99)
Potassium: 4.4 mmol/L (ref 3.5–5.3)
Sodium: 139 mmol/L (ref 135–146)
Total Bilirubin: 0.9 mg/dL (ref 0.2–1.2)
Total Protein: 7.2 g/dL (ref 6.1–8.1)

## 2019-10-30 LAB — LIPID PANEL
Cholesterol: 131 mg/dL (ref <200)
HDL: 36 mg/dL — ABNORMAL LOW (ref >=40)
LDL Cholesterol (Calc): 70 mg/dL (calc)
Non-HDL Cholesterol (Calc): 95 mg/dL (calc) (ref <130)
Total CHOL/HDL Ratio: 3.6 (calc) (ref <5.0)
Triglycerides: 173 mg/dL — ABNORMAL HIGH (ref <150)

## 2019-11-04 ENCOUNTER — Other Ambulatory Visit: Payer: Self-pay

## 2019-11-04 DIAGNOSIS — E785 Hyperlipidemia, unspecified: Secondary | ICD-10-CM

## 2019-11-04 DIAGNOSIS — R748 Abnormal levels of other serum enzymes: Secondary | ICD-10-CM

## 2019-11-04 DIAGNOSIS — I10 Essential (primary) hypertension: Secondary | ICD-10-CM

## 2019-11-04 DIAGNOSIS — E1169 Type 2 diabetes mellitus with other specified complication: Secondary | ICD-10-CM

## 2019-12-07 ENCOUNTER — Other Ambulatory Visit: Payer: Self-pay | Admitting: Nurse Practitioner

## 2019-12-07 DIAGNOSIS — E1169 Type 2 diabetes mellitus with other specified complication: Secondary | ICD-10-CM

## 2019-12-17 ENCOUNTER — Other Ambulatory Visit: Payer: Managed Care, Other (non HMO)

## 2019-12-17 ENCOUNTER — Other Ambulatory Visit: Payer: Self-pay

## 2019-12-17 DIAGNOSIS — E785 Hyperlipidemia, unspecified: Secondary | ICD-10-CM

## 2019-12-17 DIAGNOSIS — Z6836 Body mass index (BMI) 36.0-36.9, adult: Secondary | ICD-10-CM

## 2019-12-17 DIAGNOSIS — E669 Obesity, unspecified: Secondary | ICD-10-CM

## 2019-12-17 DIAGNOSIS — R748 Abnormal levels of other serum enzymes: Secondary | ICD-10-CM

## 2019-12-17 DIAGNOSIS — I10 Essential (primary) hypertension: Secondary | ICD-10-CM

## 2019-12-18 LAB — CBC WITH DIFFERENTIAL/PLATELET
Absolute Monocytes: 473 cells/uL (ref 200–950)
Basophils Absolute: 68 cells/uL (ref 0–200)
Basophils Relative: 0.9 %
Eosinophils Absolute: 218 cells/uL (ref 15–500)
Eosinophils Relative: 2.9 %
HCT: 43.1 % (ref 38.5–50.0)
Hemoglobin: 14.4 g/dL (ref 13.2–17.1)
Lymphs Abs: 2025 cells/uL (ref 850–3900)
MCH: 30.2 pg (ref 27.0–33.0)
MCHC: 33.4 g/dL (ref 32.0–36.0)
MCV: 90.4 fL (ref 80.0–100.0)
MPV: 9.5 fL (ref 7.5–12.5)
Monocytes Relative: 6.3 %
Neutro Abs: 4718 cells/uL (ref 1500–7800)
Neutrophils Relative %: 62.9 %
Platelets: 216 10*3/uL (ref 140–400)
RBC: 4.77 10*6/uL (ref 4.20–5.80)
RDW: 12.2 % (ref 11.0–15.0)
Total Lymphocyte: 27 %
WBC: 7.5 10*3/uL (ref 3.8–10.8)

## 2019-12-18 LAB — HEMOGLOBIN A1C
Hgb A1c MFr Bld: 6.7 % of total Hgb — ABNORMAL HIGH (ref <5.7)
Mean Plasma Glucose: 146 (calc)
eAG (mmol/L): 8.1 (calc)

## 2019-12-18 LAB — COMPLETE METABOLIC PANEL WITH GFR
AG Ratio: 1.9 (calc) (ref 1.0–2.5)
ALT: 33 U/L (ref 9–46)
AST: 30 U/L (ref 10–35)
Albumin: 4.4 g/dL (ref 3.6–5.1)
Alkaline phosphatase (APISO): 69 U/L (ref 35–144)
BUN: 16 mg/dL (ref 7–25)
CO2: 23 mmol/L (ref 20–32)
Calcium: 9.4 mg/dL (ref 8.6–10.3)
Chloride: 104 mmol/L (ref 98–110)
Creat: 0.87 mg/dL (ref 0.70–1.33)
GFR, Est African American: 110 mL/min/{1.73_m2} (ref >=60)
GFR, Est Non African American: 95 mL/min/{1.73_m2} (ref >=60)
Globulin: 2.3 g/dL (calc) (ref 1.9–3.7)
Glucose, Bld: 115 mg/dL — ABNORMAL HIGH (ref 65–99)
Potassium: 4.3 mmol/L (ref 3.5–5.3)
Sodium: 140 mmol/L (ref 135–146)
Total Bilirubin: 0.9 mg/dL (ref 0.2–1.2)
Total Protein: 6.7 g/dL (ref 6.1–8.1)

## 2019-12-18 LAB — LIPID PANEL
Cholesterol: 138 mg/dL (ref <200)
HDL: 35 mg/dL — ABNORMAL LOW (ref >=40)
LDL Cholesterol (Calc): 73 mg/dL (calc)
Non-HDL Cholesterol (Calc): 103 mg/dL (calc) (ref <130)
Total CHOL/HDL Ratio: 3.9 (calc) (ref <5.0)
Triglycerides: 198 mg/dL — ABNORMAL HIGH (ref <150)

## 2019-12-24 ENCOUNTER — Other Ambulatory Visit: Payer: Self-pay

## 2019-12-24 ENCOUNTER — Ambulatory Visit (INDEPENDENT_AMBULATORY_CARE_PROVIDER_SITE_OTHER): Payer: Managed Care, Other (non HMO) | Admitting: Nurse Practitioner

## 2019-12-24 ENCOUNTER — Encounter: Payer: Self-pay | Admitting: Nurse Practitioner

## 2019-12-24 VITALS — BP 124/82 | HR 85 | Temp 96.8°F | Ht 69.0 in | Wt 234.0 lb

## 2019-12-24 DIAGNOSIS — E1169 Type 2 diabetes mellitus with other specified complication: Secondary | ICD-10-CM | POA: Diagnosis not present

## 2019-12-24 DIAGNOSIS — Z6836 Body mass index (BMI) 36.0-36.9, adult: Secondary | ICD-10-CM

## 2019-12-24 DIAGNOSIS — E119 Type 2 diabetes mellitus without complications: Secondary | ICD-10-CM

## 2019-12-24 DIAGNOSIS — R748 Abnormal levels of other serum enzymes: Secondary | ICD-10-CM

## 2019-12-24 DIAGNOSIS — Z0000 Encounter for general adult medical examination without abnormal findings: Secondary | ICD-10-CM

## 2019-12-24 DIAGNOSIS — E785 Hyperlipidemia, unspecified: Secondary | ICD-10-CM | POA: Diagnosis not present

## 2019-12-24 DIAGNOSIS — E669 Obesity, unspecified: Secondary | ICD-10-CM

## 2019-12-24 DIAGNOSIS — E66812 Obesity, class 2: Secondary | ICD-10-CM

## 2019-12-24 DIAGNOSIS — I10 Essential (primary) hypertension: Secondary | ICD-10-CM

## 2019-12-24 NOTE — Progress Notes (Signed)
Provider: Sharon SellerEubanks, Clytie Shetley K, NP  Patient Care Team: Sharon SellerEubanks, Gurfateh Mcclain K, NP as PCP - General (Nurse Practitioner) Estrella DeedsYoakum, John, OD as Consulting Physician (Optometry) Ihor Gullyttelin, Mark, MD as Consulting Physician (Urology)  Extended Emergency Contact Information Primary Emergency Contact: terry,pam          [place] Home Phone: [phone] Relation: Significant other Allergies  Allergen Reactions   Hydrocodone Nausea And Vomiting   Oxycodone Nausea And Vomiting   Code Status: FULL Goals of Care: Advanced Directive information Advanced Directives 09/17/2019  Does Patient Have a Medical Advance Directive? No  Type of Advance Directive -  Does patient want to make changes to medical advance directive? -  Copy of Healthcare Power of Attorney in Chart? -  Would patient like information on creating a medical advance directive? Yes (MAU/Ambulatory/Procedural Areas - Information given)     Chief Complaint  Patient presents with   Annual Exam    Yearly physical and discuss labs (copy printed)     HPI: Patient is a 59 y.o. male [facility] Reports he is now getting 6500 on avg a day at work with his new job.   Diet?more water, decrease in portion.   Exercise?plans to start going to the gym    Dentition:routine follow up, every 6 months  Ophthalmology appt: yearly  Routine specialist: urologist due to kidney stones.   [facility] 06/11/2019 03/30/2018 03/18/2017 02/05/2017 01/20/2017  Decreased Interest 0 0 0 0 0  Down, Depressed, Hopeless 0 0 0 0 0  PHQ - 2 Score 0 0 0 0 0    Fall Risk  09/17/2019 06/11/2019 03/30/2018 10/28/2017 07/25/2017  Falls in the past year? 0 0 No No No  Number falls in past yr: 0 0 - - -  Injury with Fall? 0 0 - - -   No flowsheet data found.   Health Maintenance  Topic Date Due   INFLUENZA VACCINE  01/02/2020   FOOT EXAM  06/10/2020   HEMOGLOBIN A1C  06/18/2020    OPHTHALMOLOGY EXAM  07/08/2020   COLONOSCOPY  03/12/2025   TETANUS/TDAP  07/15/2029   PNEUMOCOCCAL POLYSACCHARIDE VACCINE AGE 71-64 HIGH RISK  Completed   COVID-19 Vaccine  Completed   Hepatitis C Screening  Completed   HIV Screening  Completed    Past Medical History:  Diagnosis Date   Fatty liver    followed by dr Christella Hartiganjacobs--- mild elevated LFT since 2015,  blood work-up done 05/ 2020 (epic), and abd. ultrasound 10-16-2018 (epic),  recommendation loss weight   History of kidney stones    Hyperlipidemia    Hypertension    Seasonal allergies    Type 2 diabetes mellitus (HCC)    followed by pcp--  fasting cbg-- 140;  had been checking blood surgar past few weeks   Ureteral calculi    right   Wears contact lenses     Past Surgical History:  Procedure Laterality Date   COLONOSCOPY WITH PROPOFOL  03-13-2015  dr Christella Hartiganjacobs   CYSTOSCOPY WITH HOLMIUM LASER LITHOTRIPSY  12-23-2003   dr Vernie Ammonsottelin  @[facility]    CYSTOSCOPY/RETROGRADE/URETEROSCOPY/STONE EXTRACTION WITH BASKET Right 03/26/2019   Procedure: CYSTOSCOPY/RETROGRADE/URETEROSCOPY/STONE EXTRACTION WITH BASKET/ STENT PLACEMENT;  Surgeon: Ihor Gullyttelin, Mark, MD;  [facility] [place];  Service: Urology;  Laterality: Right;   EXTRACORPOREAL SHOCK WAVE LITHOTRIPSY  11-05-2012   @[facility]    TONSILLECTOMY  child    Social History   Socioeconomic History  Marital status: Single    Spouse name: Not on file   Number of children: Not on file   Years of education: Not on file   Highest education level: Not on file  Occupational History   Not on file  Tobacco Use   Smoking status: Former Smoker    Packs/day: 0.50    Years: 3.00    Pack years: 1.50    Types: Cigarettes    Quit date: 06/03/1990    Years since quitting: 29.5   Smokeless tobacco: Never Used  Vaping Use   Vaping Use: Never used  Substance and Sexual Activity   Alcohol use: Yes    Alcohol/week: 4.0 standard drinks    Types: 4 Standard drinks or  equivalent per week    Comment: Beer off/on    Drug use: Never   Sexual activity: Not on file  Other Topics Concern   Not on file  Social History Narrative   Not on file   Social Determinants of Health   Financial Resource Strain:    Difficulty of Paying Living Expenses:   Food Insecurity:    Worried About Programme researcher, broadcasting/film/video in the Last Year:    Barista in the Last Year:   Transportation Needs:    Freight forwarder (Medical):    Lack of Transportation (Non-Medical):   Physical Activity:    Days of Exercise per Week:    Minutes of Exercise per Session:   Stress:    Feeling of Stress :   Social Connections:    Frequency of Communication with Friends and Family:    Frequency of Social Gatherings with Friends and Family:    Attends Religious Services:    Active Member of Clubs or Organizations:    Attends Engineer, structural:    Marital Status:     Family History  Problem Relation Age of Onset   Cancer Mother    Colon cancer Neg Hx    Rectal cancer Neg Hx    Stomach cancer Neg Hx    Esophageal cancer Neg Hx     Review of Systems:  Review of Systems  Constitutional: Negative for chills, fever and weight loss.  HENT: Positive for tinnitus.   Respiratory: Negative for cough, sputum production and shortness of breath.   Cardiovascular: Negative for chest pain, palpitations and leg swelling.  Gastrointestinal: Negative for abdominal pain, constipation, diarrhea and heartburn.  Genitourinary: Negative for dysuria, frequency and urgency.  Musculoskeletal: Negative for back pain, falls, joint pain and myalgias.  Skin: Negative.   Neurological: Negative for dizziness and headaches.  Psychiatric/Behavioral: Negative for depression and memory loss. The patient does not have insomnia.     Allergies as of 12/24/2019      Reactions   Hydrocodone Nausea And Vomiting   Oxycodone Nausea And Vomiting      Medication List        Accurate as of December 24, 2019  1:40 PM. If you have any questions, ask your nurse or doctor.        aspirin 81 MG tablet Take 81 mg by mouth daily.   atorvastatin 20 MG tablet Commonly known as: LIPITOR TAKE 1 TABLET BY MOUTH EVERY DAY   cetirizine 10 MG tablet Commonly known as: ZYRTEC Take 10 mg by mouth daily.   fluticasone 50 MCG/ACT nasal spray Commonly known as: FLONASE Place 1 spray into both nostrils as needed for allergies or rhinitis.   glucose blood test strip Commonly  known as: Chartered loss adjuster Use to test blood sugar twice daily Dx: E11.69   ibuprofen 200 MG tablet Commonly known as: ADVIL Take 200 mg by mouth every 6 (six) hours as needed.   Jardiance 25 MG Tabs tablet Generic drug: empagliflozin TAKE 1 TABLET BY MOUTH EVERY DAY   lisinopril 10 MG tablet Commonly known as: ZESTRIL Take 1 tablet (10 mg total) by mouth daily.   metFORMIN 1000 MG tablet Commonly known as: GLUCOPHAGE TAKE 1 TABLET BY MOUTH 2 TIMES DAILY WITH A MEAL.   naproxen sodium 220 MG tablet Commonly known as: ALEVE Take 220 mg by mouth as needed.   ONE TOUCH DELICA LANCING DEV Misc Check blood sugar 2 x daily as directed DX: 250.02   pioglitazone 15 MG tablet Commonly known as: ACTOS TAKE 1 TABLET BY MOUTH EVERY DAY   potassium citrate 10 MEQ (1080 MG) SR tablet Commonly known as: UROCIT-K at bedtime. 2 by mouth once daily to prevent kidney stones   Trulicity 1.5 MG/0.5ML Sopn Generic drug: Dulaglutide Inject 1.5mg  into the skin once a week.         Physical Exam: Vitals:   12/24/19 1334  BP: 124/82  Pulse: 85  Temp: (!) 96.8 F (36 C)  TempSrc: Temporal  SpO2: 96%  Weight: (!) 234 lb (106.1 kg)  Height: 5\' 9"  (1.753 m)   Body mass index is 34.56 kg/m. Wt Readings from Last 3 Encounters:  12/24/19 (!) 234 lb (106.1 kg)  09/17/19 238 lb 9.6 oz (108.2 kg)  06/11/19 235 lb (106.6 kg)    Physical Exam Constitutional:      General: He is not in acute  distress.    Appearance: He is well-developed. He is not diaphoretic.  HENT:     Head: Normocephalic and atraumatic.     Right Ear: Tympanic membrane and ear canal normal.     Left Ear: Tympanic membrane and ear canal normal.     Mouth/Throat:     Pharynx: No oropharyngeal exudate.  Eyes:     Conjunctiva/sclera: Conjunctivae normal.     Pupils: Pupils are equal, round, and reactive to light.  Cardiovascular:     Rate and Rhythm: Normal rate and regular rhythm.     Heart sounds: Normal heart sounds.  Pulmonary:     Effort: Pulmonary effort is normal.     Breath sounds: Normal breath sounds.  Abdominal:     General: Bowel sounds are normal.     Palpations: Abdomen is soft.  Musculoskeletal:        General: No tenderness.     Cervical back: Normal range of motion and neck supple.     Right lower leg: No edema.     Left lower leg: No edema.  Skin:    General: Skin is warm and dry.  Neurological:     Mental Status: He is alert and oriented to person, place, and time.  Psychiatric:        Mood and Affect: Mood normal.        Behavior: Behavior normal.     Labs reviewed: Basic Metabolic Panel: Recent Labs    09/10/19 0812 10/29/19 0819 12/17/19 0826  NA 138 139 140  K 4.4 4.4 4.3  CL 101 102 104  CO2 23 26 23   GLUCOSE 131* 139* 115*  BUN 20 21 16   CREATININE 0.91 1.06 0.87  CALCIUM 9.6 9.9 9.4   Liver Function Tests: Recent Labs    09/10/19 0812 10/29/19 0819 12/17/19  0826  AST 45* 31 30  ALT 52* 38 33  BILITOT 1.0 0.9 0.9  PROT 7.4 7.2 6.7   No results for input(s): LIPASE, AMYLASE in the last 8760 hours. No results for input(s): AMMONIA in the last 8760 hours. CBC: Recent Labs    03/26/19 0624 06/11/19 1001 12/17/19 0826  WBC  --  8.6 7.5  NEUTROABS  --  5,134 4,718  HGB 16.7 15.5 14.4  HCT 49.0 45.4 43.1  MCV  --  89.2 90.4  PLT  --  280 216   Lipid Panel: Recent Labs    09/10/19 0812 10/29/19 0819 12/17/19 0826  CHOL 161 131 138  HDL  34* 36* 35*  LDLCALC 92 70 73  TRIG 263* 173* 198*  CHOLHDL 4.7 3.6 3.9   Lab Results  Component Value Date   HGBA1C 6.7 (H) 12/17/2019    Procedures: No results found.  Assessment/Plan 1. Hyperlipidemia LDL goal <70 LDL 73, continues to work on diet and exercise. To continue Lipitor 20 mg daily.  - Lipid Panel; Future - COMPLETE METABOLIC PANEL WITH GFR; Future  2. Diabetes mellitus type 2 in obese (HCC) -A1c at goal on actos, jardiance, trulicity and metformin. Encouraged dietary compliance, routine foot care/monitoring and to keep up with diabetic eye exams through ophthalmology  - Hemoglobin A1c; Future  3. Class 2 severe obesity due to excess calories with serious comorbidity and body mass index (BMI) of 34 in adult [facility]) Ongoing, has lost weight. discussed healthy lifestyle with continue dietary modifications and to increase physical activity.   4. Elevated liver enzymes Has improved, continue low fat diet.   5. Essential hypertension Stable on lisinopril.  - COMPLETE METABOLIC PANEL WITH GFR; Future - CBC with Differential/Platelet; Future  6. Wellness examination Done today, he is doing well today without acute findings today. Discussed preventative healthcare and information provided.   Next appt: 6 months with labs prior  Jailyne Chieffo K. Biagio Borg  [facility] [phone]

## 2019-12-24 NOTE — Patient Instructions (Signed)

## 2020-01-06 IMAGING — DX DG ABDOMEN 1V
2 series · 2 of 2 positions shown · non-contrast
Comparison: 03/02/2019.  CT, 10/29/2012.

CLINICAL DATA: Preop for right ureteral stone extraction.

EXAM:
ABDOMEN - 1 VIEW

[abdomen kub (1 of 2)]
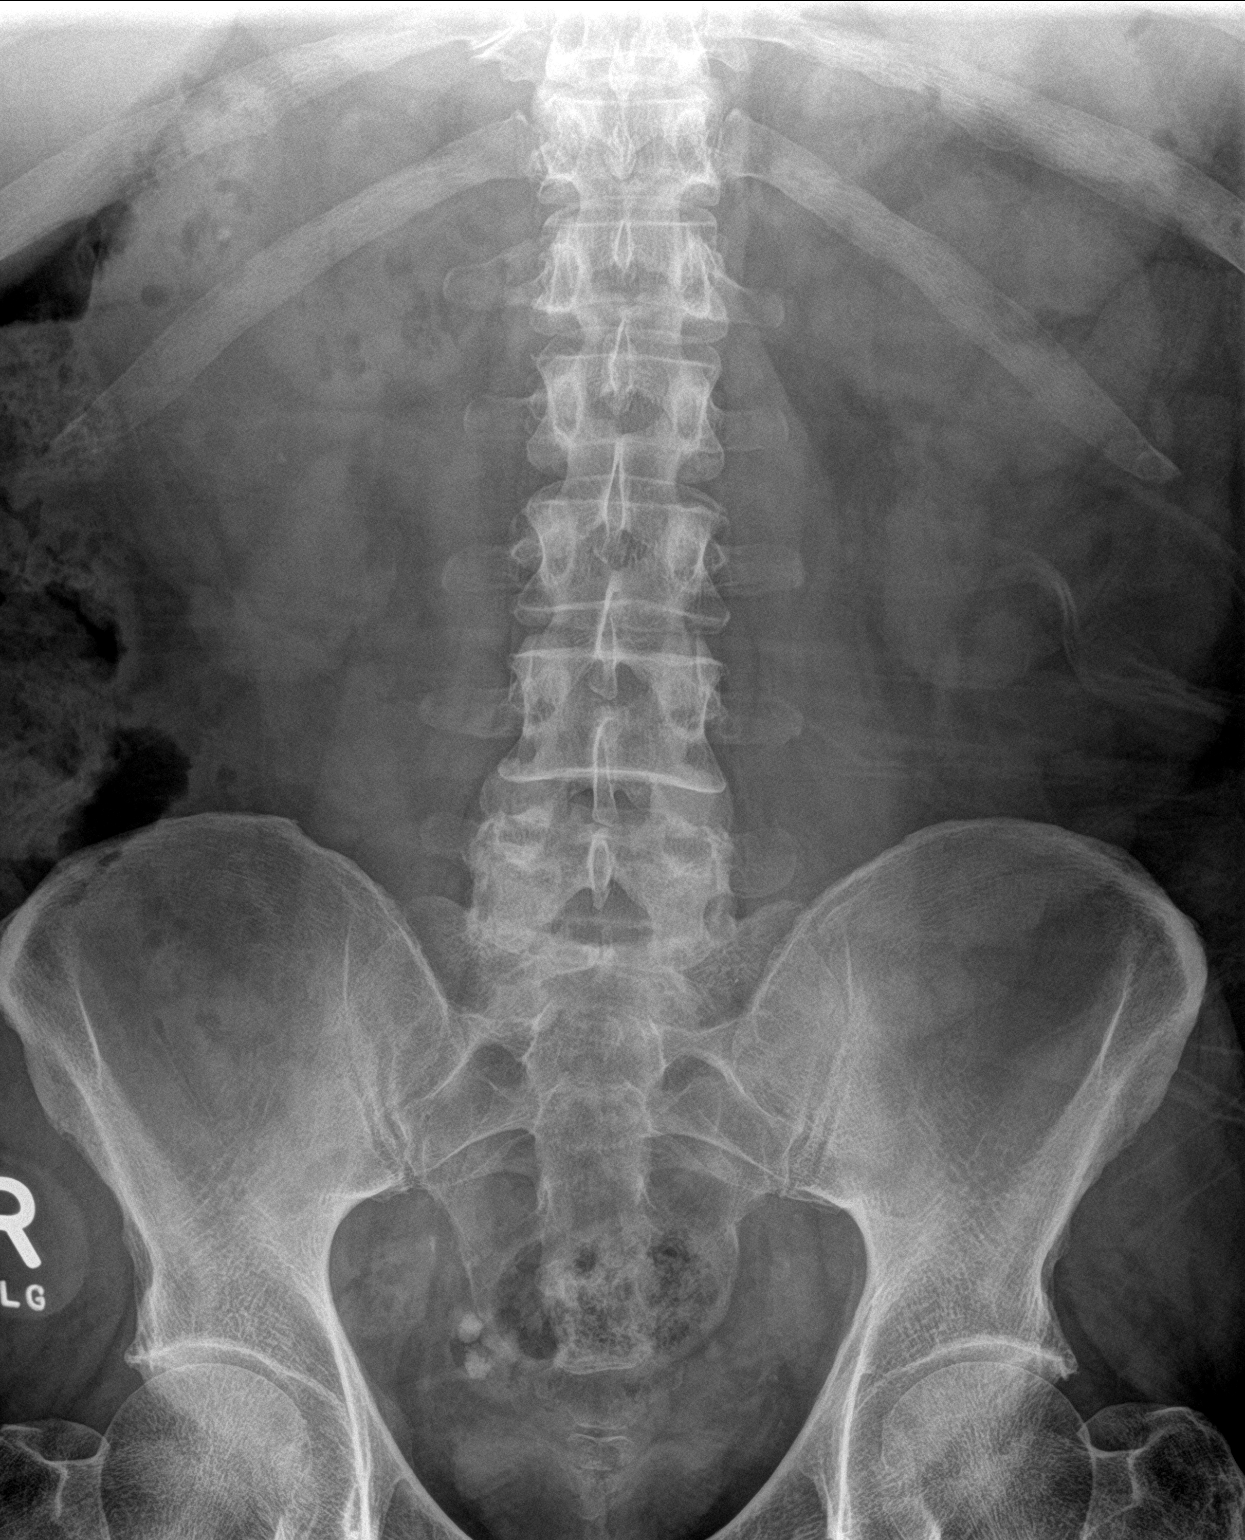

[abdomen kub (2 of 2)]
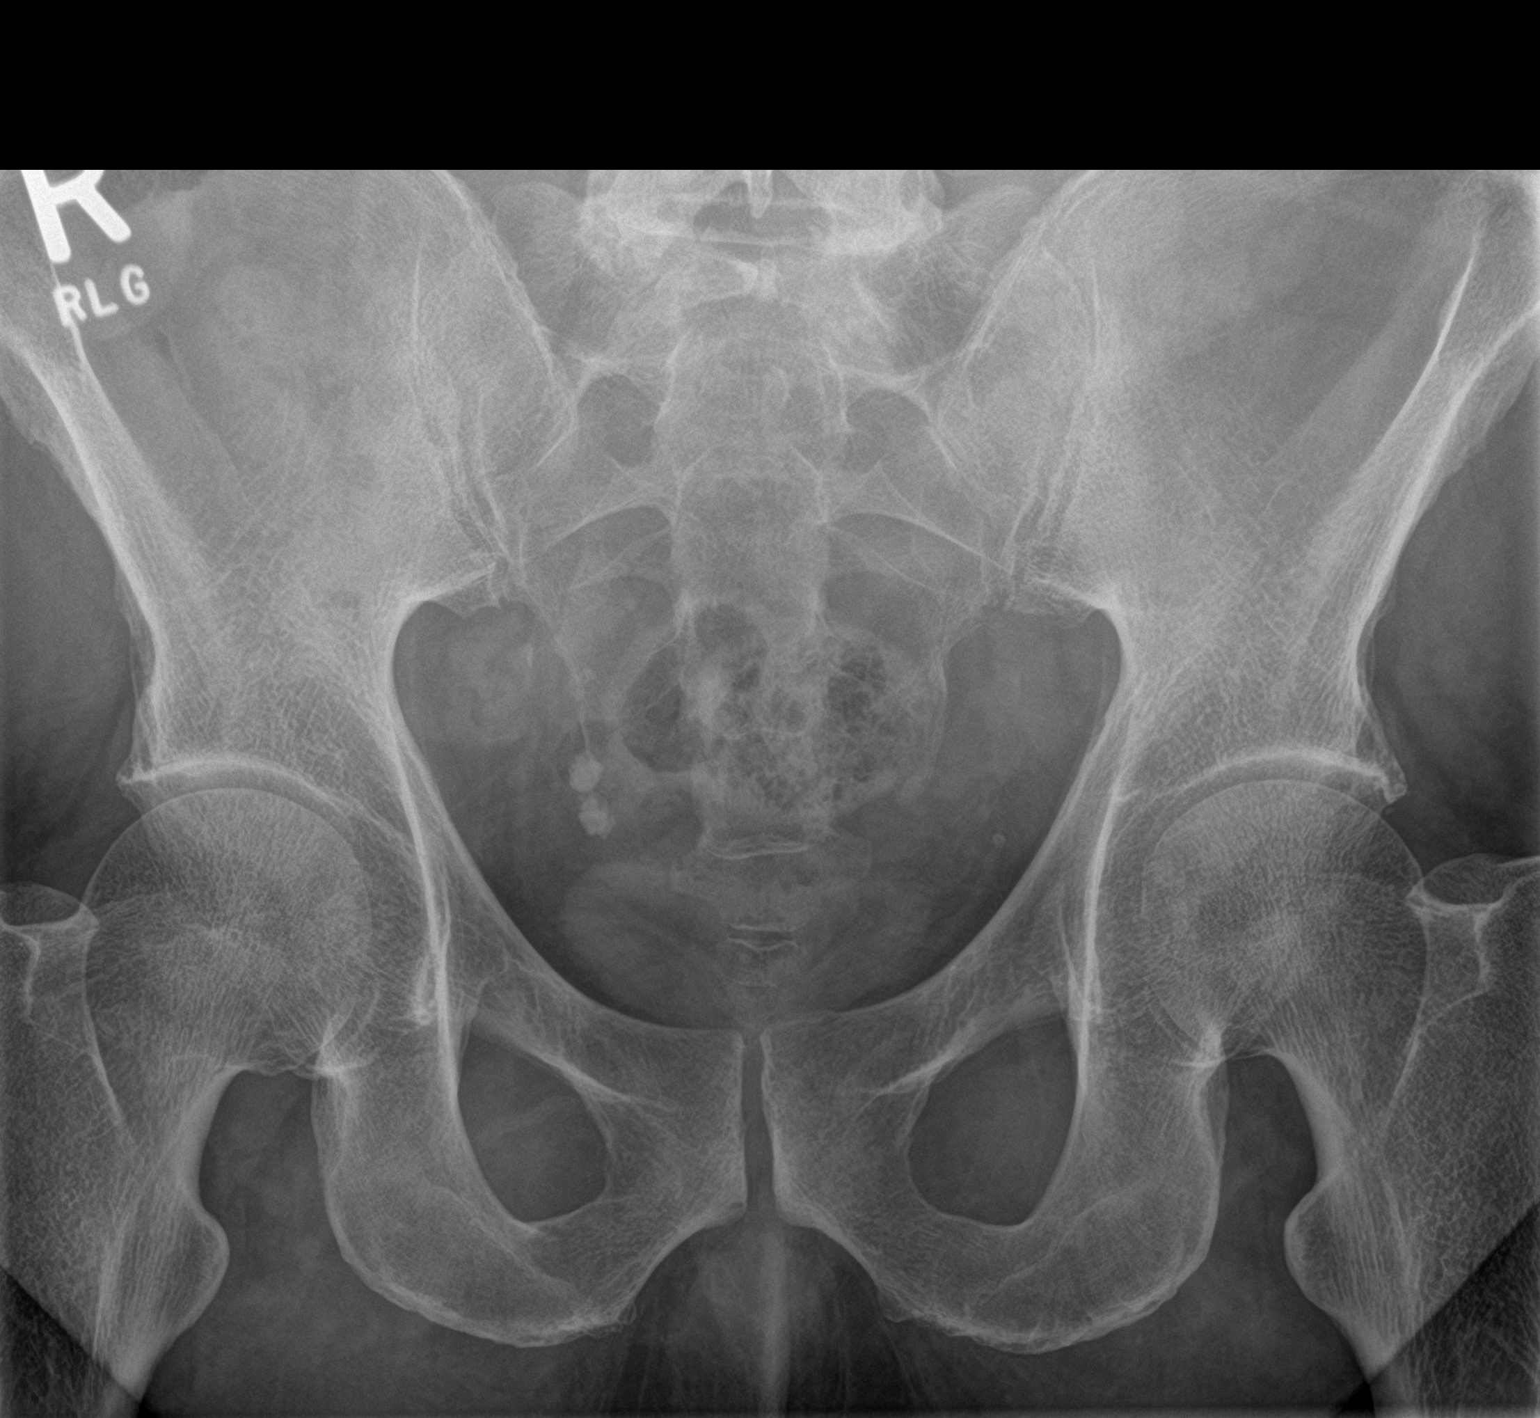

[2 of 2 positions shown; findings below may reference images not displayed]

FINDINGS: There are 2 small stones that project within the right. No evidence
kidney of left ureteral stone. Are rounded soft tissue opacities
that project over the medial mid to upper pole the right kidney and
midpole of the left kidney which could reflect renal masses or
cysts.

There are 2 adjacent 7 mm calculi in right pelvis slightly more
inferior than they were on the 03/02/2019 radiographs. One or both
of these are consistent with distal ureteral calculi.

Normal bowel gas pattern.  Skeletal structures are unremarkable.
IMPRESSION: 1. 2 adjacent 7 mm stones in the right pelvis are consistent with
distal right ureteral stones.
2. Two small intrarenal stones on the right.
3. Questionable bilateral renal masses, which could be further
assessed with renal ultrasound.

## 2020-01-21 ENCOUNTER — Ambulatory Visit: Payer: Managed Care, Other (non HMO) | Admitting: Nurse Practitioner

## 2020-03-08 ENCOUNTER — Other Ambulatory Visit: Payer: Self-pay | Admitting: Nurse Practitioner

## 2020-03-08 DIAGNOSIS — E785 Hyperlipidemia, unspecified: Secondary | ICD-10-CM

## 2020-03-08 DIAGNOSIS — R748 Abnormal levels of other serum enzymes: Secondary | ICD-10-CM

## 2020-03-10 ENCOUNTER — Other Ambulatory Visit: Payer: Self-pay | Admitting: Nurse Practitioner

## 2020-03-10 DIAGNOSIS — I10 Essential (primary) hypertension: Secondary | ICD-10-CM

## 2020-04-05 ENCOUNTER — Other Ambulatory Visit: Payer: Self-pay | Admitting: Nurse Practitioner

## 2020-04-05 DIAGNOSIS — E669 Obesity, unspecified: Secondary | ICD-10-CM

## 2020-04-05 DIAGNOSIS — E1169 Type 2 diabetes mellitus with other specified complication: Secondary | ICD-10-CM

## 2020-04-12 ENCOUNTER — Encounter (HOSPITAL_BASED_OUTPATIENT_CLINIC_OR_DEPARTMENT_OTHER): Payer: Self-pay | Admitting: Emergency Medicine

## 2020-04-12 ENCOUNTER — Other Ambulatory Visit: Payer: Self-pay

## 2020-04-12 ENCOUNTER — Emergency Department (HOSPITAL_BASED_OUTPATIENT_CLINIC_OR_DEPARTMENT_OTHER)
Admission: EM | Admit: 2020-04-12 | Discharge: 2020-04-12 | Disposition: A | Discharge status: Home / Self Care | Payer: Managed Care, Other (non HMO) | Attending: Emergency Medicine | Admitting: Emergency Medicine

## 2020-04-12 DIAGNOSIS — I10 Essential (primary) hypertension: Secondary | ICD-10-CM | POA: Insufficient documentation

## 2020-04-12 DIAGNOSIS — Z7983 Long term (current) use of bisphosphonates: Secondary | ICD-10-CM | POA: Insufficient documentation

## 2020-04-12 DIAGNOSIS — H5711 Ocular pain, right eye: Secondary | ICD-10-CM | POA: Diagnosis present

## 2020-04-12 DIAGNOSIS — E119 Type 2 diabetes mellitus without complications: Secondary | ICD-10-CM | POA: Insufficient documentation

## 2020-04-12 DIAGNOSIS — H16001 Unspecified corneal ulcer, right eye: Secondary | ICD-10-CM | POA: Diagnosis not present

## 2020-04-12 DIAGNOSIS — Z87891 Personal history of nicotine dependence: Secondary | ICD-10-CM | POA: Insufficient documentation

## 2020-04-12 DIAGNOSIS — Z7982 Long term (current) use of aspirin: Secondary | ICD-10-CM | POA: Insufficient documentation

## 2020-04-12 DIAGNOSIS — Z7952 Long term (current) use of systemic steroids: Secondary | ICD-10-CM | POA: Diagnosis not present

## 2020-04-12 DIAGNOSIS — Z794 Long term (current) use of insulin: Secondary | ICD-10-CM | POA: Insufficient documentation

## 2020-04-12 MED ORDER — FLUORESCEIN SODIUM 1 MG OP STRP
1.0000 | ORAL_STRIP | Freq: Once | OPHTHALMIC | Status: AC
  Administered 2020-04-12: 1 via OPHTHALMIC
  Filled 2020-04-12: qty 1

## 2020-04-12 MED ORDER — TETRACAINE HCL 0.5 % OP SOLN
2.0000 [drp] | Freq: Once | OPHTHALMIC | Status: AC
  Administered 2020-04-12: 2 [drp] via OPHTHALMIC
  Filled 2020-04-12: qty 4

## 2020-04-12 MED ORDER — CIPROFLOXACIN HCL 0.3 % OP SOLN
1.0000 [drp] | OPHTHALMIC | Status: DC
  Administered 2020-04-12: 1 [drp] via OPHTHALMIC
  Filled 2020-04-12: qty 2.5

## 2020-04-12 NOTE — ED Triage Notes (Signed)
Pt is c/o right eye pain that started earlier tonight  Denies injury  Pt states his eye is draining  Pt has redness noted to his eyelids

## 2020-04-12 NOTE — ED Provider Notes (Signed)
[facility] Provider Note: Lowella Dell, MD, FACEP  CSN: [id number] MRN: [id number] ARRIVAL: 04/12/20 at 0231 ROOM: MH10/MH10   CHIEF COMPLAINT  Eye Pain   HISTORY OF PRESENT ILLNESS  04/12/20 3:11 AM Kyle Mcclure is a 59 y.o. male who wears contact lenses.  He developed right eye irritation yesterday morning that worsened throughout the day.  He took his contacts out yesterday evening when he came home from work.  He awoke this morning from sleep with increased eye irritation.  He rates his pain as a 7 out of 10.  It is somewhat worse with exposure to light.  He has had erythema of the right eye with increased watering.  He has gotten some relief with cold compresses.   Past Medical History:  Diagnosis Date  . Fatty liver    followed by dr [name]--- mild elevated LFT since 2015,  blood work-up done 05/ 2020 (epic), and abd. ultrasound 10-16-2018 (epic),  recommendation loss weight  . History of kidney stones   . Hyperlipidemia   . Hypertension   . Seasonal allergies   . Type 2 diabetes mellitus (HCC)    followed by pcp--  fasting cbg-- 140;  had been checking blood surgar past few weeks  . Ureteral calculi    right  . Wears contact lenses     Past Surgical History:  Procedure Laterality Date  . COLONOSCOPY WITH PROPOFOL  03-13-2015  dr [name]  . CYSTOSCOPY WITH HOLMIUM LASER LITHOTRIPSY  12-23-2003   dr Vernie Ammons  @[facility]   . CYSTOSCOPY/RETROGRADE/URETEROSCOPY/STONE EXTRACTION WITH BASKET Right 03/26/2019   Procedure: CYSTOSCOPY/RETROGRADE/URETEROSCOPY/STONE EXTRACTION WITH BASKET/ STENT PLACEMENT;  Surgeon: 03/28/2019, MD;  Location: [facility];  Service: Urology;  Laterality: Right;  . EXTRACORPOREAL SHOCK WAVE LITHOTRIPSY  11-05-2012   @[facility]   . TONSILLECTOMY  child    Family History  Problem Relation Age of Onset  . Cancer Mother   . Colon cancer Neg Hx   . Rectal cancer Neg Hx   . Stomach cancer Neg Hx   . Esophageal cancer Neg Hx      Social History   Tobacco Use  . Smoking status: Former Smoker    Packs/day: 0.50    Years: 3.00    Pack years: 1.50    Types: Cigarettes    Quit date: 06/03/1990    Years since quitting: 29.8  . Smokeless tobacco: Never Used  Vaping Use  . Vaping Use: Never used  Substance Use Topics  . Alcohol use: Yes    Alcohol/week: 4.0 standard drinks    Types: 4 Standard drinks or equivalent per week    Comment: Beer off/on   . Drug use: Never    Prior to Admission medications   Medication Sig Start Date End Date Taking? Authorizing Provider  aspirin 81 MG tablet Take 81 mg by mouth daily.    [provider]  atorvastatin (LIPITOR) 20 MG tablet TAKE 1 TABLET BY MOUTH EVERY DAY 03/08/20   08/02/1990, NP  cetirizine (ZYRTEC) 10 MG tablet Take 10 mg by mouth daily.    [provider]  Dulaglutide (TRULICITY) 1.5 MG/0.5ML SOPN Inject 1.5mg  into the skin once a week. 09/17/19   [name], NP  fluticasone (FLONASE) 50 MCG/ACT nasal spray Place 1 spray into both nostrils as needed for allergies or rhinitis. 09/28/18   [name], NP  glucose blood [facility] VERIO) test strip Use to test blood sugar twice daily Dx: E11.69 09/28/18  [name], NP  ibuprofen (ADVIL) 200 MG tablet Take 200 mg by mouth every 6 (six) hours as needed.    [provider]  JARDIANCE 25 MG TABS tablet TAKE 1 TABLET BY MOUTH EVERY DAY 04/05/20   [name], NP  Lancet Devices (ONE TOUCH DELICA LANCING DEV) MISC Check blood sugar 2 x daily as directed DX: 250.02 09/28/18   [name], NP  lisinopril (ZESTRIL) 10 MG tablet TAKE 1 TABLET BY MOUTH EVERY DAY 03/10/20   [name], NP  metFORMIN (GLUCOPHAGE) 1000 MG tablet TAKE 1 TABLET BY MOUTH 2 TIMES DAILY WITH A MEAL. 09/17/19   [name], NP  naproxen sodium (ALEVE) 220 MG tablet Take 220 mg by mouth as needed.    [provider]  pioglitazone (ACTOS) 15 MG tablet TAKE 1 TABLET  BY MOUTH EVERY DAY 12/07/19   [name], NP  potassium citrate (UROCIT-K) 10 MEQ (1080 MG) SR tablet at bedtime. 2 by mouth once daily to prevent kidney stones  09/21/13   Oneal Grout, MD    Allergies Hydrocodone and Oxycodone   REVIEW OF SYSTEMS  Negative except as noted here or in the History of Present Illness.   PHYSICAL EXAMINATION  Initial Vital Signs Blood pressure (!) 162/101, pulse 79, temperature 98.1 F (36.7 C), temperature source Oral, resp. rate 16, height 5\' 8"  (1.727 m), weight 106.6 kg, SpO2 97 %.  Examination General: Well-developed, well-nourished male in no acute distress; appearance consistent with age of record HENT: normocephalic; atraumatic Eyes: pupils equal, round and reactive to light; extraocular muscles intact; right conjunctival injection with serous exudate; no fluorescein uptake but small opacity seen in right cornea at about 10:00:    Neck: supple Heart: regular rate and rhythm Lungs: clear to auscultation bilaterally Abdomen: soft; nondistended; nontender; bowel sounds present Extremities: No deformity; full range of motion Neurologic: Awake, alert and oriented; motor function intact in all extremities and symmetric; no facial droop Skin: Warm and dry Psychiatric: Normal mood and affect   RESULTS  Summary of this visit's results, reviewed and interpreted by myself:   EKG Interpretation  Date/Time:    Ventricular Rate:    PR Interval:    QRS Duration:   QT Interval:    QTC Calculation:   R Axis:     Text Interpretation:        Laboratory Studies: No results found for this or any previous visit (from the past 24 hour(s)). Imaging Studies: No results found.  ED COURSE and MDM  Nursing notes, initial and subsequent vitals signs, including pulse oximetry, reviewed and interpreted by myself.  Vitals:   04/12/20 0243 04/12/20 0244  BP: (!) 162/101   Pulse: 79   Resp: 16   Temp: 98.1 F (36.7 C)   TempSrc: Oral    SpO2: 97%   Weight:  106.6 kg  Height:  5\' 8"  (1.727 m)   Medications  ciprofloxacin (CILOXAN) 0.3 % ophthalmic solution 1 drop (has no administration in time range)  fluorescein ophthalmic strip 1 strip (1 strip Right Eye Given by Other 04/12/20 0320)  tetracaine (PONTOCAINE) 0.5 % ophthalmic solution 2 drop (2 drops Right Eye Given by Other 04/12/20 0320)    We will start patient on Cipro eyedrops and have him follow-up with his ophthalmologist, Dr. 13/10/21, later today.  PROCEDURES  Procedures   ED DIAGNOSES     ICD-10-CM   1. 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

## 2020-05-18 ENCOUNTER — Other Ambulatory Visit: Payer: Self-pay | Admitting: Nurse Practitioner

## 2020-05-18 DIAGNOSIS — E669 Obesity, unspecified: Secondary | ICD-10-CM

## 2020-05-18 NOTE — Telephone Encounter (Signed)
Patient has request refill on medication "Metformin 100 mg " Patient was last given refill on 09/17/2019 with 180 tablets to be taken two times daily. Patient last refill was 8 months ago so I'm sure if this medication is long term. Medication pend and sent to PCP [name], NP . Please Advise.

## 2020-06-06 ENCOUNTER — Other Ambulatory Visit: Payer: Self-pay | Admitting: Nurse Practitioner

## 2020-06-06 DIAGNOSIS — E1169 Type 2 diabetes mellitus with other specified complication: Secondary | ICD-10-CM

## 2020-06-21 ENCOUNTER — Other Ambulatory Visit: Payer: Managed Care, Other (non HMO)

## 2020-06-21 ENCOUNTER — Other Ambulatory Visit: Payer: Self-pay

## 2020-06-21 DIAGNOSIS — E1169 Type 2 diabetes mellitus with other specified complication: Secondary | ICD-10-CM

## 2020-06-21 DIAGNOSIS — I10 Essential (primary) hypertension: Secondary | ICD-10-CM

## 2020-06-21 DIAGNOSIS — E785 Hyperlipidemia, unspecified: Secondary | ICD-10-CM

## 2020-06-22 ENCOUNTER — Other Ambulatory Visit: Payer: Self-pay | Admitting: Nurse Practitioner

## 2020-06-22 DIAGNOSIS — E669 Obesity, unspecified: Secondary | ICD-10-CM

## 2020-06-22 DIAGNOSIS — E1169 Type 2 diabetes mellitus with other specified complication: Secondary | ICD-10-CM

## 2020-06-22 LAB — COMPLETE METABOLIC PANEL WITH GFR
AG Ratio: 1.8 (calc) (ref 1.0–2.5)
ALT: 33 U/L (ref 9–46)
AST: 28 U/L (ref 10–35)
Albumin: 4.6 g/dL (ref 3.6–5.1)
Alkaline phosphatase (APISO): 80 U/L (ref 35–144)
BUN: 17 mg/dL (ref 7–25)
CO2: 27 mmol/L (ref 20–32)
Calcium: 9.7 mg/dL (ref 8.6–10.3)
Chloride: 103 mmol/L (ref 98–110)
Creat: 0.91 mg/dL (ref 0.70–1.33)
GFR, Est African American: 107 mL/min/{1.73_m2} (ref >=60)
GFR, Est Non African American: 92 mL/min/{1.73_m2} (ref >=60)
Globulin: 2.5 g/dL (calc) (ref 1.9–3.7)
Glucose, Bld: 136 mg/dL — ABNORMAL HIGH (ref 65–99)
Potassium: 4.3 mmol/L (ref 3.5–5.3)
Sodium: 139 mmol/L (ref 135–146)
Total Bilirubin: 1 mg/dL (ref 0.2–1.2)
Total Protein: 7.1 g/dL (ref 6.1–8.1)

## 2020-06-22 LAB — LIPID PANEL
Cholesterol: 141 mg/dL (ref <200)
HDL: 35 mg/dL — ABNORMAL LOW (ref >=40)
LDL Cholesterol (Calc): 74 mg/dL (calc)
Non-HDL Cholesterol (Calc): 106 mg/dL (calc) (ref <130)
Total CHOL/HDL Ratio: 4 (calc) (ref <5.0)
Triglycerides: 234 mg/dL — ABNORMAL HIGH (ref <150)

## 2020-06-22 LAB — CBC WITH DIFFERENTIAL/PLATELET
Absolute Monocytes: 476 cells/uL (ref 200–950)
Basophils Absolute: 109 cells/uL (ref 0–200)
Basophils Relative: 1.4 %
Eosinophils Absolute: 257 cells/uL (ref 15–500)
Eosinophils Relative: 3.3 %
HCT: 45.1 % (ref 38.5–50.0)
Hemoglobin: 15.3 g/dL (ref 13.2–17.1)
Lymphs Abs: 2200 cells/uL (ref 850–3900)
MCH: 30.2 pg (ref 27.0–33.0)
MCHC: 33.9 g/dL (ref 32.0–36.0)
MCV: 89 fL (ref 80.0–100.0)
MPV: 10 fL (ref 7.5–12.5)
Monocytes Relative: 6.1 %
Neutro Abs: 4758 cells/uL (ref 1500–7800)
Neutrophils Relative %: 61 %
Platelets: 240 10*3/uL (ref 140–400)
RBC: 5.07 10*6/uL (ref 4.20–5.80)
RDW: 12.2 % (ref 11.0–15.0)
Total Lymphocyte: 28.2 %
WBC: 7.8 10*3/uL (ref 3.8–10.8)

## 2020-06-22 LAB — HEMOGLOBIN A1C
Hgb A1c MFr Bld: 6.8 % of total Hgb — ABNORMAL HIGH (ref <5.7)
Mean Plasma Glucose: 148 mg/dL
eAG (mmol/L): 8.2 mmol/L

## 2020-06-26 ENCOUNTER — Ambulatory Visit: Payer: Managed Care, Other (non HMO) | Admitting: Nurse Practitioner

## 2020-06-27 ENCOUNTER — Telehealth: Payer: Self-pay | Admitting: *Deleted

## 2020-06-27 NOTE — Telephone Encounter (Signed)
Received fax from [facility] for prior authorization for Trulicity.  Initiated through [employer]. Went into determination with response in 48-72 hours.  Key: Kyle Mcclure

## 2020-06-29 NOTE — Telephone Encounter (Signed)
Received fax from [employer] stating that patient's Trulicity is APPROVED 07/09/2020-07/09/2021

## 2020-07-03 ENCOUNTER — Encounter: Payer: Self-pay | Admitting: Nurse Practitioner

## 2020-07-03 ENCOUNTER — Other Ambulatory Visit: Payer: Self-pay

## 2020-07-03 ENCOUNTER — Ambulatory Visit (INDEPENDENT_AMBULATORY_CARE_PROVIDER_SITE_OTHER): Payer: Managed Care, Other (non HMO) | Admitting: Nurse Practitioner

## 2020-07-03 VITALS — BP 138/86 | HR 67 | Temp 96.0°F | Ht 69.0 in | Wt 235.0 lb

## 2020-07-03 DIAGNOSIS — E669 Obesity, unspecified: Secondary | ICD-10-CM | POA: Diagnosis not present

## 2020-07-03 DIAGNOSIS — E785 Hyperlipidemia, unspecified: Secondary | ICD-10-CM | POA: Diagnosis not present

## 2020-07-03 DIAGNOSIS — R748 Abnormal levels of other serum enzymes: Secondary | ICD-10-CM

## 2020-07-03 DIAGNOSIS — Z6836 Body mass index (BMI) 36.0-36.9, adult: Secondary | ICD-10-CM

## 2020-07-03 DIAGNOSIS — E1169 Type 2 diabetes mellitus with other specified complication: Secondary | ICD-10-CM

## 2020-07-03 DIAGNOSIS — I10 Essential (primary) hypertension: Secondary | ICD-10-CM

## 2020-07-03 MED ORDER — EMPAGLIFLOZIN 25 MG PO TABS: 25.0000 mg | ORAL_TABLET | Freq: Every day | ORAL | 1 refills | Status: DC

## 2020-07-03 NOTE — Progress Notes (Signed)
Careteam: Patient Care Team: Lauree Chandler, NP as PCP - General (Nurse Practitioner) Sharol Roussel, [place] as Consulting Physician (Optometry) Kathie Rhodes, MD (Inactive) as Consulting Physician (Urology)  PLACE OF SERVICE:  [place] Directive information Does Patient Have a Medical Advance Directive?: Yes, Type of Advance Directive: [place];Living will, Does patient want to make changes to medical advance directive?: No - Patient declined  Allergies  Allergen Reactions  . Hydrocodone Nausea And Vomiting  . Oxycodone Nausea And Vomiting    Chief Complaint  Patient presents with  . Medical Management of Chronic Issues    6 month follow-up and discuss labs (copy printed). Foot exam today.      HPI: Patient is a 60 y.o. male for routine follow up.   DM- no numbness or tingling to feet.  Had a eye ulcer and been following with ophthalmology.    Getting 6500 steps a day at work. Working on diet.  Has decreased intake.   Hyperlipidemia- elevated triglycerides- likes dairy.    Review of Systems:  Review of Systems  Constitutional: Negative for chills, fever and weight loss.  HENT: Negative for tinnitus.   Respiratory: Negative for cough, sputum production and shortness of breath.   Cardiovascular: Negative for chest pain, palpitations and leg swelling.  Gastrointestinal: Negative for abdominal pain, constipation, diarrhea and heartburn.  Genitourinary: Negative for dysuria, frequency and urgency.  Musculoskeletal: Negative for back pain, falls, joint pain and myalgias.  Skin: Negative.   Neurological: Negative for dizziness and headaches.  Psychiatric/Behavioral: Negative for depression and memory loss. The patient does not have insomnia.     Past Medical History:  Diagnosis Date  . Fatty liver    followed by dr Ardis Hughs--- mild elevated LFT since 2015,  blood work-up done 05/ 2020 (epic), and abd. ultrasound 10-16-2018 (epic),   recommendation loss weight  . History of kidney stones   . Hyperlipidemia   . Hypertension   . Seasonal allergies   . Type 2 diabetes mellitus ([place])    followed by pcp--  fasting cbg-- 140;  had been checking blood surgar past few weeks  . Ureteral calculi    right  . Wears contact lenses    Past Surgical History:  Procedure Laterality Date  . COLONOSCOPY WITH PROPOFOL  03-13-2015  dr Ardis Hughs  . CYSTOSCOPY WITH HOLMIUM LASER LITHOTRIPSY  12-23-2003   dr Karsten Ro  @[facility]   . CYSTOSCOPY/RETROGRADE/URETEROSCOPY/STONE EXTRACTION WITH BASKET Right 03/26/2019   Procedure: CYSTOSCOPY/RETROGRADE/URETEROSCOPY/STONE EXTRACTION WITH BASKET/ STENT PLACEMENT;  Surgeon: Kathie Rhodes, MD;  Location: [place];  Service: Urology;  Laterality: Right;  . EXTRACORPOREAL SHOCK WAVE LITHOTRIPSY  11-05-2012   @[facility]   . TONSILLECTOMY  child   Social History:   reports that he quit smoking about 30 years ago. His smoking use included cigarettes. He has a 1.50 pack-year smoking history. He has never used smokeless tobacco. He reports current alcohol use of about 4.0 standard drinks of alcohol per week. He reports that he does not use drugs.  Family History  Problem Relation Age of Onset  . Cancer Mother   . Colon cancer Neg Hx   . Rectal cancer Neg Hx   . Stomach cancer Neg Hx   . Esophageal cancer Neg Hx     Medications: Patient's Medications  New Prescriptions   No medications on file  Previous Medications   ASPIRIN 81 MG TABLET    Take 81 mg by mouth daily.   ATORVASTATIN (  LIPITOR) 20 MG TABLET    TAKE 1 TABLET BY MOUTH EVERY DAY   CETIRIZINE (ZYRTEC) 10 MG TABLET    Take 10 mg by mouth daily.   DULAGLUTIDE (TRULICITY) 1.5 HL/4.5GY SOPN    Inject 1.69m into the skin once a week.   FLUTICASONE (FLONASE) 50 MCG/ACT NASAL SPRAY    Place 1 spray into both nostrils as needed for allergies or rhinitis.   GLUCOSE BLOOD (ONETOUCH VERIO) TEST STRIP    Use to test blood sugar twice daily Dx:  E11.69   IBUPROFEN (ADVIL) 200 MG TABLET    Take 200 mg by mouth every 6 (six) hours as needed.   JARDIANCE 25 MG TABS TABLET    TAKE 1 TABLET BY MOUTH EVERY DAY   LANCET DEVICES (ONE TOUCH DELICA LANCING DEV) MISC    Check blood sugar 2 x daily as directed DX: 250.02   LISINOPRIL (ZESTRIL) 10 MG TABLET    TAKE 1 TABLET BY MOUTH EVERY DAY   METFORMIN (GLUCOPHAGE) 1000 MG TABLET    TAKE 1 TABLET BY MOUTH 2 TIMES DAILY WITH A MEAL.   NAPROXEN SODIUM (ALEVE) 220 MG TABLET    Take 220 mg by mouth as needed.   PIOGLITAZONE (ACTOS) 15 MG TABLET    TAKE 1 TABLET BY MOUTH EVERY DAY   POTASSIUM CITRATE (UROCIT-K) 10 MEQ (1080 MG) SR TABLET    at bedtime. 2 by mouth once daily to prevent kidney stones  Modified Medications   No medications on file  Discontinued Medications   No medications on file    Physical Exam:  Vitals:   07/03/20 0930  BP: 138/86  Pulse: 67  Temp: (!) 96 F (35.6 C)  TempSrc: Temporal  SpO2: 97%  Weight: 235 lb (106.6 kg)  Height: 5' 9"  (1.753 m)   Body mass index is 34.7 kg/m. Wt Readings from Last 3 Encounters:  07/03/20 235 lb (106.6 kg)  04/12/20 235 lb (106.6 kg)  12/24/19 (!) 234 lb (106.1 kg)    Physical Exam Constitutional:      General: He is not in acute distress.    Appearance: He is well-developed and well-nourished. He is not diaphoretic.  HENT:     Head: Normocephalic and atraumatic.     Mouth/Throat:     Mouth: Oropharynx is clear and moist.     Pharynx: No oropharyngeal exudate.  Eyes:     Extraocular Movements: EOM normal.     Conjunctiva/sclera: Conjunctivae normal.     Pupils: Pupils are equal, round, and reactive to light.  Cardiovascular:     Rate and Rhythm: Normal rate and regular rhythm.     Heart sounds: Normal heart sounds.  Pulmonary:     Effort: Pulmonary effort is normal.     Breath sounds: Normal breath sounds.  Abdominal:     General: Bowel sounds are normal.     Palpations: Abdomen is soft.  Musculoskeletal:         General: No tenderness or edema.     Cervical back: Normal range of motion and neck supple.  Skin:    General: Skin is warm and dry.  Neurological:     Mental Status: He is alert and oriented to person, place, and time.  Psychiatric:        Mood and Affect: Mood and affect and mood normal.        Behavior: Behavior normal.        Thought Content: Thought content normal.  Labs reviewed: Basic Metabolic Panel: Recent Labs    10/29/19 0819 12/17/19 0826 06/21/20 0825  NA 139 140 139  K 4.4 4.3 4.3  CL 102 104 103  CO2 26 23 27   GLUCOSE 139* 115* 136*  BUN 21 16 17   CREATININE 1.06 0.87 0.91  CALCIUM 9.9 9.4 9.7   Liver Function Tests: Recent Labs    10/29/19 0819 12/17/19 0826 06/21/20 0825  AST 31 30 28   ALT 38 33 33  BILITOT 0.9 0.9 1.0  PROT 7.2 6.7 7.1   No results for input(s): LIPASE, AMYLASE in the last 8760 hours. No results for input(s): AMMONIA in the last 8760 hours. CBC: Recent Labs    12/17/19 0826 06/21/20 0825  WBC 7.5 7.8  NEUTROABS 4,718 4,758  HGB 14.4 15.3  HCT 43.1 45.1  MCV 90.4 89.0  PLT 216 240   Lipid Panel: Recent Labs    10/29/19 0819 12/17/19 0826 06/21/20 0825  CHOL 131 138 141  HDL 36* 35* 35*  LDLCALC 70 73 74  TRIG 173* 198* 234*  CHOLHDL 3.6 3.9 4.0   TSH: No results for input(s): TSH in the last 8760 hours. A1C: Lab Results  Component Value Date   HGBA1C 6.8 (H) 06/21/2020     Assessment/Plan 1. Diabetes mellitus type 2 in obese (HCC) -A1c mildly up from previous but Continues to be at goal to continue to work on dietary modifications and exercise. Will continue current medication regimen with trulicity weekly, jardiance daily, actos daily and metformin twice daily  - empagliflozin (JARDIANCE) 25 MG TABS tablet; Take 1 tablet (25 mg total) by mouth daily.  Dispense: 90 tablet; Refill: 1 - Hemoglobin A1c; Future  2. Hyperlipidemia LDL goal <70 -elevated triglycerides. Discussed dietary modifications to  bring down. Continues on lipitor 20 mg daily  - Lipid Panel; Future  3. Class 2 severe obesity due to excess calories with serious comorbidity and body mass index (BMI) of 36.0 to 36.9 in adult [facility]) -discussed importance of weight loss through dietary modifications and exercise.   4. Elevated liver enzymes -in normal range at this time, continue low fat diet.  -continue low fat diet.  - CMP with eGFR([facility]); Future  5. Essential hypertension -stable on lisinopril - CMP with eGFR([facility]); Future - CBC with Differential/Platelet; Future     Next appt: 6 months, labs prior. [facility]. [place], [facility] [phone]

## 2020-07-10 LAB — HM DIABETES EYE EXAM

## 2020-07-16 ENCOUNTER — Other Ambulatory Visit: Payer: Self-pay | Admitting: Nurse Practitioner

## 2020-07-16 DIAGNOSIS — E785 Hyperlipidemia, unspecified: Secondary | ICD-10-CM

## 2020-07-16 DIAGNOSIS — R748 Abnormal levels of other serum enzymes: Secondary | ICD-10-CM

## 2020-08-13 ENCOUNTER — Other Ambulatory Visit: Payer: Self-pay | Admitting: Nurse Practitioner

## 2020-08-13 DIAGNOSIS — E1169 Type 2 diabetes mellitus with other specified complication: Secondary | ICD-10-CM

## 2020-08-27 ENCOUNTER — Encounter: Payer: Self-pay | Admitting: Emergency Medicine

## 2020-08-27 ENCOUNTER — Other Ambulatory Visit: Payer: Self-pay

## 2020-08-27 ENCOUNTER — Emergency Department (INDEPENDENT_AMBULATORY_CARE_PROVIDER_SITE_OTHER)
Admission: EM | Admit: 2020-08-27 | Discharge: 2020-08-27 | Disposition: A | Discharge status: Home / Self Care | Payer: Managed Care, Other (non HMO) | Source: Home / Self Care | Attending: Family Medicine | Admitting: Family Medicine

## 2020-08-27 DIAGNOSIS — S61212A Laceration without foreign body of right middle finger without damage to nail, initial encounter: Secondary | ICD-10-CM

## 2020-08-27 MED ORDER — CEPHALEXIN 500 MG PO CAPS: 500.0000 mg | ORAL_CAPSULE | Freq: Three times a day (TID) | ORAL | 0 refills | Status: DC

## 2020-08-27 NOTE — Discharge Instructions (Signed)
Try to keep clean and reasonably dry.  If it gets briefly wet, pat dry.  Do not soak Try to keep tape on for at least 5 to 7 days Put extra tape on if it starts to left before that I am going to call in an antibiotic to take for 3 days.  This will help prevent infection from setting in If you have any questions or problems please come back for recheck

## 2020-08-27 NOTE — ED Provider Notes (Signed)
[facility]    CSN: [id number] Arrival date & time: 08/27/20  1053      History   Chief Complaint Chief Complaint  Patient presents with  . Extremity Laceration    R middle finger    HPI Kyle Mcclure is a 60 y.o. male.   HPI   Patient accidentally cut his long finger right hand yesterday with a pocket knife.  He has a laceration that still bleeds if it is bumped.  He tried to tape it together.  When he pulled the tape off this morning he has started bleeding again.  He is here for evaluation.  Patient is diabetic.  He worries about wound infection  Patient states he is compliant with his medications.  He feels like he is healthy.  He is a non-smoker.  Past Medical History:  Diagnosis Date  . Fatty liver    followed by dr [name]--- mild elevated LFT since 2015,  blood work-up done 05/ 2020 (epic), and abd. ultrasound 10-16-2018 (epic),  recommendation loss weight  . History of kidney stones   . Hyperlipidemia   . Hypertension   . Seasonal allergies   . Type 2 diabetes mellitus (HCC)    followed by pcp--  fasting cbg-- 140;  had been checking blood surgar past few weeks  . Ureteral calculi    right  . Wears contact lenses     Patient Active Problem List   Diagnosis Date Noted  . Diabetes mellitus type 2 in obese (HCC) 04/05/2014  . Hyperlipidemia LDL goal <100 12/21/2013  . Class 2 severe obesity with serious comorbidity and body mass index (BMI) of 35.0 to 35.9 in adult (HCC) 06/23/2013  . Type II or unspecified type diabetes mellitus without mention of complication, uncontrolled 06/15/2013  . Other and unspecified hyperlipidemia 06/15/2013  . Hypopotassemia 06/15/2013  . Essential hypertension 06/15/2013  . BPH (benign prostatic hyperplasia) 06/15/2013  . Allergic rhinitis 06/15/2013  . Acute sinus infection 06/15/2013    Past Surgical History:  Procedure Laterality Date  . COLONOSCOPY WITH PROPOFOL  03-13-2015  dr [name]  . CYSTOSCOPY WITH  HOLMIUM LASER LITHOTRIPSY  12-23-2003   dr Vernie Ammons  @[facility]   . CYSTOSCOPY/RETROGRADE/URETEROSCOPY/STONE EXTRACTION WITH BASKET Right 03/26/2019   Procedure: CYSTOSCOPY/RETROGRADE/URETEROSCOPY/STONE EXTRACTION WITH BASKET/ STENT PLACEMENT;  Surgeon: 03/28/2019, MD;  Location: [facility];  Service: Urology;  Laterality: Right;  . EXTRACORPOREAL SHOCK WAVE LITHOTRIPSY  11-05-2012   @[facility]   . TONSILLECTOMY  child       Home Medications    Prior to Admission medications   Medication Sig Start Date End Date Taking? Authorizing Provider  aspirin 81 MG tablet Take 81 mg by mouth daily.   Yes [provider]  atorvastatin (LIPITOR) 20 MG tablet TAKE 1 TABLET BY MOUTH EVERY DAY 07/17/20  Yes , NP  cephALEXin (KEFLEX) 500 MG capsule Take 1 capsule (500 mg total) by mouth 3 (three) times daily. 08/27/20  Yes [name], MD  cetirizine (ZYRTEC) 10 MG tablet Take 10 mg by mouth daily.   Yes [provider]  empagliflozin (JARDIANCE) 25 MG TABS tablet Take 1 tablet (25 mg total) by mouth daily. 07/03/20  Yes Eustace Moore, NP  fluticasone (FLONASE) 50 MCG/ACT nasal spray Place 1 spray into both nostrils as needed for allergies or rhinitis. 09/28/18  Yes [name], NP  ibuprofen (ADVIL) 200 MG tablet Take 200 mg by mouth every 6 (six) hours as needed.  Yes [provider]  lisinopril (ZESTRIL) 10 MG tablet TAKE 1 TABLET BY MOUTH EVERY DAY 03/10/20  Yes [name], NP  metFORMIN (GLUCOPHAGE) 1000 MG tablet TAKE 1 TABLET BY MOUTH 2 TIMES DAILY WITH A MEAL. 08/14/20  Yes [name], NP  naproxen sodium (ALEVE) 220 MG tablet Take 220 mg by mouth as needed.   Yes [provider]  pioglitazone (ACTOS) 15 MG tablet TAKE 1 TABLET BY MOUTH EVERY DAY 06/06/20  Yes [name], NP  potassium citrate (UROCIT-K) 10 MEQ (1080 MG) SR tablet at bedtime. 2 by mouth once daily to prevent kidney stones 09/21/13  Yes  Oneal Grout, MD  Dulaglutide (TRULICITY) 1.5 MG/0.5ML SOPN Inject 1.5mg  into the skin once a week. 09/17/19   [name], NP  glucose blood [facility] VERIO) test strip Use to test blood sugar twice daily Dx: E11.69 09/28/18   [name], NP  Lancet Devices (ONE TOUCH DELICA LANCING DEV) MISC Check blood sugar 2 x daily as directed DX: 250.02 09/28/18   [name], NP    Family History Family History  Problem Relation Age of Onset  . Cancer Mother   . Colon cancer Neg Hx   . Rectal cancer Neg Hx   . Stomach cancer Neg Hx   . Esophageal cancer Neg Hx     Social History Social History   Tobacco Use  . Smoking status: Former Smoker    Packs/day: 0.50    Years: 3.00    Pack years: 1.50    Types: Cigarettes    Quit date: 06/03/1990    Years since quitting: 30.2  . Smokeless tobacco: Never Used  Vaping Use  . Vaping Use: Never used  Substance Use Topics  . Alcohol use: Yes    Alcohol/week: 4.0 standard drinks    Types: 4 Standard drinks or equivalent per week    Comment: Beer off/on   . Drug use: Never     Allergies   Hydrocodone and Oxycodone   Review of Systems Review of Systems See HPI  Physical Exam Triage Vital Signs ED Triage Vitals  Enc Vitals Group     BP 08/27/20 1118 (!) 141/84     Pulse Rate 08/27/20 1118 75     Resp 08/27/20 1118 16     Temp 08/27/20 1118 98.2 F (36.8 C)     Temp Source 08/27/20 1118 Oral     SpO2 08/27/20 1118 96 %     Weight --      Height --      Head Circumference --      Peak Flow --      Pain Score 08/27/20 1123 0     Pain Loc --      Pain Edu? --      Excl. in GC? --    No data found.  Updated Vital Signs BP (!) 141/84 (BP Location: Left Arm)   Pulse 75   Temp 98.2 F (36.8 C) (Oral)   Resp 16   SpO2 96%     Physical Exam Constitutional:      General: He is not in acute distress.    Appearance: He is well-developed.  HENT:     Head: Normocephalic and atraumatic.  Eyes:      Conjunctiva/sclera: Conjunctivae normal.     Pupils: Pupils are equal, round, and reactive to light.  Cardiovascular:     Rate and Rhythm: Normal rate.  Pulmonary:  Effort: Pulmonary effort is normal. No respiratory distress.  Abdominal:     General: There is no distension.     Palpations: Abdomen is soft.  Musculoskeletal:        General: Normal range of motion.     Cervical back: Normal range of motion.  Skin:    General: Skin is warm and dry.     Comments: "C" shaped laceration at the tip of the long finger that extends right up to the nail, but does not damage the nail.  It is 12 mm long.  It still has some seepage but not actively bleeding.  The area is soaked and cleansed with Hibiclens.  Pain with Betadine and then pulled closed with a Steri-Strip.  Wound care as discussed  Neurological:     Mental Status: He is alert.      UC Treatments / Results  Labs (all labs ordered are listed, but only abnormal results are displayed) Labs Reviewed - No data to display  EKG   Radiology No results found.  Procedures Procedures (including critical care time)  Medications Ordered in UC Medications - No data to display  Initial Impression / Assessment and Plan / UC Course  I have reviewed the triage vital signs and the nursing notes.  Pertinent labs & imaging results that were available during my care of the patient were reviewed by me and considered in my medical decision making (see chart for details).     Discussed wound care.  Signs of infection.  Reasons for return Final Clinical Impressions(s) / UC Diagnoses   Final diagnoses:  Laceration of right middle finger without foreign body without damage to nail, initial encounter     Discharge Instructions     Try to keep clean and reasonably dry.  If it gets briefly wet, pat dry.  Do not soak Try to keep tape on for at least 5 to 7 days Put extra tape on if it starts to left before that I am going to call in an  antibiotic to take for 3 days.  This will help prevent infection from setting in If you have any questions or problems please come back for recheck   ED Prescriptions    Medication Sig Dispense Auth. Provider   cephALEXin (KEFLEX) 500 MG capsule Take 1 capsule (500 mg total) by mouth 3 (three) times daily. 9 capsule Eustace Moore, MD     PDMP not reviewed this encounter.   Eustace Moore, MD 08/27/20 [phone]

## 2020-08-27 NOTE — ED Triage Notes (Signed)
Sliced tip of finger below nail w/ pocket knife yesterday at 10am Took dressing off today and continued to bleed Tdap 1 year ago

## 2020-09-05 ENCOUNTER — Other Ambulatory Visit: Payer: Self-pay | Admitting: Nurse Practitioner

## 2020-09-05 DIAGNOSIS — E669 Obesity, unspecified: Secondary | ICD-10-CM

## 2020-09-05 DIAGNOSIS — E1169 Type 2 diabetes mellitus with other specified complication: Secondary | ICD-10-CM

## 2020-10-15 ENCOUNTER — Other Ambulatory Visit: Payer: Self-pay | Admitting: Nurse Practitioner

## 2020-10-15 DIAGNOSIS — E1169 Type 2 diabetes mellitus with other specified complication: Secondary | ICD-10-CM

## 2020-10-15 DIAGNOSIS — E669 Obesity, unspecified: Secondary | ICD-10-CM

## 2020-11-13 ENCOUNTER — Other Ambulatory Visit: Payer: Self-pay | Admitting: Nurse Practitioner

## 2020-11-13 DIAGNOSIS — I10 Essential (primary) hypertension: Secondary | ICD-10-CM

## 2020-11-13 DIAGNOSIS — R748 Abnormal levels of other serum enzymes: Secondary | ICD-10-CM

## 2020-11-13 DIAGNOSIS — E785 Hyperlipidemia, unspecified: Secondary | ICD-10-CM

## 2020-11-13 DIAGNOSIS — E1169 Type 2 diabetes mellitus with other specified complication: Secondary | ICD-10-CM

## 2020-11-13 NOTE — Telephone Encounter (Signed)
Side note: Patient has an appointment in August 2022 to get labs for A1c and Lipid

## 2020-11-26 ENCOUNTER — Other Ambulatory Visit: Payer: Self-pay | Admitting: Nurse Practitioner

## 2020-11-26 DIAGNOSIS — E1169 Type 2 diabetes mellitus with other specified complication: Secondary | ICD-10-CM

## 2020-12-13 LAB — PSA: PSA: 0.23

## 2021-01-01 ENCOUNTER — Other Ambulatory Visit: Payer: Managed Care, Other (non HMO)

## 2021-01-01 DIAGNOSIS — I10 Essential (primary) hypertension: Secondary | ICD-10-CM

## 2021-01-01 DIAGNOSIS — E785 Hyperlipidemia, unspecified: Secondary | ICD-10-CM

## 2021-01-01 DIAGNOSIS — R748 Abnormal levels of other serum enzymes: Secondary | ICD-10-CM

## 2021-01-01 DIAGNOSIS — E669 Obesity, unspecified: Secondary | ICD-10-CM

## 2021-01-05 ENCOUNTER — Encounter: Payer: Self-pay | Admitting: Nurse Practitioner

## 2021-01-05 ENCOUNTER — Ambulatory Visit (INDEPENDENT_AMBULATORY_CARE_PROVIDER_SITE_OTHER): Payer: Managed Care, Other (non HMO) | Admitting: Nurse Practitioner

## 2021-01-05 ENCOUNTER — Other Ambulatory Visit: Payer: Self-pay

## 2021-01-05 VITALS — BP 128/90 | HR 86 | Temp 97.0°F | Ht 69.0 in | Wt 227.0 lb

## 2021-01-05 DIAGNOSIS — I10 Essential (primary) hypertension: Secondary | ICD-10-CM | POA: Diagnosis not present

## 2021-01-05 DIAGNOSIS — E785 Hyperlipidemia, unspecified: Secondary | ICD-10-CM | POA: Diagnosis not present

## 2021-01-05 DIAGNOSIS — Z6835 Body mass index (BMI) 35.0-35.9, adult: Secondary | ICD-10-CM

## 2021-01-05 DIAGNOSIS — E1169 Type 2 diabetes mellitus with other specified complication: Secondary | ICD-10-CM

## 2021-01-05 DIAGNOSIS — E669 Obesity, unspecified: Secondary | ICD-10-CM

## 2021-01-05 DIAGNOSIS — E119 Type 2 diabetes mellitus without complications: Secondary | ICD-10-CM

## 2021-01-05 DIAGNOSIS — N400 Enlarged prostate without lower urinary tract symptoms: Secondary | ICD-10-CM

## 2021-01-05 NOTE — Progress Notes (Signed)
Careteam: Patient Care Team: [name], NP as PCP - General (Nurse Practitioner) [name], OD as Consulting Physician (Optometry) [name], MD (Inactive) as Consulting Physician (Urology)  PLACE OF SERVICE:  [facility]  Advanced Directive information Does Patient Have a Medical Advance Directive?: Yes, Type of Advance Directive: Healthcare Power of [place];Living will, Does patient want to make changes to medical advance directive?: No - Patient declined  Allergies  Allergen Reactions   Hydrocodone Nausea And Vomiting   Oxycodone Nausea And Vomiting    Chief Complaint  Patient presents with   Medical Management of Chronic Issues    6 month follow-up and fasting labs. Discuss need for A1c, covid #4 and flu vaccine or exclude      HPI: Patient is a 60 y.o. male for routine follow up.   Making healthier choices for lunch. Lost weight. Continues to be active at work.   Does not check blood sugar at home. No signs of low blood sugar. Keeping up with regular eye appts.   Overall doing well. No acute concerns.     Review of Systems:  Review of Systems  Constitutional:  Negative for chills, fever and weight loss.  HENT:  Negative for tinnitus.   Respiratory:  Negative for cough, sputum production and shortness of breath.   Cardiovascular:  Negative for chest pain, palpitations and leg swelling.  Gastrointestinal:  Negative for abdominal pain, constipation, diarrhea and heartburn.  Genitourinary:  Negative for dysuria, frequency and urgency.  Musculoskeletal:  Negative for back pain, falls, joint pain and myalgias.  Skin: Negative.   Neurological:  Negative for dizziness and headaches.  Psychiatric/Behavioral:  Negative for depression and memory loss. The patient does not have insomnia.    Past Medical History:  Diagnosis Date   Fatty liver    followed by dr [name]--- mild elevated LFT since 2015,  blood work-up done 05/ 2020 (epic), and abd.  ultrasound 10-16-2018 (epic),  recommendation loss weight   History of kidney stones    Hyperlipidemia    Hypertension    Seasonal allergies    Type 2 diabetes mellitus (HCC)    followed by pcp--  fasting cbg-- 140;  had been checking blood surgar past few weeks   Ureteral calculi    right   Wears contact lenses    Past Surgical History:  Procedure Laterality Date   COLONOSCOPY WITH PROPOFOL  03-13-2015  dr [name]   CYSTOSCOPY WITH HOLMIUM LASER LITHOTRIPSY  12-23-2003   dr Vernie Ammons  @[facility]    CYSTOSCOPY/RETROGRADE/URETEROSCOPY/STONE EXTRACTION WITH BASKET Right 03/26/2019   Procedure: CYSTOSCOPY/RETROGRADE/URETEROSCOPY/STONE EXTRACTION WITH BASKET/ STENT PLACEMENT;  Surgeon: 03/28/2019, MD;  [facility];  Service: Urology;  Laterality: Right;   EXTRACORPOREAL SHOCK WAVE LITHOTRIPSY  11-05-2012   @[facility]    TONSILLECTOMY  child   Social History:   reports that he quit smoking about 30 years ago. His smoking use included cigarettes. He has a 1.50 pack-year smoking history. He has never used smokeless tobacco. He reports current alcohol use of about 4.0 standard drinks of alcohol per week. He reports that he does not use drugs.  Family History  Problem Relation Age of Onset   Cancer Mother    Cataracts Father    Colon cancer Neg Hx    Rectal cancer Neg Hx    Stomach cancer Neg Hx    Esophageal cancer Neg Hx     Medications: Patient's Medications  New Prescriptions   No medications on file  Previous Medications   ASPIRIN 81 MG TABLET    Take 81 mg by mouth daily.   ATORVASTATIN (LIPITOR) 20 MG TABLET    TAKE 1 TABLET BY MOUTH EVERY DAY   CETIRIZINE (ZYRTEC) 10 MG TABLET    Take 10 mg by mouth daily.   FLUTICASONE (FLONASE) 50 MCG/ACT NASAL SPRAY    Place 1 spray into both nostrils as needed for allergies or rhinitis.   GLUCOSE BLOOD (ONETOUCH VERIO) TEST STRIP    Use to test blood sugar twice daily Dx: E11.69   IBUPROFEN (ADVIL) 200 MG TABLET    Take 200 mg  by mouth every 6 (six) hours as needed.   JARDIANCE 25 MG TABS TABLET    TAKE 1 TABLET (25 MG TOTAL) BY MOUTH DAILY.   LANCET DEVICES (ONE TOUCH DELICA LANCING DEV) MISC    Check blood sugar 2 x daily as directed DX: 250.02   LISINOPRIL (ZESTRIL) 10 MG TABLET    TAKE 1 TABLET BY MOUTH EVERY DAY   METFORMIN (GLUCOPHAGE) 1000 MG TABLET    TAKE 1 TABLET BY MOUTH 2 TIMES DAILY WITH A MEAL.   NAPROXEN SODIUM (ALEVE) 220 MG TABLET    Take 220 mg by mouth as needed.   PIOGLITAZONE (ACTOS) 15 MG TABLET    TAKE 1 TABLET BY MOUTH EVERY DAY   POTASSIUM CITRATE (UROCIT-K) 10 MEQ (1080 MG) SR TABLET    at bedtime. 2 by mouth once daily to prevent kidney stones   TRULICITY 1.5 MG/0.5ML SOPN    INJECT 1.5MG  INTO THE SKIN ONCE A WEEK.  Modified Medications   No medications on file  Discontinued Medications   CEPHALEXIN (KEFLEX) 500 MG CAPSULE    Take 1 capsule (500 mg total) by mouth 3 (three) times daily.    Physical Exam:  Vitals:   01/05/21 0827  BP: 128/90  Pulse: 86  Temp: (!) 97 F (36.1 C)  TempSrc: Temporal  SpO2: 96%  Weight: 227 lb (103 kg)  Height: 5\' 9"  (1.753 m)   Body mass index is 33.52 kg/m. Wt Readings from Last 3 Encounters:  01/05/21 227 lb (103 kg)  07/03/20 235 lb (106.6 kg)  04/12/20 235 lb (106.6 kg)    Physical Exam Constitutional:      General: He is not in acute distress.    Appearance: He is well-developed. He is not diaphoretic.  HENT:     Head: Normocephalic and atraumatic.     Right Ear: External ear normal.     Left Ear: External ear normal.     Mouth/Throat:     Pharynx: No oropharyngeal exudate.  Eyes:     Conjunctiva/sclera: Conjunctivae normal.     Pupils: Pupils are equal, round, and reactive to light.  Cardiovascular:     Rate and Rhythm: Normal rate and regular rhythm.     Heart sounds: Normal heart sounds.  Pulmonary:     Effort: Pulmonary effort is normal.     Breath sounds: Normal breath sounds.  Abdominal:     General: Bowel sounds are  normal.     Palpations: Abdomen is soft.  Musculoskeletal:        General: No tenderness.     Cervical back: Normal range of motion and neck supple.     Right lower leg: No edema.     Left lower leg: No edema.  Skin:    General: Skin is warm and dry.  Neurological:     Mental Status: He is alert  and oriented to person, place, and time.    Labs reviewed: Basic Metabolic Panel: Recent Labs    06/21/20 0825  NA 139  K 4.3  CL 103  CO2 27  GLUCOSE 136*  BUN 17  CREATININE 0.91  CALCIUM 9.7   Liver Function Tests: Recent Labs    06/21/20 0825  AST 28  ALT 33  BILITOT 1.0  PROT 7.1   No results for input(s): LIPASE, AMYLASE in the last 8760 hours. No results for input(s): AMMONIA in the last 8760 hours. CBC: Recent Labs    06/21/20 0825  WBC 7.8  NEUTROABS 4,758  HGB 15.3  HCT 45.1  MCV 89.0  PLT 240   Lipid Panel: Recent Labs    06/21/20 0825  CHOL 141  HDL 35*  LDLCALC 74  TRIG 712*  CHOLHDL 4.0   TSH: No results for input(s): TSH in the last 8760 hours. A1C: Lab Results  Component Value Date   HGBA1C 6.8 (H) 06/21/2020     Assessment/Plan 1. Diabetes mellitus type 2 in obese (HCC) Encouraged dietary compliance, routine foot care/monitoring and to keep up with diabetic eye exams through ophthalmology  -continue medication as prescribed -a1c today  2. Class 2 severe obesity with serious comorbidity and body mass index (BMI) of 35.0 to 35.9 in adult, unspecified obesity type (HCC) -weight loss noted and congratulated today. He continues to work on dietary modifications   3. Hyperlipidemia LDL goal <70 --continues on lipitor. LDL at goal. Continue dietary modifications with medication management.    4. Essential hypertension -stable. Goal bp <140/90. Continue on liisnopril with low sodium diet.   5. Benign prostatic hyperplasia without lower urinary tract symptoms -controlled at this time. Without worsening of symptoms. PSA stable.  Followed by urology.   Next appt: 6 months, labs prior Johnie Stadel K. Biagio Borg  [facility] [phone]

## 2021-01-06 LAB — CBC WITH DIFFERENTIAL/PLATELET
Absolute Monocytes: 552 cells/uL (ref 200–950)
Basophils Absolute: 110 cells/uL (ref 0–200)
Basophils Relative: 1.2 %
Eosinophils Absolute: 258 cells/uL (ref 15–500)
Eosinophils Relative: 2.8 %
HCT: 48.3 % (ref 38.5–50.0)
Hemoglobin: 16 g/dL (ref 13.2–17.1)
Lymphs Abs: 2337 cells/uL (ref 850–3900)
MCH: 29.9 pg (ref 27.0–33.0)
MCHC: 33.1 g/dL (ref 32.0–36.0)
MCV: 90.3 fL (ref 80.0–100.0)
MPV: 9.5 fL (ref 7.5–12.5)
Monocytes Relative: 6 %
Neutro Abs: 5943 cells/uL (ref 1500–7800)
Neutrophils Relative %: 64.6 %
Platelets: 291 10*3/uL (ref 140–400)
RBC: 5.35 10*6/uL (ref 4.20–5.80)
RDW: 12.8 % (ref 11.0–15.0)
Total Lymphocyte: 25.4 %
WBC: 9.2 10*3/uL (ref 3.8–10.8)

## 2021-01-06 LAB — LIPID PANEL
Cholesterol: 158 mg/dL (ref <200)
HDL: 35 mg/dL — ABNORMAL LOW (ref >=40)
LDL Cholesterol (Calc): 84 mg/dL (calc)
Non-HDL Cholesterol (Calc): 123 mg/dL (calc) (ref <130)
Total CHOL/HDL Ratio: 4.5 (calc) (ref <5.0)
Triglycerides: 320 mg/dL — ABNORMAL HIGH (ref <150)

## 2021-01-06 LAB — COMPLETE METABOLIC PANEL WITH GFR
AG Ratio: 1.7 (calc) (ref 1.0–2.5)
ALT: 38 U/L (ref 9–46)
AST: 32 U/L (ref 10–35)
Albumin: 4.7 g/dL (ref 3.6–5.1)
Alkaline phosphatase (APISO): 86 U/L (ref 35–144)
BUN: 17 mg/dL (ref 7–25)
CO2: 24 mmol/L (ref 20–32)
Calcium: 9.9 mg/dL (ref 8.6–10.3)
Chloride: 100 mmol/L (ref 98–110)
Creat: 0.9 mg/dL (ref 0.70–1.30)
Globulin: 2.7 g/dL (calc) (ref 1.9–3.7)
Glucose, Bld: 113 mg/dL — ABNORMAL HIGH (ref 65–99)
Potassium: 4.4 mmol/L (ref 3.5–5.3)
Sodium: 138 mmol/L (ref 135–146)
Total Bilirubin: 1 mg/dL (ref 0.2–1.2)
Total Protein: 7.4 g/dL (ref 6.1–8.1)
eGFR: 98 mL/min/{1.73_m2} (ref >=60)

## 2021-01-06 LAB — HEMOGLOBIN A1C
Hgb A1c MFr Bld: 6.8 % of total Hgb — ABNORMAL HIGH (ref <5.7)
Mean Plasma Glucose: 148 mg/dL
eAG (mmol/L): 8.2 mmol/L

## 2021-01-08 ENCOUNTER — Other Ambulatory Visit: Payer: Self-pay | Admitting: Nurse Practitioner

## 2021-01-08 DIAGNOSIS — E785 Hyperlipidemia, unspecified: Secondary | ICD-10-CM

## 2021-01-08 MED ORDER — FENOFIBRATE 120 MG PO TABS: 120.0000 mg | ORAL_TABLET | Freq: Every day | ORAL | 3 refills | Status: DC

## 2021-01-09 DIAGNOSIS — E785 Hyperlipidemia, unspecified: Secondary | ICD-10-CM

## 2021-01-12 MED ORDER — FENOFIBRATE 145 MG PO TABS: 145.0000 mg | ORAL_TABLET | Freq: Every day | ORAL | 1 refills | Status: DC

## 2021-01-12 NOTE — Addendum Note (Signed)
Addended by: [name] on: 01/12/2021 12:18 PM   Modules accepted: Orders

## 2021-01-12 NOTE — Telephone Encounter (Signed)
Message routed to Eubanks, Jessica K, NP  

## 2021-02-11 ENCOUNTER — Other Ambulatory Visit: Payer: Self-pay | Admitting: Nurse Practitioner

## 2021-02-11 DIAGNOSIS — E669 Obesity, unspecified: Secondary | ICD-10-CM

## 2021-02-11 DIAGNOSIS — E1169 Type 2 diabetes mellitus with other specified complication: Secondary | ICD-10-CM

## 2021-02-11 DIAGNOSIS — R748 Abnormal levels of other serum enzymes: Secondary | ICD-10-CM

## 2021-02-11 DIAGNOSIS — E785 Hyperlipidemia, unspecified: Secondary | ICD-10-CM

## 2021-03-15 ENCOUNTER — Other Ambulatory Visit: Payer: Self-pay | Admitting: Nurse Practitioner

## 2021-03-15 DIAGNOSIS — E1169 Type 2 diabetes mellitus with other specified complication: Secondary | ICD-10-CM

## 2021-04-13 ENCOUNTER — Other Ambulatory Visit: Payer: Self-pay | Admitting: Nurse Practitioner

## 2021-04-13 DIAGNOSIS — E1169 Type 2 diabetes mellitus with other specified complication: Secondary | ICD-10-CM

## 2021-05-15 ENCOUNTER — Other Ambulatory Visit: Payer: Self-pay | Admitting: Nurse Practitioner

## 2021-05-15 DIAGNOSIS — E1169 Type 2 diabetes mellitus with other specified complication: Secondary | ICD-10-CM

## 2021-06-29 ENCOUNTER — Other Ambulatory Visit: Payer: Managed Care, Other (non HMO)

## 2021-06-29 ENCOUNTER — Other Ambulatory Visit: Payer: Self-pay

## 2021-06-29 DIAGNOSIS — I10 Essential (primary) hypertension: Secondary | ICD-10-CM

## 2021-06-29 DIAGNOSIS — E785 Hyperlipidemia, unspecified: Secondary | ICD-10-CM

## 2021-06-29 DIAGNOSIS — E1169 Type 2 diabetes mellitus with other specified complication: Secondary | ICD-10-CM

## 2021-06-30 LAB — HEMOGLOBIN A1C
Hgb A1c MFr Bld: 7.5 % of total Hgb — ABNORMAL HIGH (ref <5.7)
Mean Plasma Glucose: 169 mg/dL
eAG (mmol/L): 9.3 mmol/L

## 2021-06-30 LAB — LIPID PANEL
Cholesterol: 136 mg/dL (ref <200)
HDL: 30 mg/dL — ABNORMAL LOW (ref >=40)
LDL Cholesterol (Calc): 75 mg/dL (calc)
Non-HDL Cholesterol (Calc): 106 mg/dL (calc) (ref <130)
Total CHOL/HDL Ratio: 4.5 (calc) (ref <5.0)
Triglycerides: 225 mg/dL — ABNORMAL HIGH (ref <150)

## 2021-06-30 LAB — COMPLETE METABOLIC PANEL WITH GFR
AG Ratio: 1.7 (calc) (ref 1.0–2.5)
ALT: 67 U/L — ABNORMAL HIGH (ref 9–46)
AST: 54 U/L — ABNORMAL HIGH (ref 10–35)
Albumin: 4.4 g/dL (ref 3.6–5.1)
Alkaline phosphatase (APISO): 72 U/L (ref 35–144)
BUN: 20 mg/dL (ref 7–25)
CO2: 25 mmol/L (ref 20–32)
Calcium: 9.7 mg/dL (ref 8.6–10.3)
Chloride: 102 mmol/L (ref 98–110)
Creat: 1.04 mg/dL (ref 0.70–1.35)
Globulin: 2.6 g/dL (calc) (ref 1.9–3.7)
Glucose, Bld: 128 mg/dL — ABNORMAL HIGH (ref 65–99)
Potassium: 4.5 mmol/L (ref 3.5–5.3)
Sodium: 139 mmol/L (ref 135–146)
Total Bilirubin: 1 mg/dL (ref 0.2–1.2)
Total Protein: 7 g/dL (ref 6.1–8.1)
eGFR: 82 mL/min/{1.73_m2} (ref >=60)

## 2021-06-30 LAB — CBC WITH DIFFERENTIAL/PLATELET
Absolute Monocytes: 511 cells/uL (ref 200–950)
Basophils Absolute: 80 cells/uL (ref 0–200)
Basophils Relative: 1.1 %
Eosinophils Absolute: 219 cells/uL (ref 15–500)
Eosinophils Relative: 3 %
HCT: 47.2 % (ref 38.5–50.0)
Hemoglobin: 16 g/dL (ref 13.2–17.1)
Lymphs Abs: 2022 cells/uL (ref 850–3900)
MCH: 30.5 pg (ref 27.0–33.0)
MCHC: 33.9 g/dL (ref 32.0–36.0)
MCV: 89.9 fL (ref 80.0–100.0)
MPV: 9.9 fL (ref 7.5–12.5)
Monocytes Relative: 7 %
Neutro Abs: 4468 cells/uL (ref 1500–7800)
Neutrophils Relative %: 61.2 %
Platelets: 273 10*3/uL (ref 140–400)
RBC: 5.25 10*6/uL (ref 4.20–5.80)
RDW: 12.4 % (ref 11.0–15.0)
Total Lymphocyte: 27.7 %
WBC: 7.3 10*3/uL (ref 3.8–10.8)

## 2021-07-02 ENCOUNTER — Other Ambulatory Visit: Payer: Managed Care, Other (non HMO)

## 2021-07-04 ENCOUNTER — Other Ambulatory Visit: Payer: Self-pay | Admitting: Nurse Practitioner

## 2021-07-04 DIAGNOSIS — E785 Hyperlipidemia, unspecified: Secondary | ICD-10-CM

## 2021-07-06 ENCOUNTER — Encounter: Payer: Self-pay | Admitting: Nurse Practitioner

## 2021-07-06 ENCOUNTER — Ambulatory Visit (INDEPENDENT_AMBULATORY_CARE_PROVIDER_SITE_OTHER): Payer: Managed Care, Other (non HMO) | Admitting: Nurse Practitioner

## 2021-07-06 ENCOUNTER — Other Ambulatory Visit: Payer: Self-pay

## 2021-07-06 VITALS — BP 112/76 | HR 76 | Temp 97.6°F | Ht 69.0 in | Wt 235.0 lb

## 2021-07-06 DIAGNOSIS — J309 Allergic rhinitis, unspecified: Secondary | ICD-10-CM | POA: Diagnosis not present

## 2021-07-06 DIAGNOSIS — R748 Abnormal levels of other serum enzymes: Secondary | ICD-10-CM

## 2021-07-06 DIAGNOSIS — E1169 Type 2 diabetes mellitus with other specified complication: Secondary | ICD-10-CM

## 2021-07-06 DIAGNOSIS — N400 Enlarged prostate without lower urinary tract symptoms: Secondary | ICD-10-CM

## 2021-07-06 DIAGNOSIS — I10 Essential (primary) hypertension: Secondary | ICD-10-CM

## 2021-07-06 DIAGNOSIS — E669 Obesity, unspecified: Secondary | ICD-10-CM

## 2021-07-06 DIAGNOSIS — Z6835 Body mass index (BMI) 35.0-35.9, adult: Secondary | ICD-10-CM

## 2021-07-06 DIAGNOSIS — E785 Hyperlipidemia, unspecified: Secondary | ICD-10-CM

## 2021-07-06 NOTE — Progress Notes (Signed)
Careteam: Patient Care Team: Lauree Chandler, NP as PCP - General (Nurse Practitioner) Sharol Roussel, [place] as Consulting Physician (Optometry) Kathie Rhodes, MD (Inactive) as Consulting Physician (Urology)  PLACE OF SERVICE:  [place] Directive information Does Patient Have a Medical Advance Directive?: Yes, Type of Advance Directive: [place];Living will, Does patient want to make changes to medical advance directive?: No - Patient declined  Allergies  Allergen Reactions   Hydrocodone Nausea And Vomiting   Oxycodone Nausea And Vomiting    Chief Complaint  Patient presents with   Medical Management of Chronic Issues    6 month follow-up and discuss labs (copy printed and given to patient). Foot exam today. Patient denies receiving any vaccines since last visit.        HPI: Patient is a 61 y.o. male for a 6 month follow up.   Diabetes:- Does not check CBG daily, poor dieting and insufficient exercise.  Hypertension:- Pt takes lisinopril regularly. With no issues with side effects.  Hyperlipidemia: - Aware of the importance of working out and proper diet. Wants to make lifestyle changes.  Diet: high in carbohydrates, low protein   Exercise: Walks at work , about 6500 steps per day.   States he does not consume much alcohol, just a few beers occasionally.   Up to date with eye exams    Review of Systems:  Review of Systems  Constitutional:  Negative for chills, fever and weight loss.  Respiratory:  Negative for cough, shortness of breath and wheezing.   Cardiovascular:  Negative for chest pain, palpitations and leg swelling.  Gastrointestinal:  Negative for abdominal pain, constipation, diarrhea, heartburn, nausea and vomiting.  Genitourinary:  Negative for dysuria, frequency and urgency.  Psychiatric/Behavioral:  Negative for depression. The patient is not nervous/anxious and does not have insomnia.    Past Medical History:   Diagnosis Date   Fatty liver    followed by dr Ardis Hughs--- mild elevated LFT since 2015,  blood work-up done 05/ 2020 (epic), and abd. ultrasound 10-16-2018 (epic),  recommendation loss weight   History of kidney stones    Hyperlipidemia    Hypertension    Seasonal allergies    Type 2 diabetes mellitus ([place])    followed by pcp--  fasting cbg-- 140;  had been checking blood surgar past few weeks   Ureteral calculi    right   Wears contact lenses    Past Surgical History:  Procedure Laterality Date   COLONOSCOPY WITH PROPOFOL  03-13-2015  dr Ardis Hughs   CYSTOSCOPY WITH HOLMIUM LASER LITHOTRIPSY  12-23-2003   dr Karsten Ro  @[facility]    CYSTOSCOPY/RETROGRADE/URETEROSCOPY/STONE EXTRACTION WITH BASKET Right 03/26/2019   Procedure: CYSTOSCOPY/RETROGRADE/URETEROSCOPY/STONE EXTRACTION WITH BASKET/ STENT PLACEMENT;  Surgeon: Kathie Rhodes, MD;  Location: [facility];  Service: Urology;  Laterality: Right;   EXTRACORPOREAL SHOCK WAVE LITHOTRIPSY  11-05-2012   @[facility]    TONSILLECTOMY  child   Social History:   reports that he quit smoking about 31 years ago. His smoking use included cigarettes. He has a 1.50 pack-year smoking history. He has never used smokeless tobacco. He reports current alcohol use of about 4.0 standard drinks per week. He reports that he does not use drugs.  Family History  Problem Relation Age of Onset   Cancer Mother    Cataracts Father    Colon cancer Neg Hx    Rectal cancer Neg Hx    Stomach cancer Neg Hx    Esophageal cancer  Neg Hx     Medications: Patient's Medications  New Prescriptions   No medications on file  Previous Medications   ASPIRIN 81 MG TABLET    Take 81 mg by mouth daily.   ATORVASTATIN (LIPITOR) 20 MG TABLET    TAKE 1 TABLET BY MOUTH EVERY DAY   CETIRIZINE (ZYRTEC) 10 MG TABLET    Take 10 mg by mouth daily.   DULAGLUTIDE (TRULICITY) 1.5 0000000 SOPN    INJECT 1.5MG  DOSE INTO THE SKIN ONCE WEEKLY   EMPAGLIFLOZIN (JARDIANCE) 25 MG TABS TABLET     TAKE 1 TABLET (25 MG TOTAL) BY MOUTH DAILY.   FENOFIBRATE (TRICOR) 145 MG TABLET    Take 1 tablet (145 mg total) by mouth daily. (E78.5)   FLUTICASONE (FLONASE) 50 MCG/ACT NASAL SPRAY    Place 1 spray into both nostrils as needed for allergies or rhinitis.   GLUCOSE BLOOD (ONETOUCH VERIO) TEST STRIP    Use to test blood sugar twice daily Dx: E11.69   IBUPROFEN (ADVIL) 200 MG TABLET    Take 200 mg by mouth every 6 (six) hours as needed.   LANCET DEVICES (ONE TOUCH DELICA LANCING DEV) MISC    Check blood sugar 2 x daily as directed DX: 250.02   LISINOPRIL (ZESTRIL) 10 MG TABLET    TAKE 1 TABLET BY MOUTH EVERY DAY   METFORMIN (GLUCOPHAGE) 1000 MG TABLET    TAKE 1 TABLET BY MOUTH 2 TIMES DAILY WITH A MEAL.   NAPROXEN SODIUM (ALEVE) 220 MG TABLET    Take 220 mg by mouth as needed.   PIOGLITAZONE (ACTOS) 15 MG TABLET    TAKE 1 TABLET BY MOUTH EVERY DAY   POTASSIUM CITRATE (UROCIT-K) 10 MEQ (1080 MG) SR TABLET    at bedtime. 2 by mouth once daily to prevent kidney stones  Modified Medications   No medications on file  Discontinued Medications   No medications on file    Physical Exam:  Vitals:   07/06/21 0818  BP: 112/76  Pulse: 76  Temp: 97.6 F (36.4 C)  TempSrc: Temporal  SpO2: 96%  Weight: 235 lb (106.6 kg)  Height: 5\' 9"  (1.753 m)   Body mass index is 34.7 kg/m. Wt Readings from Last 3 Encounters:  07/06/21 235 lb (106.6 kg)  01/05/21 227 lb (103 kg)  07/03/20 235 lb (106.6 kg)    Physical Exam Constitutional:      General: He is not in acute distress.    Appearance: Normal appearance. He is obese. He is not ill-appearing.  HENT:     Right Ear: Tympanic membrane, ear canal and external ear normal.     Left Ear: Tympanic membrane, ear canal and external ear normal.  Eyes:     Conjunctiva/sclera: Conjunctivae normal.  Cardiovascular:     Rate and Rhythm: Normal rate and regular rhythm.     Pulses: Normal pulses.          Dorsalis pedis pulses are 2+ on the right side  and 2+ on the left side.       Posterior tibial pulses are 2+ on the left side.     Heart sounds: Normal heart sounds.  Pulmonary:     Effort: Pulmonary effort is normal.  Abdominal:     General: Bowel sounds are normal.     Tenderness: There is no abdominal tenderness.  Musculoskeletal:        General: Normal range of motion.       Feet:  Feet:  Right foot:     Protective Sensation: 5 sites tested.  5 sites sensed.     Skin integrity: No ulcer, blister, skin breakdown or erythema.     Toenail Condition: Right toenails are normal.     Left foot:     Protective Sensation: 5 sites tested.  5 sites sensed.     Skin integrity: No ulcer, blister, skin breakdown or erythema.     Toenail Condition: Left toenails are normal.  Skin:    General: Skin is warm.  Neurological:     Mental Status: He is alert and oriented to person, place, and time.  Psychiatric:        Mood and Affect: Mood normal.        Behavior: Behavior normal.        Thought Content: Thought content normal.        Judgment: Judgment normal.    Labs reviewed: Basic Metabolic Panel: Recent Labs    01/05/21 0901 06/29/21 0816  NA 138 139  K 4.4 4.5  CL 100 102  CO2 24 25  GLUCOSE 113* 128*  BUN 17 20  CREATININE 0.90 1.04  CALCIUM 9.9 9.7   Liver Function Tests: Recent Labs    01/05/21 0901 06/29/21 0816  AST 32 54*  ALT 38 67*  BILITOT 1.0 1.0  PROT 7.4 7.0   No results for input(s): LIPASE, AMYLASE in the last 8760 hours. No results for input(s): AMMONIA in the last 8760 hours. CBC: Recent Labs    01/05/21 0901 06/29/21 0816  WBC 9.2 7.3  NEUTROABS 5,943 4,468  HGB 16.0 16.0  HCT 48.3 47.2  MCV 90.3 89.9  PLT 291 273   Lipid Panel: Recent Labs    01/05/21 0901 06/29/21 0816  CHOL 158 136  HDL 35* 30*  LDLCALC 84 75  TRIG 320* 225*  CHOLHDL 4.5 4.5   TSH: No results for input(s): TSH in the last 8760 hours. A1C: Lab Results  Component Value Date   HGBA1C 7.5 (H)  06/29/2021     Assessment/Plan  1. Essential hypertension - Bp today 112/76  -Stable, on lisinopril 10mg  QD  -Encouraged patient to check bp daily, continue medication and encouraged lifestyle modifications.   2. Allergic rhinitis, unspecified seasonality, unspecified trigger -Condition is controlled does nasal flushes with saline solution (sinus rinse).  - Takes Flonase PRN -Takes zyrtec 10 mg QD   3. Diabetes mellitus type 2 in obese (HCC) -Consistently takes medications as directed.  -Diabetes not at goal, on trulicity 1.5 , Jardiance 2mg  qd, actos 15 mg qd, meformin 1000mg  BID. -1/27/ 23 HBG A1C 7.5, previous 01/05/21 HBG A1C 6.5  -Encouraged to try mediterranean diet.  -Educated about the importance of physical activities such weight bearing exercise.  -Encouraged patient to monitor glucose daily - discussed with the patient the pathophysiology of diabetes and the natural progression of the disease.  -stressed the importance of lifestyle changes including diet and exercise. -discussed complications associated with diabetes including retinopathy, nephropathy, neuropathy as well as increased risk of cardiovascular disease. We went over the benefit seen with glycemic control.    4. Benign prostatic hyperplasia without lower urinary tract symptoms - Stable, denies issues with bladder function -followed by urologist  5. Hyperlipidemia LDL goal <70 - 01/05/21 triglycerides 225 and previously 320 on 01/05/21  -Takes fenofibrate 145 mg QD, takes medication daily -LDL 75, has improved.  -HDL 30, worsen previous HDL was 35 01/05/21 -Encouraged strict diet modification and weight bearing exercises  at least 150 minutes per week.   6. Class 2 severe obesity with serious comorbidity and body mass index (BMI) of 35.0 to 35.9 in adult, unspecified obesity type ([place]) -07/06/21 weight 235 lbs, previous 01/05/21 weight 227 lbs -Encouraged patient to lose weight and make life styles changes,  increase physical activity and dietary changes.  7. Elevated liver enzymes -slightly worse on recent labs than previous but overall stable - AST 54 -ALT 67 -Patients he does not consume alcohol regularly, encouraged low fat diet.  -Will continue to monitor.    Next appt: Return in about 3 months (around 10/03/2021) for routine follow up, labs prior to visit. I personally was present during the history, physical exam and medical decision-making activities of this service and have verified that the service and findings are accurately documented in the students note  Janett Billow K. [place], [facility] [phone]

## 2021-07-11 ENCOUNTER — Encounter: Payer: Self-pay | Admitting: *Deleted

## 2021-07-11 LAB — HM DIABETES EYE EXAM

## 2021-07-13 ENCOUNTER — Other Ambulatory Visit: Payer: Self-pay | Admitting: Nurse Practitioner

## 2021-07-13 DIAGNOSIS — I10 Essential (primary) hypertension: Secondary | ICD-10-CM

## 2021-08-10 ENCOUNTER — Other Ambulatory Visit: Payer: Self-pay | Admitting: Nurse Practitioner

## 2021-08-10 DIAGNOSIS — E1169 Type 2 diabetes mellitus with other specified complication: Secondary | ICD-10-CM

## 2021-08-10 DIAGNOSIS — R748 Abnormal levels of other serum enzymes: Secondary | ICD-10-CM

## 2021-08-10 DIAGNOSIS — E785 Hyperlipidemia, unspecified: Secondary | ICD-10-CM

## 2021-08-29 ENCOUNTER — Other Ambulatory Visit: Payer: Self-pay | Admitting: Nurse Practitioner

## 2021-08-29 DIAGNOSIS — E669 Obesity, unspecified: Secondary | ICD-10-CM

## 2021-10-05 ENCOUNTER — Other Ambulatory Visit: Payer: Managed Care, Other (non HMO)

## 2021-10-05 DIAGNOSIS — E669 Obesity, unspecified: Secondary | ICD-10-CM

## 2021-10-05 DIAGNOSIS — E785 Hyperlipidemia, unspecified: Secondary | ICD-10-CM

## 2021-10-05 DIAGNOSIS — I10 Essential (primary) hypertension: Secondary | ICD-10-CM

## 2021-10-10 ENCOUNTER — Other Ambulatory Visit: Payer: Managed Care, Other (non HMO)

## 2021-10-11 LAB — HEMOGLOBIN A1C
Hgb A1c MFr Bld: 8 % of total Hgb — ABNORMAL HIGH (ref <5.7)
Mean Plasma Glucose: 183 mg/dL
eAG (mmol/L): 10.1 mmol/L

## 2021-10-11 LAB — COMPLETE METABOLIC PANEL WITH GFR
AG Ratio: 1.6 (calc) (ref 1.0–2.5)
ALT: 57 U/L — ABNORMAL HIGH (ref 9–46)
AST: 52 U/L — ABNORMAL HIGH (ref 10–35)
Albumin: 4.6 g/dL (ref 3.6–5.1)
Alkaline phosphatase (APISO): 69 U/L (ref 35–144)
BUN: 19 mg/dL (ref 7–25)
CO2: 28 mmol/L (ref 20–32)
Calcium: 10.2 mg/dL (ref 8.6–10.3)
Chloride: 100 mmol/L (ref 98–110)
Creat: 1.18 mg/dL (ref 0.70–1.35)
Globulin: 2.9 g/dL (calc) (ref 1.9–3.7)
Glucose, Bld: 140 mg/dL — ABNORMAL HIGH (ref 65–99)
Potassium: 4.9 mmol/L (ref 3.5–5.3)
Sodium: 139 mmol/L (ref 135–146)
Total Bilirubin: 0.8 mg/dL (ref 0.2–1.2)
Total Protein: 7.5 g/dL (ref 6.1–8.1)
eGFR: 71 mL/min/{1.73_m2} (ref >=60)

## 2021-10-11 LAB — LIPID PANEL
Cholesterol: 138 mg/dL (ref <200)
HDL: 33 mg/dL — ABNORMAL LOW (ref >=40)
LDL Cholesterol (Calc): 77 mg/dL (calc)
Non-HDL Cholesterol (Calc): 105 mg/dL (calc) (ref <130)
Total CHOL/HDL Ratio: 4.2 (calc) (ref <5.0)
Triglycerides: 188 mg/dL — ABNORMAL HIGH (ref <150)

## 2021-10-11 LAB — CBC WITH DIFFERENTIAL/PLATELET
Absolute Monocytes: 453 cells/uL (ref 200–950)
Basophils Absolute: 68 cells/uL (ref 0–200)
Basophils Relative: 1.1 %
Eosinophils Absolute: 180 cells/uL (ref 15–500)
Eosinophils Relative: 2.9 %
HCT: 46.7 % (ref 38.5–50.0)
Hemoglobin: 15.5 g/dL (ref 13.2–17.1)
Lymphs Abs: 1761 cells/uL (ref 850–3900)
MCH: 29.9 pg (ref 27.0–33.0)
MCHC: 33.2 g/dL (ref 32.0–36.0)
MCV: 90 fL (ref 80.0–100.0)
MPV: 9.7 fL (ref 7.5–12.5)
Monocytes Relative: 7.3 %
Neutro Abs: 3739 cells/uL (ref 1500–7800)
Neutrophils Relative %: 60.3 %
Platelets: 236 10*3/uL (ref 140–400)
RBC: 5.19 10*6/uL (ref 4.20–5.80)
RDW: 12 % (ref 11.0–15.0)
Total Lymphocyte: 28.4 %
WBC: 6.2 10*3/uL (ref 3.8–10.8)

## 2021-10-11 NOTE — Progress Notes (Signed)
? ? ?Careteam: ?Patient Care Team: ?Sharon SellerEubanks, Caysie Minnifield K, NP as PCP - General (Nurse Practitioner) ?Estrella DeedsYoakum, John, OD as Consulting Physician (Optometry) ?Ihor Gullyttelin, Mark, MD (Inactive) as Consulting Physician (Urology) ? ?[facility]  [facility]  ?Advanced Directive information ?Does Patient Have a Medical Advance Directive?: Yes, Type of Advance Directive: Healthcare Power of [place];Living will, Does patient want to make changes to medical advance directive?: No - Patient declined ? ?Allergies  ?Allergen Reactions  ? Hydrocodone Nausea And Vomiting  ? Oxycodone Nausea And Vomiting  ? ? ?Chief Complaint  ?Patient presents with  ? Medical Management of Chronic Issues  ?  3 month follow-up and discuss routine labs (copy printed and given to patient).   ? ? ? ?HPI: Patient is a 61 y.o. male for routine follow up.  ? ?DM- A1c up at last visit, he tried to modify diet but this did not work. He is taking actos, metformin twice daily, Jardiance.  ?He eats a lot of carbohydrates. Bagel for breakfast, sandwiches for lunch (he packs lunch) and chips ? ?Having a hard time sleeping at night. Taking ibuprofen with  ? ?Continues on tricor for triglycerides- improved but at goal ? ?Elevated liver enzymes stable.  ? ?Allergies have been bad this season.  ? ?Review of Systems:  ?Review of Systems  ?Constitutional:  Negative for chills, fever and weight loss.  ?HENT:  Negative for tinnitus.   ?Respiratory:  Negative for cough, sputum production and shortness of breath.   ?Cardiovascular:  Negative for chest pain, palpitations and leg swelling.  ?Gastrointestinal:  Negative for abdominal pain, constipation, diarrhea and heartburn.  ?Genitourinary:  Negative for dysuria, frequency and urgency.  ?Musculoskeletal:  Negative for back pain, falls, joint pain and myalgias.  ?Skin: Negative.   ?Neurological:  Negative for dizziness and headaches.  ?Psychiatric/Behavioral:  Negative for depression and memory loss. The patient has  insomnia.   ? ?Past Medical History:  ?Diagnosis Date  ? Fatty liver   ? followed by dr Christella Hartiganjacobs--- mild elevated LFT since 2015,  blood work-up done 05/ 2020 (epic), and abd. ultrasound 10-16-2018 (epic),  recommendation loss weight  ? History of kidney stones   ? Hyperlipidemia   ? Hypertension   ? Seasonal allergies   ? Type 2 diabetes mellitus (HCC)   ? followed by pcp--  fasting cbg-- 140;  had been checking blood surgar past few weeks  ? Ureteral calculi   ? right  ? Wears contact lenses   ? ?Past Surgical History:  ?Procedure Laterality Date  ? COLONOSCOPY WITH PROPOFOL  03-13-2015  dr Christella Hartiganjacobs  ? CYSTOSCOPY WITH HOLMIUM LASER LITHOTRIPSY  12-23-2003   dr Vernie Ammonsottelin  @[facility]   ? CYSTOSCOPY/RETROGRADE/URETEROSCOPY/STONE EXTRACTION WITH BASKET Right 03/26/2019  ? Procedure: CYSTOSCOPY/RETROGRADE/URETEROSCOPY/STONE EXTRACTION WITH BASKET/ STENT PLACEMENT;  Surgeon: Ihor Gullyttelin, Mark, MD;  [facility];  Service: Urology;  Laterality: Right;  ? EXTRACORPOREAL SHOCK WAVE LITHOTRIPSY  11-05-2012   @[facility]   ? TONSILLECTOMY  child  ? ?Social History: ?  reports that he quit smoking about 31 years ago. His smoking use included cigarettes. He has a 1.50 pack-year smoking history. He has never used smokeless tobacco. He reports current alcohol use of about 4.0 standard drinks per week. He reports that he does not use drugs. ? ?Family History  ?Problem Relation Age of Onset  ? Cancer Mother   ? Cataracts Father   ? Colon cancer Neg Hx   ? Rectal cancer Neg Hx   ? Stomach cancer  Neg Hx   ? Esophageal cancer Neg Hx   ? ? ?Medications: ?Patient's Medications  ?New Prescriptions  ? No medications on file  ?Previous Medications  ? ASPIRIN 81 MG TABLET    Take 81 mg by mouth daily.  ? ATORVASTATIN (LIPITOR) 20 MG TABLET    TAKE 1 TABLET BY MOUTH EVERY DAY  ? CETIRIZINE (ZYRTEC) 10 MG TABLET    Take 10 mg by mouth daily.  ? DULAGLUTIDE (TRULICITY) 1.5 MG/0.5ML SOPN    INJECT 1.5MG  DOSE INTO THE SKIN ONCE WEEKLY  ?  EMPAGLIFLOZIN (JARDIANCE) 25 MG TABS TABLET    TAKE 1 TABLET (25 MG TOTAL) BY MOUTH DAILY.  ? FENOFIBRATE (TRICOR) 145 MG TABLET    Take 1 tablet (145 mg total) by mouth daily. (E78.5)  ? FLUTICASONE (FLONASE) 50 MCG/ACT NASAL SPRAY    Place 1 spray into both nostrils as needed for allergies or rhinitis.  ? GLUCOSE BLOOD (ONETOUCH VERIO) TEST STRIP    Use to test blood sugar twice daily Dx: E11.69  ? IBUPROFEN (ADVIL) 200 MG TABLET    Take 200 mg by mouth every 6 (six) hours as needed.  ? LANCET DEVICES (ONE TOUCH DELICA LANCING DEV) MISC    Check blood sugar 2 x daily as directed DX: 250.02  ? LISINOPRIL (ZESTRIL) 10 MG TABLET    TAKE 1 TABLET BY MOUTH EVERY DAY  ? METFORMIN (GLUCOPHAGE) 1000 MG TABLET    TAKE 1 TABLET BY MOUTH 2 TIMES DAILY WITH A MEAL.  ? NAPROXEN SODIUM (ALEVE) 220 MG TABLET    Take 220 mg by mouth as needed.  ? PIOGLITAZONE (ACTOS) 15 MG TABLET    TAKE 1 TABLET BY MOUTH EVERY DAY  ? POTASSIUM CITRATE (UROCIT-K) 10 MEQ (1080 MG) SR TABLET    at bedtime. 2 by mouth once daily to prevent kidney stones  ?Modified Medications  ? No medications on file  ?Discontinued Medications  ? No medications on file  ? ? ?Physical Exam: ? ?Vitals:  ? 10/12/21 0804  ?BP: 126/80  ?Pulse: 72  ?Resp: 18  ?Temp: 97.7 ?F (36.5 ?C)  ?SpO2: 96%  ?Weight: 234 lb 11.2 oz (106.5 kg)  ?Height: 5\' 8"  (1.727 m)  ? ?Body mass index is 35.69 kg/m?. ?Wt Readings from Last 3 Encounters:  ?10/12/21 234 lb 11.2 oz (106.5 kg)  ?07/06/21 235 lb (106.6 kg)  ?01/05/21 227 lb (103 kg)  ? ? ?Physical Exam ?Constitutional:   ?   General: He is not in acute distress. ?   Appearance: He is well-developed. He is obese. He is not diaphoretic.  ?HENT:  ?   Head: Normocephalic and atraumatic.  ?   Right Ear: External ear normal.  ?   Left Ear: External ear normal.  ?   Mouth/Throat:  ?   Pharynx: No oropharyngeal exudate.  ?Eyes:  ?   Conjunctiva/sclera: Conjunctivae normal.  ?   Pupils: Pupils are equal, round, and reactive to light.   ?Cardiovascular:  ?   Rate and Rhythm: Normal rate and regular rhythm.  ?   Heart sounds: Normal heart sounds.  ?Pulmonary:  ?   Effort: Pulmonary effort is normal.  ?   Breath sounds: Normal breath sounds.  ?Abdominal:  ?   General: Bowel sounds are normal.  ?   Palpations: Abdomen is soft.  ?Musculoskeletal:     ?   General: No tenderness.  ?   Cervical back: Normal range of motion and neck supple.  ?  Right lower leg: No edema.  ?   Left lower leg: No edema.  ?Skin: ?   General: Skin is warm and dry.  ?Neurological:  ?   Mental Status: He is alert and oriented to person, place, and time.  ?   Gait: Gait normal.  ? ? ?Labs reviewed: ?Basic Metabolic Panel: ?Recent Labs  ?  01/05/21 ?0901 06/29/21 ?0816 10/10/21 ?0801  ?NA 138 139 139  ?K 4.4 4.5 4.9  ?CL 100 102 100  ?CO2 24 25 28   ?GLUCOSE 113* 128* 140*  ?BUN 17 20 19   ?CREATININE 0.90 1.04 1.18  ?CALCIUM 9.9 9.7 10.2  ? ?Liver Function Tests: ?Recent Labs  ?  01/05/21 ?0901 06/29/21 ?0816 10/10/21 ?0801  ?AST 32 54* 52*  ?ALT 38 67* 57*  ?BILITOT 1.0 1.0 0.8  ?PROT 7.4 7.0 7.5  ? ?No results for input(s): LIPASE, AMYLASE in the last 8760 hours. ?No results for input(s): AMMONIA in the last 8760 hours. ?CBC: ?Recent Labs  ?  01/05/21 ?0901 06/29/21 ?0816 10/10/21 ?0801  ?WBC 9.2 7.3 6.2  ?NEUTROABS 5,943 07/01/21 3,739  ?HGB 16.0 16.0 15.5  ?HCT 48.3 47.2 46.7  ?MCV 90.3 89.9 90.0  ?PLT 291 273 236  ? ?Lipid Panel: ?Recent Labs  ?  01/05/21 ?0901 06/29/21 ?0816 10/10/21 ?0801  ?CHOL 158 136 138  ?HDL 35* 30* 33*  ?LDLCALC 84 75 77  ?TRIG 320* 225* 188*  ?CHOLHDL 4.5 4.5 4.2  ? ?TSH: ?No results for input(s): TSH in the last 8760 hours. ?A1C: ?Lab Results  ?Component Value Date  ? HGBA1C 8.0 (H) 10/10/2021  ? ? ? ?Assessment/Plan ?1. Essential hypertension ?-Blood pressure well controlled ?Continue current medications ?Recheck metabolic panel  ? ?2. Diabetes mellitus type 2 in obese [facility]) ?-NOT at goal.  ?-- discussed with the patient the pathophysiology of  diabetes and the natural progression of the disease.  ?-stressed the importance of lifestyle changes including diet and exercise. ?-discussed complications associated with diabetes including retinopathy, nephropathy, neuropathy

## 2021-10-12 ENCOUNTER — Encounter: Payer: Self-pay | Admitting: Nurse Practitioner

## 2021-10-12 ENCOUNTER — Ambulatory Visit (INDEPENDENT_AMBULATORY_CARE_PROVIDER_SITE_OTHER): Payer: Managed Care, Other (non HMO) | Admitting: Nurse Practitioner

## 2021-10-12 VITALS — BP 126/80 | HR 72 | Temp 97.7°F | Resp 18 | Ht 68.0 in | Wt 234.7 lb

## 2021-10-12 DIAGNOSIS — E1169 Type 2 diabetes mellitus with other specified complication: Secondary | ICD-10-CM

## 2021-10-12 DIAGNOSIS — E785 Hyperlipidemia, unspecified: Secondary | ICD-10-CM

## 2021-10-12 DIAGNOSIS — I10 Essential (primary) hypertension: Secondary | ICD-10-CM | POA: Diagnosis not present

## 2021-10-12 DIAGNOSIS — Z6835 Body mass index (BMI) 35.0-35.9, adult: Secondary | ICD-10-CM

## 2021-10-12 DIAGNOSIS — N400 Enlarged prostate without lower urinary tract symptoms: Secondary | ICD-10-CM | POA: Diagnosis not present

## 2021-10-12 DIAGNOSIS — E669 Obesity, unspecified: Secondary | ICD-10-CM

## 2021-10-12 DIAGNOSIS — J309 Allergic rhinitis, unspecified: Secondary | ICD-10-CM

## 2021-10-12 MED ORDER — TRULICITY 3 MG/0.5ML ~~LOC~~ SOAJ: 3.0000 mg | SUBCUTANEOUS | 2 refills | Status: DC

## 2021-10-12 NOTE — Patient Instructions (Addendum)
Start melatonin 1 mg daily at bedtime- take routinely  ?At night start body scanning If unable to sleep.  ? ?Can use xyzal if needed (use instead of zyrtec)  ? ?To increase trulicity  to 3 mg injection weekly  ? ?Follow up 3 months, labs prior  ?

## 2021-10-13 LAB — MICROALBUMIN / CREATININE URINE RATIO
Creatinine, Urine: 55 mg/dL (ref 20–320)
Microalb Creat Ratio: 11 mcg/mg creat (ref <30)
Microalb, Ur: 0.6 mg/dL

## 2021-12-31 ENCOUNTER — Encounter: Payer: Self-pay | Admitting: Nurse Practitioner

## 2021-12-31 DIAGNOSIS — E1169 Type 2 diabetes mellitus with other specified complication: Secondary | ICD-10-CM

## 2021-12-31 MED ORDER — TRULICITY 3 MG/0.5ML ~~LOC~~ SOAJ: 3.0000 mg | SUBCUTANEOUS | 2 refills | Status: DC

## 2022-01-04 NOTE — Telephone Encounter (Signed)
Prior Authorization initiated through [employer] Information has been submitted to [employer] for review, awaiting determination.   [id number] OMV:[id number] Rx#: [id number] Grp: PECAAIS ID: [id number]  TKP:[id number]

## 2022-01-11 ENCOUNTER — Other Ambulatory Visit: Payer: Managed Care, Other (non HMO)

## 2022-01-11 DIAGNOSIS — E669 Obesity, unspecified: Secondary | ICD-10-CM

## 2022-01-12 LAB — COMPLETE METABOLIC PANEL WITH GFR
AG Ratio: 1.8 (calc) (ref 1.0–2.5)
ALT: 63 U/L — ABNORMAL HIGH (ref 9–46)
AST: 54 U/L — ABNORMAL HIGH (ref 10–35)
Albumin: 4.5 g/dL (ref 3.6–5.1)
Alkaline phosphatase (APISO): 69 U/L (ref 35–144)
BUN: 16 mg/dL (ref 7–25)
CO2: 24 mmol/L (ref 20–32)
Calcium: 9.5 mg/dL (ref 8.6–10.3)
Chloride: 104 mmol/L (ref 98–110)
Creat: 1.08 mg/dL (ref 0.70–1.35)
Globulin: 2.5 g/dL (calc) (ref 1.9–3.7)
Glucose, Bld: 156 mg/dL — ABNORMAL HIGH (ref 65–99)
Potassium: 4.6 mmol/L (ref 3.5–5.3)
Sodium: 138 mmol/L (ref 135–146)
Total Bilirubin: 0.7 mg/dL (ref 0.2–1.2)
Total Protein: 7 g/dL (ref 6.1–8.1)
eGFR: 79 mL/min/{1.73_m2} (ref >=60)

## 2022-01-12 LAB — HEMOGLOBIN A1C
Hgb A1c MFr Bld: 7.5 % of total Hgb — ABNORMAL HIGH (ref <5.7)
Mean Plasma Glucose: 169 mg/dL
eAG (mmol/L): 9.3 mmol/L

## 2022-01-17 ENCOUNTER — Encounter: Payer: Self-pay | Admitting: Nurse Practitioner

## 2022-01-17 NOTE — Progress Notes (Signed)
Careteam: Patient Care Team: Lauree Chandler, NP as PCP - General (Nurse Practitioner) Sharol Roussel, [place] as Consulting Physician (Optometry) Kathie Rhodes, MD (Inactive) as Consulting Physician (Urology)  PLACE OF SERVICE:  [place] Directive information    Allergies  Allergen Reactions   Hydrocodone Nausea And Vomiting   Oxycodone Nausea And Vomiting    Chief Complaint  Patient presents with   Medical Management of Chronic Issues    3 month follow-up and discuss labs. Discuss need for flu vaccine or post pone if patient refuses (high dose flu vaccine out of stock). [facility] verified. Patient is still not on the Trulicity dose as listed on medication list, patient is on lower dose      HPI: Patient is a 61 y.o. male for routine follow up.   DM- had a hard time getting trulicity then when he did get it the 1.5 mg vs the 3 mg, he is due for another refill in 2 weeks and will get the 3 mg Working on dietary modifications.   He has increased physical activity because they have some yard projects.   Allergies continues to be bad, needs to use flonase   Htn well controlled.   Review of Systems:  Review of Systems  Constitutional:  Negative for chills, fever and weight loss.  HENT:  Negative for tinnitus.   Respiratory:  Negative for cough, sputum production and shortness of breath.   Cardiovascular:  Negative for chest pain, palpitations and leg swelling.  Gastrointestinal:  Negative for abdominal pain, constipation, diarrhea and heartburn.  Genitourinary:  Negative for dysuria, frequency and urgency.  Musculoskeletal:  Negative for back pain, falls, joint pain and myalgias.  Skin: Negative.   Neurological:  Negative for dizziness and headaches.  Psychiatric/Behavioral:  Negative for depression and memory loss. The patient does not have insomnia.     Past Medical History:  Diagnosis Date   Fatty liver    followed by dr Ardis Hughs--- mild elevated LFT since  2015,  blood work-up done 05/ 2020 (epic), and abd. ultrasound 10-16-2018 (epic),  recommendation loss weight   History of kidney stones    Hyperlipidemia    Hypertension    Seasonal allergies    Type 2 diabetes mellitus ([place])    followed by pcp--  fasting cbg-- 140;  had been checking blood surgar past few weeks   Ureteral calculi    right   Wears contact lenses    Past Surgical History:  Procedure Laterality Date   COLONOSCOPY WITH PROPOFOL  03-13-2015  dr Ardis Hughs   CYSTOSCOPY WITH HOLMIUM LASER LITHOTRIPSY  12-23-2003   dr Karsten Ro  @[facility]    CYSTOSCOPY/RETROGRADE/URETEROSCOPY/STONE EXTRACTION WITH BASKET Right 03/26/2019   Procedure: CYSTOSCOPY/RETROGRADE/URETEROSCOPY/STONE EXTRACTION WITH BASKET/ STENT PLACEMENT;  Surgeon: Kathie Rhodes, MD;  Location: [place];  Service: Urology;  Laterality: Right;   EXTRACORPOREAL SHOCK WAVE LITHOTRIPSY  11-05-2012   @[facility]    TONSILLECTOMY  child   Social History:   reports that he quit smoking about 31 years ago. His smoking use included cigarettes. He has a 1.50 pack-year smoking history. He has never used smokeless tobacco. He reports current alcohol use of about 4.0 standard drinks of alcohol per week. He reports that he does not use drugs.  Family History  Problem Relation Age of Onset   Cancer Mother    Cataracts Father    Colon cancer Neg Hx    Rectal cancer Neg Hx    Stomach cancer Neg Hx  Esophageal cancer Neg Hx     Medications: Patient's Medications  New Prescriptions   No medications on file  Previous Medications   ASPIRIN 81 MG TABLET    Take 81 mg by mouth daily.   ATORVASTATIN (LIPITOR) 20 MG TABLET    TAKE 1 TABLET BY MOUTH EVERY DAY   CETIRIZINE (ZYRTEC) 10 MG TABLET    Take 10 mg by mouth daily.   DULAGLUTIDE (TRULICITY) 3 MG/0.5ML SOPN    Inject 3 mg as directed once a week.   EMPAGLIFLOZIN (JARDIANCE) 25 MG TABS TABLET    TAKE 1 TABLET (25 MG TOTAL) BY MOUTH DAILY.   FENOFIBRATE (TRICOR) 145 MG  TABLET    Take 1 tablet (145 mg total) by mouth daily. (E78.5)   FLUTICASONE (FLONASE) 50 MCG/ACT NASAL SPRAY    Place 1 spray into both nostrils as needed for allergies or rhinitis.   GLUCOSE BLOOD (ONETOUCH VERIO) TEST STRIP    Use to test blood sugar twice daily Dx: E11.69   IBUPROFEN (ADVIL) 200 MG TABLET    Take 200 mg by mouth every 6 (six) hours as needed.   LANCET DEVICES (ONE TOUCH DELICA LANCING DEV) MISC    Check blood sugar 2 x daily as directed DX: 250.02   LISINOPRIL (ZESTRIL) 10 MG TABLET    TAKE 1 TABLET BY MOUTH EVERY DAY   METFORMIN (GLUCOPHAGE) 1000 MG TABLET    TAKE 1 TABLET BY MOUTH 2 TIMES DAILY WITH A MEAL.   NAPROXEN SODIUM (ALEVE) 220 MG TABLET    Take 220 mg by mouth as needed.   PIOGLITAZONE (ACTOS) 15 MG TABLET    TAKE 1 TABLET BY MOUTH EVERY DAY   POTASSIUM CITRATE (UROCIT-K) 10 MEQ (1080 MG) SR TABLET    at bedtime. 2 by mouth once daily to prevent kidney stones  Modified Medications   No medications on file  Discontinued Medications   No medications on file    Physical Exam:  Vitals:   01/17/22 1441  BP: 118/72  Pulse: 86  Temp: 97.6 F (36.4 C)  TempSrc: Temporal  SpO2: 95%  Weight: 233 lb (105.7 kg)  Height: 5\' 8"  (1.727 m)   Body mass index is 35.43 kg/m. Wt Readings from Last 3 Encounters:  01/17/22 233 lb (105.7 kg)  10/12/21 234 lb 11.2 oz (106.5 kg)  07/06/21 235 lb (106.6 kg)    Physical Exam Constitutional:      General: He is not in acute distress.    Appearance: He is well-developed. He is not diaphoretic.  HENT:     Head: Normocephalic and atraumatic.     Right Ear: External ear normal.     Left Ear: External ear normal.     Mouth/Throat:     Pharynx: No oropharyngeal exudate.  Eyes:     Conjunctiva/sclera: Conjunctivae normal.     Pupils: Pupils are equal, round, and reactive to light.  Cardiovascular:     Rate and Rhythm: Normal rate and regular rhythm.     Heart sounds: Normal heart sounds.  Pulmonary:     Effort:  Pulmonary effort is normal.     Breath sounds: Normal breath sounds.  Abdominal:     General: Bowel sounds are normal.     Palpations: Abdomen is soft.  Musculoskeletal:        General: No tenderness.     Cervical back: Normal range of motion and neck supple.     Right lower leg: No edema.  Left lower leg: No edema.  Skin:    General: Skin is warm and dry.  Neurological:     Mental Status: He is alert and oriented to person, place, and time.     Labs reviewed: Basic Metabolic Panel: Recent Labs    06/29/21 0816 10/10/21 0801 01/11/22 0811  NA 139 139 138  K 4.5 4.9 4.6  CL 102 100 104  CO2 25 28 24   GLUCOSE 128* 140* 156*  BUN 20 19 16   CREATININE 1.04 1.18 1.08  CALCIUM 9.7 10.2 9.5   Liver Function Tests: Recent Labs    06/29/21 0816 10/10/21 0801 01/11/22 0811  AST 54* 52* 54*  ALT 67* 57* 63*  BILITOT 1.0 0.8 0.7  PROT 7.0 7.5 7.0   No results for input(s): "LIPASE", "AMYLASE" in the last 8760 hours. No results for input(s): "AMMONIA" in the last 8760 hours. CBC: Recent Labs    06/29/21 0816 10/10/21 0801  WBC 7.3 6.2  NEUTROABS 4,468 3,739  HGB 16.0 15.5  HCT 47.2 46.7  MCV 89.9 90.0  PLT 273 236   Lipid Panel: Recent Labs    06/29/21 0816 10/10/21 0801  CHOL 136 138  HDL 30* 33*  LDLCALC 75 77  TRIG 225* 188*  CHOLHDL 4.5 4.2   TSH: No results for input(s): "TSH" in the last 8760 hours. A1C: Lab Results  Component Value Date   HGBA1C 7.5 (H) 01/11/2022     Assessment/Plan 1. Essential hypertension -Blood pressure well controlled, goal bp <140/90 Continue current medications and dietary modifications follow metabolic panel - COMPLETE METABOLIC PANEL WITH GFR; Future - CBC with Differential/Platelet; Future  2. Allergic rhinitis, unspecified seasonality, unspecified trigger -ongoing, he has not used xyzal but plans to try.   3. Diabetes mellitus type 2 in obese ([facility]) -Encouraged dietary compliance, routine foot  care/monitoring and to keep up with diabetic eye exams through ophthalmology  -he will start increase dose of trulicity at next refill.  -a1c has improved but not at goal.  - Hemoglobin A1c; Future  4. Benign prostatic hyperplasia without lower urinary tract symptoms Stable, without worsening of symptoms.   5. Hyperlipidemia LDL goal <70 -cholesterol hear goal. Continue current regimen and continue to work on dietary modifications.  - Lipid panel; Future  6. Class 2 severe obesity due to excess calories with serious comorbidity and body mass index (BMI) of 35.0 to 35.9 in adult [facility]) --education provided on healthy weight loss through increase in physical activity and proper nutrition    Return in about 3 months (around 04/20/2022) for routine follow up, labs prior. [facility]. 04/22/2022  [facility] [phone]

## 2022-01-18 ENCOUNTER — Ambulatory Visit (INDEPENDENT_AMBULATORY_CARE_PROVIDER_SITE_OTHER): Payer: Managed Care, Other (non HMO) | Admitting: Nurse Practitioner

## 2022-01-18 ENCOUNTER — Encounter: Payer: Self-pay | Admitting: Nurse Practitioner

## 2022-01-18 VITALS — BP 118/72 | HR 86 | Temp 97.6°F | Ht 68.0 in | Wt 233.0 lb

## 2022-01-18 DIAGNOSIS — I10 Essential (primary) hypertension: Secondary | ICD-10-CM

## 2022-01-18 DIAGNOSIS — J309 Allergic rhinitis, unspecified: Secondary | ICD-10-CM | POA: Diagnosis not present

## 2022-01-18 DIAGNOSIS — E669 Obesity, unspecified: Secondary | ICD-10-CM

## 2022-01-18 DIAGNOSIS — Z6835 Body mass index (BMI) 35.0-35.9, adult: Secondary | ICD-10-CM

## 2022-01-18 DIAGNOSIS — E1169 Type 2 diabetes mellitus with other specified complication: Secondary | ICD-10-CM | POA: Diagnosis not present

## 2022-01-18 DIAGNOSIS — N400 Enlarged prostate without lower urinary tract symptoms: Secondary | ICD-10-CM

## 2022-01-18 DIAGNOSIS — E785 Hyperlipidemia, unspecified: Secondary | ICD-10-CM

## 2022-02-01 ENCOUNTER — Other Ambulatory Visit: Payer: Self-pay | Admitting: Nurse Practitioner

## 2022-02-01 DIAGNOSIS — E1169 Type 2 diabetes mellitus with other specified complication: Secondary | ICD-10-CM

## 2022-02-10 ENCOUNTER — Other Ambulatory Visit: Payer: Self-pay | Admitting: Nurse Practitioner

## 2022-02-10 DIAGNOSIS — I10 Essential (primary) hypertension: Secondary | ICD-10-CM

## 2022-03-04 ENCOUNTER — Other Ambulatory Visit: Payer: Self-pay | Admitting: Nurse Practitioner

## 2022-03-04 DIAGNOSIS — E1169 Type 2 diabetes mellitus with other specified complication: Secondary | ICD-10-CM

## 2022-03-12 ENCOUNTER — Encounter: Payer: Self-pay | Admitting: Nurse Practitioner

## 2022-03-13 ENCOUNTER — Other Ambulatory Visit: Payer: Self-pay

## 2022-03-13 ENCOUNTER — Telehealth: Payer: Self-pay

## 2022-03-13 DIAGNOSIS — Z23 Encounter for immunization: Secondary | ICD-10-CM

## 2022-03-13 DIAGNOSIS — E1169 Type 2 diabetes mellitus with other specified complication: Secondary | ICD-10-CM

## 2022-03-13 MED ORDER — FLUZONE HIGH-DOSE 0.5 ML IM SUSY: 0.5000 mL | PREFILLED_SYRINGE | Freq: Once | INTRAMUSCULAR | 0 refills | Status: AC

## 2022-03-13 NOTE — Telephone Encounter (Signed)
Sent rx to high dose flu vaccine to local pharmacy.

## 2022-03-13 NOTE — Progress Notes (Signed)
This encounter was created in error - please disregard.

## 2022-03-28 ENCOUNTER — Other Ambulatory Visit: Payer: Self-pay

## 2022-03-28 DIAGNOSIS — I10 Essential (primary) hypertension: Secondary | ICD-10-CM

## 2022-03-28 DIAGNOSIS — E1169 Type 2 diabetes mellitus with other specified complication: Secondary | ICD-10-CM

## 2022-03-28 DIAGNOSIS — E785 Hyperlipidemia, unspecified: Secondary | ICD-10-CM

## 2022-04-05 ENCOUNTER — Other Ambulatory Visit: Payer: Managed Care, Other (non HMO)

## 2022-04-06 LAB — COMPLETE METABOLIC PANEL WITH GFR
AG Ratio: 1.7 (calc) (ref 1.0–2.5)
ALT: 52 U/L — ABNORMAL HIGH (ref 9–46)
AST: 45 U/L — ABNORMAL HIGH (ref 10–35)
Albumin: 4.4 g/dL (ref 3.6–5.1)
Alkaline phosphatase (APISO): 60 U/L (ref 35–144)
BUN: 20 mg/dL (ref 7–25)
CO2: 25 mmol/L (ref 20–32)
Calcium: 9.6 mg/dL (ref 8.6–10.3)
Chloride: 104 mmol/L (ref 98–110)
Creat: 1 mg/dL (ref 0.70–1.35)
Globulin: 2.6 g/dL (calc) (ref 1.9–3.7)
Glucose, Bld: 119 mg/dL — ABNORMAL HIGH (ref 65–99)
Potassium: 4.5 mmol/L (ref 3.5–5.3)
Sodium: 140 mmol/L (ref 135–146)
Total Bilirubin: 0.9 mg/dL (ref 0.2–1.2)
Total Protein: 7 g/dL (ref 6.1–8.1)
eGFR: 86 mL/min/{1.73_m2} (ref >=60)

## 2022-04-06 LAB — CBC WITH DIFFERENTIAL/PLATELET
Absolute Monocytes: 497 cells/uL (ref 200–950)
Basophils Absolute: 70 cells/uL (ref 0–200)
Basophils Relative: 1 %
Eosinophils Absolute: 189 cells/uL (ref 15–500)
Eosinophils Relative: 2.7 %
HCT: 43.2 % (ref 38.5–50.0)
Hemoglobin: 14.5 g/dL (ref 13.2–17.1)
Lymphs Abs: 2023 cells/uL (ref 850–3900)
MCH: 29.3 pg (ref 27.0–33.0)
MCHC: 33.6 g/dL (ref 32.0–36.0)
MCV: 87.3 fL (ref 80.0–100.0)
MPV: 9.5 fL (ref 7.5–12.5)
Monocytes Relative: 7.1 %
Neutro Abs: 4221 cells/uL (ref 1500–7800)
Neutrophils Relative %: 60.3 %
Platelets: 258 10*3/uL (ref 140–400)
RBC: 4.95 10*6/uL (ref 4.20–5.80)
RDW: 13.4 % (ref 11.0–15.0)
Total Lymphocyte: 28.9 %
WBC: 7 10*3/uL (ref 3.8–10.8)

## 2022-04-06 LAB — LIPID PANEL
Cholesterol: 115 mg/dL (ref <200)
HDL: 30 mg/dL — ABNORMAL LOW (ref >=40)
LDL Cholesterol (Calc): 60 mg/dL (calc)
Non-HDL Cholesterol (Calc): 85 mg/dL (calc) (ref <130)
Total CHOL/HDL Ratio: 3.8 (calc) (ref <5.0)
Triglycerides: 172 mg/dL — ABNORMAL HIGH (ref <150)

## 2022-04-06 LAB — HEMOGLOBIN A1C
Hgb A1c MFr Bld: 7.6 % of total Hgb — ABNORMAL HIGH (ref <5.7)
Mean Plasma Glucose: 171 mg/dL
eAG (mmol/L): 9.5 mmol/L

## 2022-04-12 ENCOUNTER — Encounter: Payer: Self-pay | Admitting: Nurse Practitioner

## 2022-04-12 ENCOUNTER — Ambulatory Visit (INDEPENDENT_AMBULATORY_CARE_PROVIDER_SITE_OTHER): Payer: Managed Care, Other (non HMO) | Admitting: Nurse Practitioner

## 2022-04-12 VITALS — BP 118/74 | HR 63 | Temp 97.8°F | Resp 18 | Ht 68.0 in | Wt 229.0 lb

## 2022-04-12 DIAGNOSIS — R748 Abnormal levels of other serum enzymes: Secondary | ICD-10-CM

## 2022-04-12 DIAGNOSIS — E1169 Type 2 diabetes mellitus with other specified complication: Secondary | ICD-10-CM

## 2022-04-12 DIAGNOSIS — I10 Essential (primary) hypertension: Secondary | ICD-10-CM | POA: Diagnosis not present

## 2022-04-12 DIAGNOSIS — E669 Obesity, unspecified: Secondary | ICD-10-CM

## 2022-04-12 DIAGNOSIS — E785 Hyperlipidemia, unspecified: Secondary | ICD-10-CM | POA: Diagnosis not present

## 2022-04-12 DIAGNOSIS — E661 Drug-induced obesity: Secondary | ICD-10-CM

## 2022-04-12 DIAGNOSIS — Z6834 Body mass index (BMI) 34.0-34.9, adult: Secondary | ICD-10-CM

## 2022-04-12 NOTE — Progress Notes (Signed)
Careteam: Patient Care Team: Lauree Chandler, NP as PCP - General (Nurse Practitioner) Sharol Roussel, [place] as Consulting Physician (Optometry) Kathie Rhodes, MD (Inactive) as Consulting Physician (Urology)  PLACE OF SERVICE:  [place] Directive information Does Patient Have a Medical Advance Directive?: Yes, Type of Advance Directive: Living will;Healthcare Power of [place];Out of facility DNR (pink MOST or yellow form)  Allergies  Allergen Reactions   Hydrocodone Nausea And Vomiting   Oxycodone Nausea And Vomiting    Chief Complaint  Patient presents with   Medical Management of Chronic Issues    3 month follow up.     HPI: Patient is a 61 y.o. male for routine follow up  DM- A1c is not at goal- he has not been exercising because he is working 9 hour days but this will stop next week. Stress eating. Walks a lot at work,  He would like to give himself 3 more months to see  Gets nauseated with Trulicity if he eats too much.   Hyperlipidemia- LDL at goal. Continues on fenofibrate and lipitor.    Review of Systems:  Review of Systems  Constitutional:  Negative for chills, fever and weight loss.  HENT:  Negative for tinnitus.   Respiratory:  Negative for cough, sputum production and shortness of breath.   Cardiovascular:  Negative for chest pain, palpitations and leg swelling.  Gastrointestinal:  Negative for abdominal pain, constipation, diarrhea and heartburn.  Genitourinary:  Negative for dysuria, frequency and urgency.  Musculoskeletal:  Negative for back pain, falls, joint pain and myalgias.  Skin: Negative.   Neurological:  Negative for dizziness and headaches.  Psychiatric/Behavioral:  Negative for depression and memory loss. The patient does not have insomnia.     Past Medical History:  Diagnosis Date   Fatty liver    followed by dr Ardis Hughs--- mild elevated LFT since 2015,  blood work-up done 05/ 2020 (epic), and abd. ultrasound 10-16-2018  (epic),  recommendation loss weight   History of kidney stones    Hyperlipidemia    Hypertension    Seasonal allergies    Type 2 diabetes mellitus ([place])    followed by pcp--  fasting cbg-- 140;  had been checking blood surgar past few weeks   Ureteral calculi    right   Wears contact lenses    Past Surgical History:  Procedure Laterality Date   COLONOSCOPY WITH PROPOFOL  03-13-2015  dr Ardis Hughs   CYSTOSCOPY WITH HOLMIUM LASER LITHOTRIPSY  12-23-2003   dr Karsten Ro  _0    CYSTOSCOPY/RETROGRADE/URETEROSCOPY/STONE EXTRACTION WITH BASKET Right 03/26/2019   Procedure: CYSTOSCOPY/RETROGRADE/URETEROSCOPY/STONE EXTRACTION WITH BASKET/ STENT PLACEMENT;  Surgeon: Kathie Rhodes, MD;  Location: [place];  Service: Urology;  Laterality: Right;   EXTRACORPOREAL SHOCK WAVE LITHOTRIPSY  11-05-2012   _1    TONSILLECTOMY  child   Social History:   reports that he quit smoking about 31 years ago. His smoking use included cigarettes. He has a 1.50 pack-year smoking history. He has never used smokeless tobacco. He reports current alcohol use of about 4.0 standard drinks of alcohol per week. He reports that he does not use drugs.  Family History  Problem Relation Age of Onset   Cancer Mother    Cataracts Father    Colon cancer Neg Hx    Rectal cancer Neg Hx    Stomach cancer Neg Hx    Esophageal cancer Neg Hx     Medications: Patient's Medications  New Prescriptions   No medications on  file  Previous Medications   ASPIRIN 81 MG TABLET    Take 81 mg by mouth daily.   ATORVASTATIN (LIPITOR) 20 MG TABLET    TAKE 1 TABLET BY MOUTH EVERY DAY   CETIRIZINE (ZYRTEC) 10 MG TABLET    Take 10 mg by mouth daily.   DULAGLUTIDE (TRULICITY) 3 YK/9.9IP SOPN    INJECT 3 MG AS DIRECTED ONCE A WEEK.   EMPAGLIFLOZIN (JARDIANCE) 25 MG TABS TABLET    TAKE 1 TABLET (25 MG TOTAL) BY MOUTH DAILY.   FENOFIBRATE (TRICOR) 145 MG TABLET    Take 1 tablet (145 mg total) by mouth daily. (E78.5)   FLUTICASONE  (FLONASE) 50 MCG/ACT NASAL SPRAY    Place 1 spray into both nostrils as needed for allergies or rhinitis.   GLUCOSE BLOOD (ONETOUCH VERIO) TEST STRIP    Use to test blood sugar twice daily Dx: E11.69   IBUPROFEN (ADVIL) 200 MG TABLET    Take 200 mg by mouth every 6 (six) hours as needed.   LANCET DEVICES (ONE TOUCH DELICA LANCING DEV) MISC    Check blood sugar 2 x daily as directed DX: 250.02   LISINOPRIL (ZESTRIL) 10 MG TABLET    TAKE 1 TABLET BY MOUTH EVERY DAY   METFORMIN (GLUCOPHAGE) 1000 MG TABLET    TAKE 1 TABLET BY MOUTH 2 TIMES DAILY WITH A MEAL.   NAPROXEN SODIUM (ALEVE) 220 MG TABLET    Take 220 mg by mouth as needed.   PIOGLITAZONE (ACTOS) 15 MG TABLET    TAKE 1 TABLET BY MOUTH EVERY DAY   POTASSIUM CITRATE (UROCIT-K) 10 MEQ (1080 MG) SR TABLET    at bedtime. 2 by mouth once daily to prevent kidney stones  Modified Medications   No medications on file  Discontinued Medications   No medications on file    Physical Exam:  Vitals:   04/12/22 0815  BP: 118/74  Pulse: 63  Resp: 18  Temp: 97.8 F (36.6 C)  TempSrc: Temporal  SpO2: 96%  Weight: 229 lb (103.9 kg)  Height: _0  (1.727 m)   Body mass index is 34.82 kg/m. Wt Readings from Last 3 Encounters:  04/12/22 229 lb (103.9 kg)  01/17/22 233 lb (105.7 kg)  10/12/21 234 lb 11.2 oz (106.5 kg)    Physical Exam Constitutional:      General: He is not in acute distress.    Appearance: He is well-developed. He is not diaphoretic.  HENT:     Head: Normocephalic and atraumatic.     Right Ear: External ear normal.     Left Ear: External ear normal.     Mouth/Throat:     Pharynx: No oropharyngeal exudate.  Eyes:     Conjunctiva/sclera: Conjunctivae normal.     Pupils: Pupils are equal, round, and reactive to light.  Cardiovascular:     Rate and Rhythm: Normal rate and regular rhythm.     Heart sounds: Normal heart sounds.  Pulmonary:     Effort: Pulmonary effort is normal.     Breath sounds: Normal breath sounds.   Abdominal:     General: Bowel sounds are normal.     Palpations: Abdomen is soft.  Musculoskeletal:        General: No tenderness.     Cervical back: Normal range of motion and neck supple.     Right lower leg: No edema.     Left lower leg: No edema.  Skin:    General: Skin is warm and  dry.  Neurological:     Mental Status: He is alert and oriented to person, place, and time.     Labs reviewed: Basic Metabolic Panel: Recent Labs    10/10/21 0801 01/11/22 0811 04/05/22 0827  NA 139 138 140  K 4.9 4.6 4.5  CL 100 104 104  CO2 _0 GLUCOSE 140* 156* 119*  BUN _1 CREATININE 1.18 1.08 1.00  CALCIUM 10.2 9.5 9.6   Liver Function Tests: Recent Labs    10/10/21 0801 01/11/22 0811 04/05/22 0827  AST 52* 54* 45*  ALT 57* 63* 52*  BILITOT 0.8 0.7 0.9  PROT 7.5 7.0 7.0   No results for input(s): "LIPASE", "AMYLASE" in the last 8760 hours. No results for input(s): "AMMONIA" in the last 8760 hours. CBC: Recent Labs    06/29/21 0816 10/10/21 0801 04/05/22 0827  WBC 7.3 6.2 7.0  NEUTROABS 4,468 3,739 4,221  HGB 16.0 15.5 14.5  HCT 47.2 46.7 43.2  MCV 89.9 90.0 87.3  PLT 273 236 258   Lipid Panel: Recent Labs    06/29/21 0816 10/10/21 0801 04/05/22 0827  CHOL 136 138 115  HDL 30* 33* 30*  LDLCALC 75 77 60  TRIG 225* 188* 172*  CHOLHDL 4.5 4.2 3.8   TSH: No results for input(s): "TSH" in the last 8760 hours. A1C: Lab Results  Component Value Date   HGBA1C 7.6 (H) 04/05/2022     Assessment/Plan 1. Hyperlipidemia LDL goal <70 -LDL at goal, continues on statin.   2. Essential hypertension -Blood pressure well controlled, goal bp <140/90 Continue current medications and dietary modifications follow metabolic panel  3. Diabetes mellitus type 2 in obese ([place]) -not at goal discussing changing medication/adding insulin for better glycemic control. He is planning on working on a meal plan and lifestyle changes to help manage his A1c -will  continue current medication and follow up A1c in 3 month -Encouraged routine foot care/monitoring and to keep up with diabetic eye exams through ophthalmology  - CMP with eGFR([facility]); Future - Hemoglobin A1c; Future  4. Elevated liver enzymes -low fat diet recommended, continue to follow - CMP with eGFR([facility]); Future  5. Obesity -education provided on healthy weight loss through increase in physical activity and proper nutrition    Return in about 3 months (around 07/13/2022) for routine follow up, labs prior. [facility] American. [place], [facility] [phone]

## 2022-04-18 ENCOUNTER — Other Ambulatory Visit: Payer: Self-pay | Admitting: Nurse Practitioner

## 2022-04-18 DIAGNOSIS — E1169 Type 2 diabetes mellitus with other specified complication: Secondary | ICD-10-CM

## 2022-05-04 ENCOUNTER — Ambulatory Visit
Admission: EM | Admit: 2022-05-04 | Discharge: 2022-05-04 | Disposition: A | Discharge status: Home / Self Care | Payer: Managed Care, Other (non HMO) | Attending: Family Medicine | Admitting: Family Medicine

## 2022-05-04 ENCOUNTER — Other Ambulatory Visit: Payer: Self-pay

## 2022-05-04 DIAGNOSIS — H6691 Otitis media, unspecified, right ear: Secondary | ICD-10-CM

## 2022-05-04 DIAGNOSIS — H109 Unspecified conjunctivitis: Secondary | ICD-10-CM | POA: Diagnosis not present

## 2022-05-04 DIAGNOSIS — R059 Cough, unspecified: Secondary | ICD-10-CM

## 2022-05-04 MED ORDER — MOXIFLOXACIN HCL 0.5 % OP SOLN: 1.0000 [drp] | Freq: Three times a day (TID) | OPHTHALMIC | 0 refills | Status: AC

## 2022-05-04 MED ORDER — AMOXICILLIN-POT CLAVULANATE 875-125 MG PO TABS: 1.0000 | ORAL_TABLET | Freq: Two times a day (BID) | ORAL | 0 refills | Status: DC

## 2022-05-04 MED ORDER — BENZONATATE 200 MG PO CAPS: 200.0000 mg | ORAL_CAPSULE | Freq: Three times a day (TID) | ORAL | 0 refills | Status: AC | PRN

## 2022-05-04 NOTE — Discharge Instructions (Addendum)
Advised patient to take medications as directed with food to completion.  Advised patient to instill eyedrops as directed.  Encourage patient to increase daily water intake to 64 ounces per day while taking these medications.  Encourage patient to not submerge head underwater for the next 10 days.  Advised if symptoms worsen and/or unresolved please follow-up with ophthalmology, PCP or here for further evaluation.

## 2022-05-04 NOTE — ED Triage Notes (Signed)
Cough, nasal congestion, right ear and eye pain onset since Thursday.

## 2022-05-04 NOTE — ED Provider Notes (Signed)
[facility]    CSN: [id number] Arrival date & time: 05/04/22  1106      History   Chief Complaint Chief Complaint  Patient presents with   Cough    HPI Kyle Mcclure is a 61 y.o. male.   HPI Very plea84 year old male presents with cough, nasal congestion, right ear pain for 2 days.  PMH significant for morbid obesity, HTN, and T2DM.  Past Medical History:  Diagnosis Date   Fatty liver    followed by dr [name]--- mild elevated LFT since 2015,  blood work-up done 05/ 2020 (epic), and abd. ultrasound 10-16-2018 (epic),  recommendation loss weight   History of kidney stones    Hyperlipidemia    Hypertension    Seasonal allergies    Type 2 diabetes mellitus (HCC)    followed by pcp--  fasting cbg-- 140;  had been checking blood surgar past few weeks   Ureteral calculi    right   Wears contact lenses     Patient Active Problem List   Diagnosis Date Noted   Diabetes mellitus type 2 in obese (HCC) 04/05/2014   Hyperlipidemia LDL goal <70 12/21/2013   Class 2 severe obesity with serious comorbidity and body mass index (BMI) of 35.0 to 35.9 in adult [facility]) 06/23/2013   Essential hypertension 06/15/2013   BPH (benign prostatic hyperplasia) 06/15/2013   Allergic rhinitis 06/15/2013    Past Surgical History:  Procedure Laterality Date   COLONOSCOPY WITH PROPOFOL  03-13-2015  dr [name]   CYSTOSCOPY WITH HOLMIUM LASER LITHOTRIPSY  12-23-2003   dr Vernie Ammons  @[facility]    CYSTOSCOPY/RETROGRADE/URETEROSCOPY/STONE EXTRACTION WITH BASKET Right 03/26/2019   Procedure: CYSTOSCOPY/RETROGRADE/URETEROSCOPY/STONE EXTRACTION WITH BASKET/ STENT PLACEMENT;  Surgeon: 03/28/2019, MD;  Location: [facility] [place];  Service: Urology;  Laterality: Right;   EXTRACORPOREAL SHOCK WAVE LITHOTRIPSY  11-05-2012   @[facility]    TONSILLECTOMY  child       Home Medications    Prior to Admission medications   Medication Sig Start Date End Date Taking? Authorizing Provider   amoxicillin-clavulanate (AUGMENTIN) 875-125 MG tablet Take 1 tablet by mouth every 12 (twelve) hours. 05/04/22  Yes , FNP  benzonatate (TESSALON) 200 MG capsule Take 1 capsule (200 mg total) by mouth 3 (three) times daily as needed for up to 7 days. 05/04/22 05/11/22 Yes 14/2/23, FNP  moxifloxacin (VIGAMOX) 0.5 % ophthalmic solution Place 1 drop into both eyes 3 (three) times daily for 5 days. 05/04/22 05/09/22 Yes 14/2/23, FNP  aspirin 81 MG tablet Take 81 mg by mouth daily.    [provider]  atorvastatin (LIPITOR) 20 MG tablet TAKE 1 TABLET BY MOUTH EVERY DAY 08/10/21   Trevor Iha, NP  cetirizine (ZYRTEC) 10 MG tablet Take 10 mg by mouth daily.    [provider]  Dulaglutide (TRULICITY) 3 MG/0.5ML SOPN INJECT 3 MG AS DIRECTED ONCE A WEEK. 02/01/22   [name], NP  empagliflozin (JARDIANCE) 25 MG TABS tablet TAKE 1 TABLET (25 MG TOTAL) BY MOUTH DAILY. 04/18/22   [name], NP  fenofibrate (TRICOR) 145 MG tablet Take 1 tablet (145 mg total) by mouth daily. (E78.5) 07/04/21   [name], NP  fluticasone (FLONASE) 50 MCG/ACT nasal spray Place 1 spray into both nostrils as needed for allergies or rhinitis. 09/28/18   [name], NP  glucose blood [facility] VERIO) test strip Use to test blood sugar twice daily Dx: E11.69 09/28/18   10-29[birth year]1992, NP  ibuprofen (ADVIL) 200 MG tablet Take 200 mg by mouth every 6 (six) hours as needed.    [provider]  Lancet Devices (ONE TOUCH DELICA LANCING DEV) MISC Check blood sugar 2 x daily as directed DX: 250.02 09/28/18   Lauree Chandler, NP  lisinopril (ZESTRIL) 10 MG tablet TAKE 1 TABLET BY MOUTH EVERY DAY 02/11/22   Lauree Chandler, NP  metFORMIN (GLUCOPHAGE) 1000 MG tablet TAKE 1 TABLET BY MOUTH 2 TIMES DAILY WITH A MEAL. 08/10/21   Lauree Chandler, NP  naproxen sodium (ALEVE) 220 MG tablet Take 220 mg by mouth as needed.    [provider]   pioglitazone (ACTOS) 15 MG tablet TAKE 1 TABLET BY MOUTH EVERY DAY 03/04/22   Lauree Chandler, NP  potassium citrate (UROCIT-K) 10 MEQ (1080 MG) SR tablet at bedtime. 2 by mouth once daily to prevent kidney stones 09/21/13   Blanchie Serve, MD    Family History Family History  Problem Relation Age of Onset   Cancer Mother    Cataracts Father    Colon cancer Neg Hx    Rectal cancer Neg Hx    Stomach cancer Neg Hx    Esophageal cancer Neg Hx     Social History Social History   Tobacco Use   Smoking status: Former    Packs/day: 0.50    Years: 3.00    Total pack years: 1.50    Types: Cigarettes    Quit date: 06/03/1990    Years since quitting: 31.9   Smokeless tobacco: Never  Vaping Use   Vaping Use: Never used  Substance Use Topics   Alcohol use: Yes    Alcohol/week: 4.0 standard drinks of alcohol    Types: 4 Standard drinks or equivalent per week    Comment: Beer off/on    Drug use: Never     Allergies   Hydrocodone and Oxycodone   Review of Systems Review of Systems  HENT:  Positive for ear pain.   Respiratory:  Positive for cough.   All other systems reviewed and are negative.    Physical Exam Triage Vital Signs ED Triage Vitals  Enc Vitals Group     BP 05/04/22 1116 112/74     Pulse Rate 05/04/22 1116 100     Resp 05/04/22 1116 17     Temp 05/04/22 1116 98.4 F (36.9 C)     Temp Source 05/04/22 1116 Oral     SpO2 05/04/22 1116 96 %     Weight 05/04/22 1118 232 lb (105.2 kg)     Height --      Head Circumference --      Peak Flow --      Pain Score 05/04/22 1118 6     Pain Loc --      Pain Edu? --      Excl. in [place]? --    No data found.  Updated Vital Signs BP 112/74 (BP Location: Left Arm)   Pulse 100   Temp 98.4 F (36.9 C) (Oral)   Resp 17   Wt 232 lb (105.2 kg)   SpO2 96%   BMI 35.28 kg/m   Physical Exam Vitals and nursing note reviewed.  Constitutional:      Appearance: Normal appearance. He is obese. He is ill-appearing.   HENT:     Head: Normocephalic and atraumatic.     Right Ear: External ear normal.     Left Ear: Tympanic membrane, ear canal and external ear  normal.     Ears:     Comments: Right TM: Erythematous, bulging    Mouth/Throat:     Mouth: Mucous membranes are moist.     Pharynx: Oropharynx is clear.  Eyes:     Extraocular Movements: Extraocular movements intact.     Conjunctiva/sclera: Conjunctivae normal.     Pupils: Pupils are equal, round, and reactive to light.     Comments: Sclera with +2 injection with mucopurulent crusted noting of inner canthus bilaterally  Cardiovascular:     Rate and Rhythm: Normal rate and regular rhythm.     Pulses: Normal pulses.     Heart sounds: Normal heart sounds.  Pulmonary:     Effort: Pulmonary effort is normal.     Breath sounds: Normal breath sounds. No wheezing, rhonchi or rales.  Musculoskeletal:        General: Normal range of motion.     Cervical back: Normal range of motion and neck supple.  Skin:    General: Skin is warm and dry.  Neurological:     General: No focal deficit present.     Mental Status: He is alert and oriented to person, place, and time.      UC Treatments / Results  Labs (all labs ordered are listed, but only abnormal results are displayed) Labs Reviewed - No data to display  EKG   Radiology No results found.  Procedures Procedures (including critical care time)  Medications Ordered in UC Medications - No data to display  Initial Impression / Assessment and Plan / UC Course  I have reviewed the triage vital signs and the nursing notes.  Pertinent labs & imaging results that were available during my care of the patient were reviewed by me and considered in my medical decision making (see chart for details).     MDM: 1.  Acute right otitis media-Rx'd Augmentin; 2.  Conjunctivitis of both eyes, unspecified conjunctivitis type-Rx'd Vigamox. 3. Cough-Rx'd Tessalon. Advised patient to take medications as  directed with food to completion.  Advised patient to instill eyedrops as directed.  Encourage patient to increase daily water intake to 64 ounces per day while taking these medications.  Encourage patient to not submerge head underwater for the next 10 days.  Advised if symptoms worsen and/or unresolved please follow-up with ophthalmology, PCP or here for further evaluation.  Discharged home, hemodynamically stable. Final Clinical Impressions(s) / UC Diagnoses   Final diagnoses:  Conjunctivitis of both eyes, unspecified conjunctivitis type  Acute right otitis media  Cough, unspecified type     Discharge Instructions      Advised patient to take medications as directed with food to completion.  Advised patient to instill eyedrops as directed.  Encourage patient to increase daily water intake to 64 ounces per day while taking these medications.  Encourage patient to not submerge head underwater for the next 10 days.  Advised if symptoms worsen and/or unresolved please follow-up with ophthalmology, PCP or here for further evaluation.     ED Prescriptions     Medication Sig Dispense Auth. Provider   amoxicillin-clavulanate (AUGMENTIN) 875-125 MG tablet Take 1 tablet by mouth every 12 (twelve) hours. 14 tablet Trevor Iha, FNP   moxifloxacin (VIGAMOX) 0.5 % ophthalmic solution Place 1 drop into both eyes 3 (three) times daily for 5 days. 3 mL Trevor Iha, FNP   benzonatate (TESSALON) 200 MG capsule Take 1 capsule (200 mg total) by mouth 3 (three) times daily as needed for up to 7 days.  40 capsule Eliezer Lofts, FNP      PDMP not reviewed this encounter.   Eliezer Lofts, [place] 05/04/22 1241

## 2022-05-09 ENCOUNTER — Ambulatory Visit
Admission: EM | Admit: 2022-05-09 | Discharge: 2022-05-09 | Disposition: A | Discharge status: Home / Self Care | Payer: 59 | Attending: Physician Assistant | Admitting: Physician Assistant

## 2022-05-09 DIAGNOSIS — J9801 Acute bronchospasm: Secondary | ICD-10-CM | POA: Diagnosis not present

## 2022-05-09 DIAGNOSIS — J40 Bronchitis, not specified as acute or chronic: Secondary | ICD-10-CM | POA: Diagnosis not present

## 2022-05-09 MED ORDER — IPRATROPIUM-ALBUTEROL 0.5-2.5 (3) MG/3ML IN SOLN
3.0000 mL | Freq: Once | RESPIRATORY_TRACT | Status: AC
  Administered 2022-05-09: 3 mL via RESPIRATORY_TRACT

## 2022-05-09 MED ORDER — PREDNISONE 20 MG PO TABS: 40.0000 mg | ORAL_TABLET | Freq: Every day | ORAL | 0 refills | Status: AC

## 2022-05-09 MED ORDER — ALBUTEROL SULFATE HFA 108 (90 BASE) MCG/ACT IN AERS: 1.0000 | INHALATION_SPRAY | Freq: Four times a day (QID) | RESPIRATORY_TRACT | 0 refills | Status: DC | PRN

## 2022-05-09 NOTE — Discharge Instructions (Addendum)
Use albuterol inhaler every 4-6 hours as needed for shortness of breath and coughing fits.  Use over-the-counter medications to help you sleep and to manage your cough.  Start prednisone in the morning (05/10/2022).  Do not take NSAIDs with this medication including aspirin, ibuprofen/Advil, naproxen/Aleve.  Complete your course of Augmentin.  Use Mucinex, Flonase, Tylenol.  Make sure you rest and drink plenty of fluid.  The prednisone will raise her blood sugar so please avoid carbohydrates and make sure you are drinking plenty of fluid.  Monitor your blood sugar closely and if this is very elevated stop the prednisone and return here or see your primary care.  If you develop any chest pain, shortness of breath, worsening cough, wheezing despite medication you should be seen immediately.

## 2022-05-09 NOTE — ED Provider Notes (Signed)
[facility]    CSN: [id number] Arrival date & time: 05/09/22  1700      History   Chief Complaint Chief Complaint  Patient presents with   Cough    Cough, sore throat, nasal congestion and ear fullness. X1 week    HPI Kyle Mcclure is a 61 y.o. male.   Patient presents today with a week plus long history of URI symptoms including cough, congestion, fatigue, malaise.  He was seen 05/04/2022 at which point he was started on Augmentin for otitis media.  Reports that he has been taking this medication as prescribed and ear pain has resolved.  He continues to have significant cough though it has changed from productive to dry.  He is having difficulty sleeping at night as result of the cough.  He denies any additional antibiotics outside of the Augmentin prescribed recently by our clinic.  He has been using Mucinex over-the-counter without improvement of symptoms.  He does have a history of diabetes but his blood sugars are adequately controlled with A1c of 7.6% 04/05/2022.  He has taken steroids in the past for poison ivy without difficulty.  He denies history of asthma or COPD.  He does not smoke.    Past Medical History:  Diagnosis Date   Fatty liver    followed by dr Christella Hartiganjacobs--- mild elevated LFT since 2015,  blood work-up done 05/ 2020 (epic), and abd. ultrasound 10-16-2018 (epic),  recommendation loss weight   History of kidney stones    Hyperlipidemia    Hypertension    Seasonal allergies    Type 2 diabetes mellitus (HCC)    followed by pcp--  fasting cbg-- 140;  had been checking blood surgar past few weeks   Ureteral calculi    right   Wears contact lenses     Patient Active Problem List   Diagnosis Date Noted   Diabetes mellitus type 2 in obese (HCC) 04/05/2014   Hyperlipidemia LDL goal <70 12/21/2013   Class 2 severe obesity with serious comorbidity and body mass index (BMI) of 35.0 to 35.9 in adult [facility](HCC) 06/23/2013   Essential hypertension 06/15/2013   BPH  (benign prostatic hyperplasia) 06/15/2013   Allergic rhinitis 06/15/2013    Past Surgical History:  Procedure Laterality Date   COLONOSCOPY WITH PROPOFOL  03-13-2015  dr Christella Hartiganjacobs   CYSTOSCOPY WITH HOLMIUM LASER LITHOTRIPSY  12-23-2003   dr Vernie Ammonsottelin  @[facility]    CYSTOSCOPY/RETROGRADE/URETEROSCOPY/STONE EXTRACTION WITH BASKET Right 03/26/2019   Procedure: CYSTOSCOPY/RETROGRADE/URETEROSCOPY/STONE EXTRACTION WITH BASKET/ STENT PLACEMENT;  Surgeon: Ihor Gullyttelin, Mark, MD;  [facility] [place];  Service: Urology;  Laterality: Right;   EXTRACORPOREAL SHOCK WAVE LITHOTRIPSY  11-05-2012   @[facility]    TONSILLECTOMY  child       Home Medications    Prior to Admission medications   Medication Sig Start Date End Date Taking? Authorizing Provider  albuterol (VENTOLIN HFA) 108 (90 Base) MCG/ACT inhaler Inhale 1-2 puffs into the lungs every 6 (six) hours as needed for wheezing or shortness of breath. 05/09/22  Yes Eneida Evers K, PA-C  amoxicillin-clavulanate (AUGMENTIN) 875-125 MG tablet Take 1 tablet by mouth every 12 (twelve) hours. 05/04/22  Yes Trevor Ihaagan, Michael, FNP  aspirin 81 MG tablet Take 81 mg by mouth daily.   Yes [provider]  atorvastatin (LIPITOR) 20 MG tablet TAKE 1 TABLET BY MOUTH EVERY DAY 08/10/21  Yes Sharon SellerEubanks, Jessica K, NP  benzonatate (TESSALON) 200 MG capsule Take 1 capsule (200 mg total) by mouth 3 (three) times  daily as needed for up to 7 days. 05/04/22 05/11/22 Yes Trevor Iha, FNP  cetirizine (ZYRTEC) 10 MG tablet Take 10 mg by mouth daily.   Yes [provider]  Dulaglutide (TRULICITY) 3 MG/0.5ML SOPN INJECT 3 MG AS DIRECTED ONCE A WEEK. 02/01/22  Yes [name], NP  empagliflozin (JARDIANCE) 25 MG TABS tablet TAKE 1 TABLET (25 MG TOTAL) BY MOUTH DAILY. 04/18/22  Yes [name], NP  fenofibrate (TRICOR) 145 MG tablet Take 1 tablet (145 mg total) by mouth daily. (E78.5) 07/04/21  Yes [name], NP  fluticasone (FLONASE) 50 MCG/ACT nasal  spray Place 1 spray into both nostrils as needed for allergies or rhinitis. 09/28/18  Yes [name], NP  glucose blood (ONETOUCH VERIO) test strip Use to test blood sugar twice daily Dx: E11.69 09/28/18  Yes [name], NP  ibuprofen (ADVIL) 200 MG tablet Take 200 mg by mouth every 6 (six) hours as needed.   Yes [provider]  Lancet Devices (ONE TOUCH DELICA LANCING DEV) MISC Check blood sugar 2 x daily as directed DX: 250.02 09/28/18  Yes Eubanks, Janene Harvey, NP  lisinopril (ZESTRIL) 10 MG tablet TAKE 1 TABLET BY MOUTH EVERY DAY 02/11/22  Yes [name], NP  metFORMIN (GLUCOPHAGE) 1000 MG tablet TAKE 1 TABLET BY MOUTH 2 TIMES DAILY WITH A MEAL. 08/10/21  Yes [name], NP  moxifloxacin (VIGAMOX) 0.5 % ophthalmic solution Place 1 drop into both eyes 3 (three) times daily for 5 days. 05/04/22 05/09/22 Yes Trevor Iha, FNP  naproxen sodium (ALEVE) 220 MG tablet Take 220 mg by mouth as needed.   Yes [provider]  pioglitazone (ACTOS) 15 MG tablet TAKE 1 TABLET BY MOUTH EVERY DAY 03/04/22  Yes [name], NP  potassium citrate (UROCIT-K) 10 MEQ (1080 MG) SR tablet at bedtime. 2 by mouth once daily to prevent kidney stones 09/21/13  Yes Oneal Grout, MD  predniSONE (DELTASONE) 20 MG tablet Take 2 tablets (40 mg total) by mouth daily for 5 days. 05/09/22 05/14/22 Yes Cutter Passey, Noberto Retort, PA-C    Family History Family History  Problem Relation Age of Onset   Cancer Mother    Cataracts Father    Colon cancer Neg Hx    Rectal cancer Neg Hx    Stomach cancer Neg Hx    Esophageal cancer Neg Hx     Social History Social History   Tobacco Use   Smoking status: Former    Packs/day: 0.50    Years: 3.00    Total pack years: 1.50    Types: Cigarettes    Quit date: 06/03/1990    Years since quitting: 31.9   Smokeless tobacco: Never  Vaping Use   Vaping Use: Never used  Substance Use Topics   Alcohol use: Yes    Alcohol/week: 4.0 standard  drinks of alcohol    Types: 4 Standard drinks or equivalent per week    Comment: Beer off/on    Drug use: Never     Allergies   Hydrocodone and Oxycodone   Review of Systems Review of Systems  Constitutional:  Positive for activity change, appetite change and fatigue. Negative for fever.  HENT:  Positive for congestion and sinus pressure. Negative for sneezing and sore throat.   Respiratory:  Positive for cough, chest tightness and shortness of breath. Negative for wheezing.   Cardiovascular:  Negative for chest pain.     Physical Exam Triage Vital Signs ED Triage  Vitals  Enc Vitals Group     BP 05/09/22 1715 118/81     Pulse Rate 05/09/22 1715 95     Resp 05/09/22 1715 18     Temp 05/09/22 1715 98.5 F (36.9 C)     Temp Source 05/09/22 1715 Oral     SpO2 05/09/22 1715 97 %     Weight 05/09/22 1713 232 lb (105.2 kg)     Height 05/09/22 1713 5\' 8"  (1.727 m)     Head Circumference --      Peak Flow --      Pain Score 05/09/22 1713 7     Pain Loc --      Pain Edu? --      Excl. in [place]? --    No data found.  Updated Vital Signs BP 118/81 (BP Location: Left Arm)   Pulse 95   Temp 98.5 F (36.9 C) (Oral)   Resp 18   Ht 5\' 8"  (1.727 m)   Wt 232 lb (105.2 kg)   SpO2 97%   BMI 35.28 kg/m   Visual Acuity Right Eye Distance:   Left Eye Distance:   Bilateral Distance:    Right Eye Near:   Left Eye Near:    Bilateral Near:     Physical Exam Vitals reviewed.  Constitutional:      General: He is awake.     Appearance: Normal appearance. He is well-developed. He is not ill-appearing.     Comments: Very pleasant male appears stated age in no acute distress sitting comfortably in exam room  HENT:     Head: Normocephalic and atraumatic.     Right Ear: Tympanic membrane, ear canal and external ear normal. Tympanic membrane is not erythematous or bulging.     Left Ear: Tympanic membrane, ear canal and external ear normal. Tympanic membrane is not erythematous or  bulging.     Nose: Nose normal.     Mouth/Throat:     Pharynx: Uvula midline. Posterior oropharyngeal erythema present. No oropharyngeal exudate.  Cardiovascular:     Rate and Rhythm: Normal rate and regular rhythm.     Heart sounds: Normal heart sounds, S1 normal and S2 normal. No murmur heard. Pulmonary:     Effort: Pulmonary effort is normal. No accessory muscle usage or respiratory distress.     Breath sounds: No stridor. Examination of the right-lower field reveals decreased breath sounds. Examination of the left-lower field reveals decreased breath sounds. Decreased breath sounds and wheezing present. No rhonchi or rales.     Comments: Reactive cough with deep breathing Neurological:     Mental Status: He is alert.  Psychiatric:        Behavior: Behavior is cooperative.      UC Treatments / Results  Labs (all labs ordered are listed, but only abnormal results are displayed) Labs Reviewed - No data to display  EKG   Radiology No results found.  Procedures Procedures (including critical care time)  Medications Ordered in UC Medications  ipratropium-albuterol (DUONEB) 0.5-2.5 (3) MG/3ML nebulizer solution 3 mL (has no administration in time range)    Initial Impression / Assessment and Plan / UC Course  I have reviewed the triage vital signs and the nursing notes.  Pertinent labs & imaging results that were available during my care of the patient were reviewed by me and considered in my medical decision making (see chart for details).     Patient is well-appearing, afebrile, nontoxic, nontachycardic.  Chest x-ray was  deferred as patient has oxygen saturation of 97% with no adventitious lung sounds on exam.  He was encouraged to complete course of Augmentin as previously prescribed.  He was given DuoNeb in clinic with significant improvement of symptoms.  Prescription for albuterol was sent to pharmacy with instruction to use this every 4-6 hours as needed.  Will start  prednisone burst to help manage symptoms.  Discussed that he is not to take NSAIDs with this medication due to risk of GI bleeding.  His blood sugars have been well-controlled we discussed that hyperglycemia is a common side effect of prednisone.  He is to monitor his blood sugar closely and be evaluated if this is persistently elevated.  Recommended that he avoid carbohydrates and drink plenty fluid.  He can use over-the-counter medications including Mucinex, Flonase, Tylenol for symptom relief.  Recommended that he rest and drink plenty of fluid.  He is to follow-up with his primary care if symptoms have not significantly improved by next week.  Discussed that if he has any worsening symptoms including worsening cough, shortness of breath, increased wheezing despite medication, nausea, vomiting, weakness he needs to be seen immediately.  Strict return precautions given.  Patient declined work excuse note.  Final Clinical Impressions(s) / UC Diagnoses   Final diagnoses:  Bronchitis  Bronchospasm     Discharge Instructions      Use albuterol inhaler every 4-6 hours as needed for shortness of breath and coughing fits.  Use over-the-counter medications to help you sleep and to manage your cough.  Start prednisone in the morning (05/10/2022).  Do not take NSAIDs with this medication including aspirin, ibuprofen/Advil, naproxen/Aleve.  Complete your course of Augmentin.  Use Mucinex, Flonase, Tylenol.  Make sure you rest and drink plenty of fluid.  The prednisone will raise her blood sugar so please avoid carbohydrates and make sure you are drinking plenty of fluid.  Monitor your blood sugar closely and if this is very elevated stop the prednisone and return here or see your primary care.  If you develop any chest pain, shortness of breath, worsening cough, wheezing despite medication you should be seen immediately.     ED Prescriptions     Medication Sig Dispense Auth. Provider   albuterol (VENTOLIN  HFA) 108 (90 Base) MCG/ACT inhaler Inhale 1-2 puffs into the lungs every 6 (six) hours as needed for wheezing or shortness of breath. 18 g Michelle Vanhise K, PA-C   predniSONE (DELTASONE) 20 MG tablet Take 2 tablets (40 mg total) by mouth daily for 5 days. 10 tablet Labria Wos, Noberto Retort, PA-C      PDMP not reviewed this encounter.   Jeani Hawking, PA-C 05/09/22 1744

## 2022-05-09 NOTE — ED Triage Notes (Signed)
Pt states that he was recently seen and isn't any better. Pt states that he has a cough, nasal congestion, sore throat, ear fullness and loss of voice. X1 week

## 2022-05-29 ENCOUNTER — Encounter: Payer: Self-pay | Admitting: Nurse Practitioner

## 2022-05-29 ENCOUNTER — Other Ambulatory Visit: Payer: Self-pay

## 2022-05-29 DIAGNOSIS — E1169 Type 2 diabetes mellitus with other specified complication: Secondary | ICD-10-CM

## 2022-05-29 MED ORDER — TRULICITY 3 MG/0.5ML ~~LOC~~ SOAJ: 3.0000 mg | SUBCUTANEOUS | 3 refills | Status: DC

## 2022-05-30 NOTE — Telephone Encounter (Signed)
Tried doing Prior Authorization on Jardiance and Cover My Meds states that a Prior Authorization is NOT required for the Jardiance.   Patient stated that he received a letter stating that it is a "step" medication.   Please Advise.

## 2022-05-31 ENCOUNTER — Encounter: Payer: Self-pay | Admitting: Nurse Practitioner

## 2022-05-31 ENCOUNTER — Other Ambulatory Visit: Payer: Self-pay

## 2022-05-31 ENCOUNTER — Telehealth: Payer: Self-pay

## 2022-05-31 DIAGNOSIS — E1169 Type 2 diabetes mellitus with other specified complication: Secondary | ICD-10-CM

## 2022-05-31 MED ORDER — EMPAGLIFLOZIN 25 MG PO TABS: 25.0000 mg | ORAL_TABLET | Freq: Every day | ORAL | 3 refills | Status: DC

## 2022-05-31 NOTE — Telephone Encounter (Signed)
Attempted to call patient see if he would [facility] we received the form for Jardiance 25mg 

## 2022-06-03 ENCOUNTER — Encounter: Payer: Self-pay | Admitting: Nurse Practitioner

## 2022-06-03 ENCOUNTER — Other Ambulatory Visit: Payer: Self-pay | Admitting: Nurse Practitioner

## 2022-06-03 DIAGNOSIS — E785 Hyperlipidemia, unspecified: Secondary | ICD-10-CM

## 2022-06-05 ENCOUNTER — Other Ambulatory Visit: Payer: Self-pay

## 2022-06-05 DIAGNOSIS — E785 Hyperlipidemia, unspecified: Secondary | ICD-10-CM

## 2022-06-05 DIAGNOSIS — I10 Essential (primary) hypertension: Secondary | ICD-10-CM

## 2022-06-05 MED ORDER — FENOFIBRATE 145 MG PO TABS: 145.0000 mg | ORAL_TABLET | Freq: Every day | ORAL | 1 refills | Status: DC

## 2022-06-05 MED ORDER — LISINOPRIL 10 MG PO TABS: 10.0000 mg | ORAL_TABLET | Freq: Every day | ORAL | 1 refills | Status: DC

## 2022-06-05 NOTE — Telephone Encounter (Signed)
called patient to let him know that both medications were sent to the pharmacy.

## 2022-06-21 ENCOUNTER — Other Ambulatory Visit: Payer: Self-pay

## 2022-06-21 DIAGNOSIS — E1169 Type 2 diabetes mellitus with other specified complication: Secondary | ICD-10-CM

## 2022-06-21 DIAGNOSIS — E785 Hyperlipidemia, unspecified: Secondary | ICD-10-CM

## 2022-06-21 DIAGNOSIS — R748 Abnormal levels of other serum enzymes: Secondary | ICD-10-CM

## 2022-06-21 MED ORDER — PIOGLITAZONE HCL 15 MG PO TABS: 15.0000 mg | ORAL_TABLET | Freq: Every day | ORAL | 1 refills | Status: DC

## 2022-06-21 MED ORDER — ATORVASTATIN CALCIUM 20 MG PO TABS: 20.0000 mg | ORAL_TABLET | Freq: Every day | ORAL | 1 refills | Status: DC

## 2022-06-21 MED ORDER — METFORMIN HCL 1000 MG PO TABS: 1000.0000 mg | ORAL_TABLET | Freq: Two times a day (BID) | ORAL | 1 refills | Status: DC

## 2022-07-05 ENCOUNTER — Other Ambulatory Visit: Payer: 59

## 2022-07-05 DIAGNOSIS — R748 Abnormal levels of other serum enzymes: Secondary | ICD-10-CM

## 2022-07-05 DIAGNOSIS — E1169 Type 2 diabetes mellitus with other specified complication: Secondary | ICD-10-CM

## 2022-07-06 LAB — COMPLETE METABOLIC PANEL WITH GFR
AG Ratio: 1.8 (calc) (ref 1.0–2.5)
ALT: 36 U/L (ref 9–46)
AST: 35 U/L (ref 10–35)
Albumin: 4.6 g/dL (ref 3.6–5.1)
Alkaline phosphatase (APISO): 58 U/L (ref 35–144)
BUN: 20 mg/dL (ref 7–25)
CO2: 27 mmol/L (ref 20–32)
Calcium: 10.2 mg/dL (ref 8.6–10.3)
Chloride: 100 mmol/L (ref 98–110)
Creat: 1.16 mg/dL (ref 0.70–1.35)
Globulin: 2.5 g/dL (calc) (ref 1.9–3.7)
Glucose, Bld: 152 mg/dL — ABNORMAL HIGH (ref 65–99)
Potassium: 4.6 mmol/L (ref 3.5–5.3)
Sodium: 137 mmol/L (ref 135–146)
Total Bilirubin: 0.9 mg/dL (ref 0.2–1.2)
Total Protein: 7.1 g/dL (ref 6.1–8.1)
eGFR: 72 mL/min/{1.73_m2} (ref >=60)

## 2022-07-06 LAB — HEMOGLOBIN A1C
Hgb A1c MFr Bld: 7.7 % of total Hgb — ABNORMAL HIGH (ref <5.7)
Mean Plasma Glucose: 174 mg/dL
eAG (mmol/L): 9.7 mmol/L

## 2022-07-12 ENCOUNTER — Ambulatory Visit: Payer: 59 | Admitting: Nurse Practitioner

## 2022-07-12 ENCOUNTER — Encounter: Payer: Self-pay | Admitting: Nurse Practitioner

## 2022-07-12 VITALS — BP 122/74 | HR 84 | Temp 97.8°F | Resp 17 | Ht 68.0 in | Wt 226.4 lb

## 2022-07-12 DIAGNOSIS — J309 Allergic rhinitis, unspecified: Secondary | ICD-10-CM | POA: Diagnosis not present

## 2022-07-12 DIAGNOSIS — E661 Drug-induced obesity: Secondary | ICD-10-CM

## 2022-07-12 DIAGNOSIS — Z6834 Body mass index (BMI) 34.0-34.9, adult: Secondary | ICD-10-CM

## 2022-07-12 DIAGNOSIS — E1169 Type 2 diabetes mellitus with other specified complication: Secondary | ICD-10-CM

## 2022-07-12 DIAGNOSIS — E669 Obesity, unspecified: Secondary | ICD-10-CM

## 2022-07-12 MED ORDER — TIRZEPATIDE 2.5 MG/0.5ML ~~LOC~~ SOAJ: 2.5000 mg | SUBCUTANEOUS | 0 refills | Status: DC

## 2022-07-12 MED ORDER — TIRZEPATIDE 5 MG/0.5ML ~~LOC~~ SOAJ: 5.0000 mg | SUBCUTANEOUS | 1 refills | Status: DC

## 2022-07-12 NOTE — Progress Notes (Signed)
Careteam: Patient Care Team: Lauree Chandler, NP as PCP - General (Nurse Practitioner) Sharol Roussel, OD as Consulting Physician (Optometry) Kathie Rhodes, MD (Inactive) as Consulting Physician (Urology)  PLACE OF SERVICE:  [facility]  Advanced Directive information    Allergies  Allergen Reactions   Hydrocodone Nausea And Vomiting   Oxycodone Nausea And Vomiting    Chief Complaint  Patient presents with   Medical Management of Chronic Issues    3 month follow up   Quality Metric Gaps    Discussed the need for Foot exam and Ophthalmology exam     HPI: Patient is a 62 y.o. male here for routine follow-up.   He had a couple of ED visits related to an URI in December for ear infection and bronchitis which has since resolved. Took Augmentin, prednisone, duoneb.Doing much better now, allergies are kicking in so he is taking his neti-pot rinse and wondering what he should switch his daily antihistamine to, from zyrtec. He continues with flonase. Has a little bit of blood from blowing his nose all the time.   AIC is creeping up. He is supposed to be taking trulicity but has been unable to get it since November due to it being out of stock. Now it is back in stock but his insurance is refusing to pay. His wife is on [place] and he is wondering if he should switch to this because his insurance pays for it.   He says he eats limited carbs, more protein. He doesn't drink sugary drinks, he drinks black coffee and water. He does eat some sweets but he tries to limit it. He is very active working outdoors with his wife and has lost 6 lbs but no purposeful exercise.   He has an eye exam on the 19th with Dr. Katy Fitch.   Review of Systems:  Review of Systems  Constitutional:  Negative for chills, fever, malaise/fatigue and weight loss.  HENT:  Negative for congestion and sore throat.   Eyes:  Negative for blurred vision.  Respiratory:  Negative for cough, shortness of breath and  wheezing.   Cardiovascular:  Negative for chest pain, palpitations and leg swelling.  Gastrointestinal:  Negative for abdominal pain, blood in stool, constipation, diarrhea, heartburn, nausea and vomiting.  Genitourinary:  Negative for dysuria, frequency, hematuria and urgency.  Musculoskeletal:  Negative for falls and joint pain.  Skin:  Negative for rash.  Neurological:  Negative for dizziness, tingling and headaches.  Endo/Heme/Allergies:  Negative for polydipsia.  Psychiatric/Behavioral:  Negative for depression. The patient is not nervous/anxious.     Past Medical History:  Diagnosis Date   Fatty liver    followed by dr Ardis Hughs--- mild elevated LFT since 2015,  blood work-up done 05/ 2020 (epic), and abd. ultrasound 10-16-2018 (epic),  recommendation loss weight   History of kidney stones    Hyperlipidemia    Hypertension    Seasonal allergies    Type 2 diabetes mellitus ([place])    followed by pcp--  fasting cbg-- 140;  had been checking blood surgar past few weeks   Ureteral calculi    right   Wears contact lenses    Past Surgical History:  Procedure Laterality Date   COLONOSCOPY WITH PROPOFOL  03-13-2015  dr Ardis Hughs   CYSTOSCOPY WITH HOLMIUM LASER LITHOTRIPSY  12-23-2003   dr Karsten Ro  @[facility]    CYSTOSCOPY/RETROGRADE/URETEROSCOPY/STONE EXTRACTION WITH BASKET Right 03/26/2019   Procedure: CYSTOSCOPY/RETROGRADE/URETEROSCOPY/STONE EXTRACTION WITH BASKET/ STENT PLACEMENT;  Surgeon: Kathie Rhodes, MD;  Location: [facility];  Service: Urology;  Laterality: Right;   EXTRACORPOREAL SHOCK WAVE LITHOTRIPSY  11-05-2012   @[facility]    TONSILLECTOMY  child   Social History:   reports that he quit smoking about 32 years ago. His smoking use included cigarettes. He has a 1.50 pack-year smoking history. He has never used smokeless tobacco. He reports current alcohol use of about 4.0 standard drinks of alcohol per week. He reports that he does not use drugs.  Family History  Problem  Relation Age of Onset   Cancer Mother    Cataracts Father    Colon cancer Neg Hx    Rectal cancer Neg Hx    Stomach cancer Neg Hx    Esophageal cancer Neg Hx     Medications: Patient's Medications  New Prescriptions   TIRZEPATIDE (MOUNJARO) 2.5 MG/0.5ML PEN    Inject 2.5 mg into the skin once a week.   TIRZEPATIDE (MOUNJARO) 5 MG/0.5ML PEN    Inject 5 mg into the skin once a week.  Previous Medications   ALBUTEROL (VENTOLIN HFA) 108 (90 BASE) MCG/ACT INHALER    Inhale 1-2 puffs into the lungs every 6 (six) hours as needed for wheezing or shortness of breath.   AMOXICILLIN-CLAVULANATE (AUGMENTIN) 875-125 MG TABLET    Take 1 tablet by mouth every 12 (twelve) hours.   ASPIRIN 81 MG TABLET    Take 81 mg by mouth daily.   ATORVASTATIN (LIPITOR) 20 MG TABLET    Take 1 tablet (20 mg total) by mouth daily.   CETIRIZINE (ZYRTEC) 10 MG TABLET    Take 10 mg by mouth daily.   EMPAGLIFLOZIN (JARDIANCE) 25 MG TABS TABLET    Take 1 tablet (25 mg total) by mouth daily.   FENOFIBRATE (TRICOR) 145 MG TABLET    Take 1 tablet (145 mg total) by mouth daily. (E78.5)   FLUTICASONE (FLONASE) 50 MCG/ACT NASAL SPRAY    Place 1 spray into both nostrils as needed for allergies or rhinitis.   GLUCOSE BLOOD (ONETOUCH VERIO) TEST STRIP    Use to test blood sugar twice daily Dx: E11.69   IBUPROFEN (ADVIL) 200 MG TABLET    Take 200 mg by mouth every 6 (six) hours as needed.   LANCET DEVICES (ONE TOUCH DELICA LANCING DEV) MISC    Check blood sugar 2 x daily as directed DX: 250.02   LISINOPRIL (ZESTRIL) 10 MG TABLET    Take 1 tablet (10 mg total) by mouth daily.   METFORMIN (GLUCOPHAGE) 1000 MG TABLET    Take 1 tablet (1,000 mg total) by mouth 2 (two) times daily with a meal.   NAPROXEN SODIUM (ALEVE) 220 MG TABLET    Take 220 mg by mouth as needed.   PIOGLITAZONE (ACTOS) 15 MG TABLET    Take 1 tablet (15 mg total) by mouth daily.   POTASSIUM CITRATE (UROCIT-K) 10 MEQ (1080 MG) SR TABLET    at bedtime. 2 by mouth once  daily to prevent kidney stones  Modified Medications   No medications on file  Discontinued Medications   DULAGLUTIDE (TRULICITY) 3 0000000 SOPN    Inject 3 mg as directed once a week.    Physical Exam:  Vitals:   07/12/22 0823  BP: 122/74  Pulse: 84  Resp: 17  Temp: 97.8 F (36.6 C)  TempSrc: Temporal  SpO2: 96%  Weight: 226 lb 6.4 oz (102.7 kg)  Height: 5' 8"$  (1.727 m)   Body mass index is 34.42 kg/m. Wt Readings  from Last 3 Encounters:  07/12/22 226 lb 6.4 oz (102.7 kg)  05/09/22 232 lb (105.2 kg)  05/04/22 232 lb (105.2 kg)    Physical Exam Vitals and nursing note reviewed. Exam conducted with a chaperone present.  Cardiovascular:     Rate and Rhythm: Normal rate and regular rhythm.     Heart sounds: Normal heart sounds. No murmur heard. Pulmonary:     Breath sounds: Normal breath sounds. No wheezing or rales.  Abdominal:     General: Bowel sounds are normal.     Palpations: Abdomen is soft. There is no mass.     Tenderness: There is no abdominal tenderness. There is no guarding.  Musculoskeletal:        General: No swelling or tenderness.  Skin:    General: Skin is warm and dry.  Neurological:     Mental Status: He is alert and oriented to person, place, and time. Mental status is at baseline.  Psychiatric:        Mood and Affect: Mood normal.        Behavior: Behavior normal.        Judgment: Judgment normal.     Labs reviewed: Basic Metabolic Panel: Recent Labs    01/11/22 0811 04/05/22 0827 07/05/22 0805  NA 138 140 137  K 4.6 4.5 4.6  CL 104 104 100  CO2 24 25 27  $ GLUCOSE 156* 119* 152*  BUN 16 20 20  $ CREATININE 1.08 1.00 1.16  CALCIUM 9.5 9.6 10.2   Liver Function Tests: Recent Labs    01/11/22 0811 04/05/22 0827 07/05/22 0805  AST 54* 45* 35  ALT 63* 52* 36  BILITOT 0.7 0.9 0.9  PROT 7.0 7.0 7.1   No results for input(s): "LIPASE", "AMYLASE" in the last 8760 hours. No results for input(s): "AMMONIA" in the last 8760  hours. CBC: Recent Labs    10/10/21 0801 04/05/22 0827  WBC 6.2 7.0  NEUTROABS 3,739 4,221  HGB 15.5 14.5  HCT 46.7 43.2  MCV 90.0 87.3  PLT 236 258   Lipid Panel: Recent Labs    10/10/21 0801 04/05/22 0827  CHOL 138 115  HDL 33* 30*  LDLCALC 77 60  TRIG 188* 172*  CHOLHDL 4.2 3.8   TSH: No results for input(s): "TSH" in the last 8760 hours. A1C: Lab Results  Component Value Date   HGBA1C 7.7 (H) 07/05/2022     Assessment/Plan 1. Diabetes mellitus type 2 in obese (HCC) Pt hasn't been able to get the trulicity. He would like to try mounjaro which is what his wife is using. Encouraged patient to continue active lifestyle with weight loss and eating healthy.  - tirzepatide [facility]) 2.5 MG/0.5ML Pen; Inject 2.5 mg into the skin once a week.  Dispense: 2 mL; Refill: 0  2. Class 1 drug-induced obesity with serious comorbidity and body mass index (BMI) of 34.0 to 34.9 in adult Patient has lost 6 lbs. Continue diet and lifestyle modifications.   3. Allergic rhinitis, unspecified seasonality, unspecified trigger Patient is going to try xyzal. Educated patient to refrain from blowing his nose so much. Continue flonase and neti rinse. A humidifier may  help.  Return in about 3 months (around 10/10/2022) for routine follow up, labs prior.  Student- Archer Asa O'Berry ACPCNP-S  I personally was present during the history, physical exam and medical decision-making activities of this service and have verified that the service and findings are accurately documented in the student's note Fenris Cauble K. Harle Battiest  [facility] [phone]

## 2022-07-16 ENCOUNTER — Encounter: Payer: Self-pay | Admitting: Nurse Practitioner

## 2022-07-16 NOTE — Telephone Encounter (Signed)
Message sent to Lauree Chandler, NP to confirm patient is a candidate for the RSV vaccine

## 2022-07-22 LAB — HM DIABETES EYE EXAM

## 2022-10-04 ENCOUNTER — Other Ambulatory Visit: Payer: 59

## 2022-10-04 DIAGNOSIS — E1169 Type 2 diabetes mellitus with other specified complication: Secondary | ICD-10-CM

## 2022-10-05 LAB — COMPLETE METABOLIC PANEL WITH GFR
AG Ratio: 1.9 (calc) (ref 1.0–2.5)
ALT: 35 U/L (ref 9–46)
AST: 34 U/L (ref 10–35)
Albumin: 4.5 g/dL (ref 3.6–5.1)
Alkaline phosphatase (APISO): 61 U/L (ref 35–144)
BUN: 21 mg/dL (ref 7–25)
CO2: 25 mmol/L (ref 20–32)
Calcium: 9.8 mg/dL (ref 8.6–10.3)
Chloride: 103 mmol/L (ref 98–110)
Creat: 1.15 mg/dL (ref 0.70–1.35)
Globulin: 2.4 g/dL (calc) (ref 1.9–3.7)
Glucose, Bld: 106 mg/dL — ABNORMAL HIGH (ref 65–99)
Potassium: 4.8 mmol/L (ref 3.5–5.3)
Sodium: 140 mmol/L (ref 135–146)
Total Bilirubin: 0.6 mg/dL (ref 0.2–1.2)
Total Protein: 6.9 g/dL (ref 6.1–8.1)
eGFR: 72 mL/min/{1.73_m2} (ref >=60)

## 2022-10-05 LAB — HEMOGLOBIN A1C
Hgb A1c MFr Bld: 6.6 % of total Hgb — ABNORMAL HIGH (ref <5.7)
Mean Plasma Glucose: 143 mg/dL
eAG (mmol/L): 7.9 mmol/L

## 2022-10-10 ENCOUNTER — Ambulatory Visit: Payer: 59 | Admitting: Nurse Practitioner

## 2022-10-11 ENCOUNTER — Encounter: Payer: Self-pay | Admitting: Nurse Practitioner

## 2022-10-11 ENCOUNTER — Other Ambulatory Visit: Payer: Self-pay | Admitting: Nurse Practitioner

## 2022-10-11 ENCOUNTER — Ambulatory Visit: Payer: 59 | Admitting: Nurse Practitioner

## 2022-10-11 VITALS — BP 130/78 | HR 74 | Temp 97.9°F | Ht 68.0 in | Wt 220.0 lb

## 2022-10-11 DIAGNOSIS — E1169 Type 2 diabetes mellitus with other specified complication: Secondary | ICD-10-CM

## 2022-10-11 DIAGNOSIS — E669 Obesity, unspecified: Secondary | ICD-10-CM

## 2022-10-11 DIAGNOSIS — I10 Essential (primary) hypertension: Secondary | ICD-10-CM

## 2022-10-11 DIAGNOSIS — E785 Hyperlipidemia, unspecified: Secondary | ICD-10-CM

## 2022-10-11 DIAGNOSIS — R748 Abnormal levels of other serum enzymes: Secondary | ICD-10-CM | POA: Diagnosis not present

## 2022-10-11 NOTE — Progress Notes (Signed)
Careteam: Patient Care Team: Kyle Seller, NP as PCP - General (Nurse Practitioner) Kyle Mcclure, OD as Consulting Physician (Optometry) Kyle Gully, MD (Inactive) as Consulting Physician (Urology)  PLACE OF SERVICE:  [facility]  Advanced Directive information Does Patient Have a Medical Advance Directive?: Yes, Type of Advance Directive: Healthcare Power of Kyle Mcclure;Living will, Does patient want to make changes to medical advance directive?: No - Patient declined  Allergies  Allergen Reactions   Hydrocodone Nausea And Vomiting   Oxycodone Nausea And Vomiting    Chief Complaint  Patient presents with   Medical Management of Chronic Issues    3 month follow-up and discuss recent labs. Discuss mounjaro, out of stock (patient has 3 pens left)      HPI: Patient is a 62 y.o. male for routine follow up on diabetes.  Reports he has had a hard time getting [facility] and reports he feels like he is going to have a hard time getting.  Reports if he does not eat well he feels bad. Learning the correct food to eat.  Has to adjust his eating on injection nights.  He is staying more active.   Review of Systems:  Review of Systems  Constitutional:  Negative for chills, fever and weight loss.  HENT:  Negative for tinnitus.   Respiratory:  Negative for cough, sputum production and shortness of breath.   Cardiovascular:  Negative for chest pain, palpitations and leg swelling.  Gastrointestinal:  Negative for abdominal pain, constipation, diarrhea and heartburn.  Genitourinary:  Negative for dysuria, frequency and urgency.  Musculoskeletal:  Negative for back pain, falls, joint pain and myalgias.  Skin: Negative.   Neurological:  Negative for dizziness and headaches.  Psychiatric/Behavioral:  Negative for depression and memory loss. The patient does not have insomnia.     Past Medical History:  Diagnosis Date   Fatty liver    followed by dr [name]--- mild  elevated LFT since 2015,  blood work-up done 05/ 2020 (epic), and abd. ultrasound 10-16-2018 (epic),  recommendation loss weight   History of kidney stones    Hyperlipidemia    Hypertension    Seasonal allergies    Type 2 diabetes mellitus (HCC)    followed by pcp--  fasting cbg-- 140;  had been checking blood surgar past few weeks   Ureteral calculi    right   Wears contact lenses    Past Surgical History:  Procedure Laterality Date   COLONOSCOPY WITH PROPOFOL  03-13-2015  dr [name]   CYSTOSCOPY WITH HOLMIUM LASER LITHOTRIPSY  12-23-2003   dr Vernie Ammons  @[facility]    CYSTOSCOPY/RETROGRADE/URETEROSCOPY/STONE EXTRACTION WITH BASKET Right 03/26/2019   Procedure: CYSTOSCOPY/RETROGRADE/URETEROSCOPY/STONE EXTRACTION WITH BASKET/ STENT PLACEMENT;  Surgeon: Kyle Gully, MD;  Location: [facility];  Service: Urology;  Laterality: Right;   EXTRACORPOREAL SHOCK WAVE LITHOTRIPSY  11-05-2012   @[facility]    TONSILLECTOMY  child   Social History:   reports that he quit smoking about 32 years ago. His smoking use included cigarettes. He has a 1.50 pack-year smoking history. He has never used smokeless tobacco. He reports current alcohol use of about 4.0 standard drinks of alcohol per week. He reports that he does not use drugs.  Family History  Problem Relation Age of Onset   Cancer Mother    Cataracts Father    Colon cancer Neg Hx    Rectal cancer Neg Hx    Stomach cancer Neg Hx    Esophageal cancer Neg Hx  Medications: Patient's Medications  New Prescriptions   No medications on file  Previous Medications   ASPIRIN 81 MG TABLET    Take 81 mg by mouth daily.   ATORVASTATIN (LIPITOR) 20 MG TABLET    Take 1 tablet (20 mg total) by mouth daily.   CETIRIZINE (ZYRTEC) 10 MG TABLET    Take 10 mg by mouth daily.   EMPAGLIFLOZIN (JARDIANCE) 25 MG TABS TABLET    Take 1 tablet (25 mg total) by mouth daily.   FENOFIBRATE (TRICOR) 145 MG TABLET    Take 1 tablet (145 mg total) by mouth daily.  (E78.5)   FLUTICASONE (FLONASE) 50 MCG/ACT NASAL SPRAY    Place 1 spray into both nostrils as needed for allergies or rhinitis.   GLUCOSE BLOOD (ONETOUCH VERIO) TEST STRIP    Use to test blood sugar twice daily Dx: E11.69   IBUPROFEN (ADVIL) 200 MG TABLET    Take 200 mg by mouth every 6 (six) hours as needed.   LANCET DEVICES (ONE TOUCH DELICA LANCING DEV) MISC    Check blood sugar 2 x daily as directed DX: 250.02   LISINOPRIL (ZESTRIL) 10 MG TABLET    Take 1 tablet (10 mg total) by mouth daily.   METFORMIN (GLUCOPHAGE) 1000 MG TABLET    Take 1 tablet (1,000 mg total) by mouth 2 (two) times daily with a meal.   NAPROXEN SODIUM (ALEVE) 220 MG TABLET    Take 220 mg by mouth as needed.   PIOGLITAZONE (ACTOS) 15 MG TABLET    Take 1 tablet (15 mg total) by mouth daily.   POTASSIUM CITRATE (UROCIT-K) 10 MEQ (1080 MG) SR TABLET    at bedtime. 2 by mouth once daily to prevent kidney stones   TIRZEPATIDE (MOUNJARO) 5 MG/0.5ML PEN    Inject 5 mg into the skin once a week.  Modified Medications   No medications on file  Discontinued Medications   ALBUTEROL (VENTOLIN HFA) 108 (90 BASE) MCG/ACT INHALER    Inhale 1-2 puffs into the lungs every 6 (six) hours as needed for wheezing or shortness of breath.   AMOXICILLIN-CLAVULANATE (AUGMENTIN) 875-125 MG TABLET    Take 1 tablet by mouth every 12 (twelve) hours.   TIRZEPATIDE (MOUNJARO) 2.5 MG/0.5ML PEN    Inject 2.5 mg into the skin once a week.    Physical Exam:  Vitals:   10/11/22 0750  BP: 130/78  Pulse: 74  Temp: 97.9 F (36.6 C)  TempSrc: Temporal  SpO2: 95%  Weight: 220 lb (99.8 kg)  Height: 5\' 8"  (1.727 m)   Body mass index is 33.45 kg/m. Wt Readings from Last 3 Encounters:  10/11/22 220 lb (99.8 kg)  07/12/22 226 lb 6.4 oz (102.7 kg)  05/09/22 232 lb (105.2 kg)    Physical Exam Constitutional:      General: He is not in acute distress.    Appearance: He is well-developed. He is not diaphoretic.  HENT:     Head: Normocephalic and  atraumatic.     Right Ear: External ear normal.     Left Ear: External ear normal.     Mouth/Throat:     Pharynx: No oropharyngeal exudate.  Eyes:     Conjunctiva/sclera: Conjunctivae normal.     Pupils: Pupils are equal, round, and reactive to light.  Cardiovascular:     Rate and Rhythm: Normal rate and regular rhythm.     Heart sounds: Normal heart sounds.  Pulmonary:     Effort: Pulmonary effort is  normal.     Breath sounds: Normal breath sounds.  Abdominal:     General: Bowel sounds are normal.     Palpations: Abdomen is soft.  Musculoskeletal:        General: No tenderness.     Cervical back: Normal range of motion and neck supple.     Right lower leg: No edema.     Left lower leg: No edema.  Skin:    General: Skin is warm and dry.  Neurological:     Mental Status: He is alert and oriented to person, place, and time.     Labs reviewed: Basic Metabolic Panel: Recent Labs    04/05/22 0827 07/05/22 0805 10/04/22 0811  NA 140 137 140  K 4.5 4.6 4.8  CL 104 100 103  CO2 25 27 25   GLUCOSE 119* 152* 106*  BUN 20 20 21   CREATININE 1.00 1.16 1.15  CALCIUM 9.6 10.2 9.8   Liver Function Tests: Recent Labs    04/05/22 0827 07/05/22 0805 10/04/22 0811  AST 45* 35 34  ALT 52* 36 35  BILITOT 0.9 0.9 0.6  PROT 7.0 7.1 6.9   No results for input(s): "LIPASE", "AMYLASE" in the last 8760 hours. No results for input(s): "AMMONIA" in the last 8760 hours. CBC: Recent Labs    04/05/22 0827  WBC 7.0  NEUTROABS 4,221  HGB 14.5  HCT 43.2  MCV 87.3  PLT 258   Lipid Panel: Recent Labs    04/05/22 0827  CHOL 115  HDL 30*  LDLCALC 60  TRIG 161*  CHOLHDL 3.8   TSH: No results for input(s): "TSH" in the last 8760 hours. A1C: Lab Results  Component Value Date   HGBA1C 6.6 (H) 10/04/2022     Assessment/Plan 1. Hyperlipidemia LDL goal <70 -continue with dietary modifications with fenofibrate and lipitor.  - Lipid panel; Future - COMPLETE METABOLIC PANEL  WITH GFR; Future  2. Essential hypertension -Blood pressure well controlled, goal bp <140/90 Continue current medications and dietary modifications follow metabolic panel - COMPLETE METABOLIC PANEL WITH GFR; Future - CBC with Differential/Platelet; Future  3. Elevated liver enzymes Improved on recent labs, continues on low fat diet.  - COMPLETE METABOLIC PANEL WITH GFR; Future  4. Type 2 diabetes mellitus with obesity (HCC) -A1c at goal!! Will continue current medications  -Encouraged dietary compliance, routine foot care/monitoring and to keep up with diabetic eye exams through ophthalmology  - Hemoglobin A1c; Future - Microalbumin/Creatinine Ratio, Urine; Future   Return in about 6 months (around 04/13/2023) for routine follow up, labs prior to visit . Janene Harvey. Biag[facility] [phone]

## 2022-10-14 ENCOUNTER — Encounter: Payer: Self-pay | Admitting: Nurse Practitioner

## 2022-10-14 MED ORDER — TIRZEPATIDE 5 MG/0.5ML ~~LOC~~ SOAJ: 5.0000 mg | SUBCUTANEOUS | 1 refills | Status: DC

## 2022-10-15 ENCOUNTER — Other Ambulatory Visit (HOSPITAL_COMMUNITY): Payer: Self-pay

## 2022-10-15 ENCOUNTER — Other Ambulatory Visit: Payer: Self-pay

## 2022-10-15 MED ORDER — TIRZEPATIDE 5 MG/0.5ML ~~LOC~~ SOAJ
5.0000 mg | SUBCUTANEOUS | 1 refills | Status: DC
  Filled 2022-10-15: qty 2, 28d supply, fill #0

## 2022-10-17 ENCOUNTER — Other Ambulatory Visit (HOSPITAL_COMMUNITY): Payer: Self-pay

## 2022-11-11 ENCOUNTER — Telehealth: Payer: Self-pay

## 2022-11-11 ENCOUNTER — Other Ambulatory Visit: Payer: Self-pay | Admitting: Nurse Practitioner

## 2022-11-11 DIAGNOSIS — E785 Hyperlipidemia, unspecified: Secondary | ICD-10-CM

## 2022-11-11 MED ORDER — TIRZEPATIDE 5 MG/0.5ML ~~LOC~~ SOAJ: 5.0000 mg | SUBCUTANEOUS | 1 refills | Status: DC

## 2022-11-11 NOTE — Telephone Encounter (Signed)
[facility]

## 2022-11-22 ENCOUNTER — Other Ambulatory Visit: Payer: Self-pay | Admitting: Nurse Practitioner

## 2022-11-22 DIAGNOSIS — I10 Essential (primary) hypertension: Secondary | ICD-10-CM

## 2022-11-25 ENCOUNTER — Other Ambulatory Visit: Payer: Self-pay | Admitting: Nurse Practitioner

## 2022-11-25 DIAGNOSIS — E669 Obesity, unspecified: Secondary | ICD-10-CM

## 2023-01-06 ENCOUNTER — Encounter: Payer: Self-pay | Admitting: Nurse Practitioner

## 2023-01-06 DIAGNOSIS — R748 Abnormal levels of other serum enzymes: Secondary | ICD-10-CM

## 2023-01-06 DIAGNOSIS — E785 Hyperlipidemia, unspecified: Secondary | ICD-10-CM

## 2023-01-06 MED ORDER — ATORVASTATIN CALCIUM 20 MG PO TABS
20.0000 mg | ORAL_TABLET | Freq: Every day | ORAL | 0 refills | Status: DC
Start: 2023-01-06 — End: 2023-03-19

## 2023-02-18 ENCOUNTER — Other Ambulatory Visit: Payer: Self-pay | Admitting: Nurse Practitioner

## 2023-02-18 DIAGNOSIS — E1169 Type 2 diabetes mellitus with other specified complication: Secondary | ICD-10-CM

## 2023-02-26 ENCOUNTER — Encounter: Payer: Self-pay | Admitting: Nurse Practitioner

## 2023-03-19 ENCOUNTER — Other Ambulatory Visit: Payer: Self-pay | Admitting: Nurse Practitioner

## 2023-03-19 DIAGNOSIS — R748 Abnormal levels of other serum enzymes: Secondary | ICD-10-CM

## 2023-03-19 DIAGNOSIS — E785 Hyperlipidemia, unspecified: Secondary | ICD-10-CM

## 2023-04-14 ENCOUNTER — Other Ambulatory Visit: Payer: 59

## 2023-04-14 DIAGNOSIS — I10 Essential (primary) hypertension: Secondary | ICD-10-CM

## 2023-04-14 DIAGNOSIS — E785 Hyperlipidemia, unspecified: Secondary | ICD-10-CM

## 2023-04-14 DIAGNOSIS — R748 Abnormal levels of other serum enzymes: Secondary | ICD-10-CM

## 2023-04-14 DIAGNOSIS — E1169 Type 2 diabetes mellitus with other specified complication: Secondary | ICD-10-CM

## 2023-04-15 LAB — COMPLETE METABOLIC PANEL WITH GFR
AG Ratio: 1.9 (calc) (ref 1.0–2.5)
ALT: 43 U/L (ref 9–46)
AST: 38 U/L — ABNORMAL HIGH (ref 10–35)
Albumin: 4.6 g/dL (ref 3.6–5.1)
Alkaline phosphatase (APISO): 58 U/L (ref 35–144)
BUN: 20 mg/dL (ref 7–25)
CO2: 27 mmol/L (ref 20–32)
Calcium: 11.1 mg/dL — ABNORMAL HIGH (ref 8.6–10.3)
Chloride: 103 mmol/L (ref 98–110)
Creat: 1 mg/dL (ref 0.70–1.35)
Globulin: 2.4 g/dL (ref 1.9–3.7)
Glucose, Bld: 120 mg/dL — ABNORMAL HIGH (ref 65–99)
Potassium: 4.9 mmol/L (ref 3.5–5.3)
Sodium: 140 mmol/L (ref 135–146)
Total Bilirubin: 0.8 mg/dL (ref 0.2–1.2)
Total Protein: 7 g/dL (ref 6.1–8.1)
eGFR: 86 mL/min/{1.73_m2} (ref >=60)

## 2023-04-15 LAB — LIPID PANEL
Cholesterol: 128 mg/dL (ref <200)
HDL: 40 mg/dL (ref >=40)
LDL Cholesterol (Calc): 65 mg/dL
Non-HDL Cholesterol (Calc): 88 mg/dL (ref <130)
Total CHOL/HDL Ratio: 3.2 (calc) (ref <5.0)
Triglycerides: 151 mg/dL — ABNORMAL HIGH (ref <150)

## 2023-04-15 LAB — CBC WITH DIFFERENTIAL/PLATELET
Absolute Lymphocytes: 1839 {cells}/uL (ref 850–3900)
Absolute Monocytes: 518 {cells}/uL (ref 200–950)
Basophils Absolute: 78 {cells}/uL (ref 0–200)
Basophils Relative: 1.1 %
Eosinophils Absolute: 206 {cells}/uL (ref 15–500)
Eosinophils Relative: 2.9 %
HCT: 46.1 % (ref 38.5–50.0)
Hemoglobin: 15.2 g/dL (ref 13.2–17.1)
MCH: 30 pg (ref 27.0–33.0)
MCHC: 33 g/dL (ref 32.0–36.0)
MCV: 91.1 fL (ref 80.0–100.0)
MPV: 9.4 fL (ref 7.5–12.5)
Monocytes Relative: 7.3 %
Neutro Abs: 4459 {cells}/uL (ref 1500–7800)
Neutrophils Relative %: 62.8 %
Platelets: 262 10*3/uL (ref 140–400)
RBC: 5.06 10*6/uL (ref 4.20–5.80)
RDW: 12.2 % (ref 11.0–15.0)
Total Lymphocyte: 25.9 %
WBC: 7.1 10*3/uL (ref 3.8–10.8)

## 2023-04-15 LAB — MICROALBUMIN / CREATININE URINE RATIO
Creatinine, Urine: 66 mg/dL (ref 20–320)
Microalb Creat Ratio: 12 mg/g{creat} (ref <30)
Microalb, Ur: 0.8 mg/dL

## 2023-04-15 LAB — HEMOGLOBIN A1C
Hgb A1c MFr Bld: 6.5 %{Hb} — ABNORMAL HIGH (ref <5.7)
Mean Plasma Glucose: 140 mg/dL
eAG (mmol/L): 7.7 mmol/L

## 2023-04-16 ENCOUNTER — Ambulatory Visit: Payer: 59 | Admitting: Nurse Practitioner

## 2023-04-16 ENCOUNTER — Encounter: Payer: Self-pay | Admitting: Nurse Practitioner

## 2023-04-16 VITALS — BP 128/78 | HR 80 | Temp 97.6°F | Resp 18 | Ht 68.0 in | Wt 222.6 lb

## 2023-04-16 DIAGNOSIS — E1169 Type 2 diabetes mellitus with other specified complication: Secondary | ICD-10-CM | POA: Diagnosis not present

## 2023-04-16 DIAGNOSIS — R748 Abnormal levels of other serum enzymes: Secondary | ICD-10-CM | POA: Diagnosis not present

## 2023-04-16 DIAGNOSIS — Z23 Encounter for immunization: Secondary | ICD-10-CM

## 2023-04-16 DIAGNOSIS — I10 Essential (primary) hypertension: Secondary | ICD-10-CM | POA: Diagnosis not present

## 2023-04-16 DIAGNOSIS — N400 Enlarged prostate without lower urinary tract symptoms: Secondary | ICD-10-CM

## 2023-04-16 DIAGNOSIS — J309 Allergic rhinitis, unspecified: Secondary | ICD-10-CM

## 2023-04-16 DIAGNOSIS — E785 Hyperlipidemia, unspecified: Secondary | ICD-10-CM

## 2023-04-16 DIAGNOSIS — K219 Gastro-esophageal reflux disease without esophagitis: Secondary | ICD-10-CM

## 2023-04-16 DIAGNOSIS — E66811 Obesity, class 1: Secondary | ICD-10-CM

## 2023-04-16 DIAGNOSIS — E669 Obesity, unspecified: Secondary | ICD-10-CM

## 2023-04-16 MED ORDER — FAMOTIDINE 20 MG PO TABS: 20.0000 mg | ORAL_TABLET | Freq: Two times a day (BID) | ORAL | 1 refills | Status: DC | PRN

## 2023-04-16 MED ORDER — METFORMIN HCL 1000 MG PO TABS
1000.0000 mg | ORAL_TABLET | Freq: Every day | ORAL | 1 refills | Status: DC
Start: 2023-04-16 — End: 2023-10-28

## 2023-04-16 NOTE — Patient Instructions (Signed)
To use pepcid as needed for indigestion.   Try to decrease calcium in diet.

## 2023-04-16 NOTE — Progress Notes (Signed)
Careteam: Patient Care Team: [name], NP as PCP - General (Nurse Practitioner) [name], OD as Consulting Physician (Optometry) [name], MD (Inactive) as Consulting Physician (Urology)  PLACE OF SERVICE:  [facility]  Advanced Directive information    Allergies  Allergen Reactions   Hydrocodone Nausea And Vomiting   Oxycodone Nausea And Vomiting    Chief Complaint  Patient presents with   Follow-up    74month follow-up routine   Immunizations    Influenza. [facility] verified     HPI: Patient is a 62 y.o. male for routine follow up.  Getting a lot of steps at work.   Taking calcium due to GERD- will have more indigestion on mounjaro  Has a lot more energy with the weight loss  No hypoglycemia.   Labs reviewed during visit   Follows with urologist.   Review of Systems:  Review of Systems  Constitutional:  Negative for chills, fever and weight loss.  HENT:  Negative for tinnitus.   Respiratory:  Negative for cough, sputum production and shortness of breath.   Cardiovascular:  Negative for chest pain, palpitations and leg swelling.  Gastrointestinal:  Negative for abdominal pain, constipation, diarrhea and heartburn.  Genitourinary:  Negative for dysuria, frequency and urgency.  Musculoskeletal:  Negative for back pain, falls, joint pain and myalgias.  Skin: Negative.   Neurological:  Negative for dizziness and headaches.  Psychiatric/Behavioral:  Negative for depression and memory loss. The patient does not have insomnia.     Past Medical History:  Diagnosis Date   Fatty liver    followed by dr [name]--- mild elevated LFT since 2015,  blood work-up done 05/ 2020 (epic), and abd. ultrasound 10-16-2018 (epic),  recommendation loss weight   History of kidney stones    Hyperlipidemia    Hypertension    Seasonal allergies    Type 2 diabetes mellitus (HCC)    followed by pcp--  fasting cbg-- 140;  had been checking blood surgar past few weeks    Ureteral calculi    right   Wears contact lenses    Past Surgical History:  Procedure Laterality Date   COLONOSCOPY WITH PROPOFOL  03-13-2015  dr [name]   CYSTOSCOPY WITH HOLMIUM LASER LITHOTRIPSY  12-23-2003   dr Vernie Ammons  @[email or web address]    CYSTOSCOPY/RETROGRADE/URETEROSCOPY/STONE EXTRACTION WITH BASKET Right 03/26/2019   Procedure: CYSTOSCOPY/RETROGRADE/URETEROSCOPY/STONE EXTRACTION WITH BASKET/ STENT PLACEMENT;  Surgeon: [name], MD;  Location: [facility];  Service: Urology;  Laterality: Right;   EXTRACORPOREAL SHOCK WAVE LITHOTRIPSY  11-05-2012   @[facility]    TONSILLECTOMY  child   Social History:   reports that he quit smoking about 32 years ago. His smoking use included cigarettes. He started smoking about 35 years ago. He has a 1.5 pack-year smoking history. He has never used smokeless tobacco. He reports current alcohol use of about 4.0 standard drinks of alcohol per week. He reports that he does not use drugs.  Family History  Problem Relation Age of Onset   Cancer Mother    Cataracts Father    Colon cancer Neg Hx    Rectal cancer Neg Hx    Stomach cancer Neg Hx    Esophageal cancer Neg Hx     Medications: Patient's Medications  New Prescriptions   No medications on file  Previous Medications   ASPIRIN 81 MG TABLET    Take 81 mg by mouth daily.   ATORVASTATIN (LIPITOR) 20 MG TABLET    TAKE 1 TABLET DAILY  CETIRIZINE (ZYRTEC) 10 MG TABLET    Take 10 mg by mouth daily.   EMPAGLIFLOZIN (JARDIANCE) 25 MG TABS TABLET    Take 1 tablet (25 mg total) by mouth daily.   FENOFIBRATE (TRICOR) 145 MG TABLET    TAKE 1 TABLET DAILY   FLUTICASONE (FLONASE) 50 MCG/ACT NASAL SPRAY    Place 1 spray into both nostrils as needed for allergies or rhinitis.   GLUCOSE BLOOD (ONETOUCH VERIO) TEST STRIP    Use to test blood sugar twice daily Dx: E11.69   IBUPROFEN (ADVIL) 200 MG TABLET    Take 200 mg by mouth every 6 (six) hours as needed.   LANCET DEVICES (ONE TOUCH DELICA LANCING DEV)  MISC    Check blood sugar 2 x daily as directed DX: 250.02   LISINOPRIL (ZESTRIL) 10 MG TABLET    TAKE 1 TABLET DAILY   METFORMIN (GLUCOPHAGE) 1000 MG TABLET    TAKE 1 TABLET TWICE A DAY WITH MEALS   NAPROXEN SODIUM (ALEVE) 220 MG TABLET    Take 220 mg by mouth as needed.   PIOGLITAZONE (ACTOS) 15 MG TABLET    TAKE 1 TABLET DAILY   POTASSIUM CITRATE (UROCIT-K) 10 MEQ (1080 MG) SR TABLET    at bedtime. 2 by mouth once daily to prevent kidney stones   TIRZEPATIDE (MOUNJARO) 5 MG/0.5ML PEN    Inject 5 mg into the skin once a week.  Modified Medications   No medications on file  Discontinued Medications   No medications on file    Physical Exam:  Vitals:   04/16/23 0823  BP: 128/78  Pulse: 80  Resp: 18  Temp: 97.6 F (36.4 C)  SpO2: 98%  Weight: 222 lb 9.6 oz (101 kg)  Height: 5\' 8"  (1.727 m)   Body mass index is 33.85 kg/m. Wt Readings from Last 3 Encounters:  04/16/23 222 lb 9.6 oz (101 kg)  10/11/22 220 lb (99.8 kg)  07/12/22 226 lb 6.4 oz (102.7 kg)    Physical Exam Constitutional:      General: He is not in acute distress.    Appearance: He is well-developed. He is not diaphoretic.  HENT:     Head: Normocephalic and atraumatic.     Right Ear: External ear normal.     Left Ear: External ear normal.     Mouth/Throat:     Pharynx: No oropharyngeal exudate.  Eyes:     Conjunctiva/sclera: Conjunctivae normal.     Pupils: Pupils are equal, round, and reactive to light.  Cardiovascular:     Rate and Rhythm: Normal rate and regular rhythm.     Heart sounds: Normal heart sounds.  Pulmonary:     Effort: Pulmonary effort is normal.     Breath sounds: Normal breath sounds.  Abdominal:     General: Bowel sounds are normal.     Palpations: Abdomen is soft.  Musculoskeletal:        General: No tenderness.     Cervical back: Normal range of motion and neck supple.     Right lower leg: No edema.     Left lower leg: No edema.  Skin:    General: Skin is warm and dry.   Neurological:     Mental Status: He is alert and oriented to person, place, and time.     Labs reviewed: Basic Metabolic Panel: Recent Labs    07/05/22 0805 10/04/22 0811 04/14/23 0843  NA 137 140 140  K 4.6 4.8 4.9  CL  100 103 103  CO2 27 25 27   GLUCOSE 152* 106* 120*  BUN 20 21 20   CREATININE 1.16 1.15 1.00  CALCIUM 10.2 9.8 11.1*   Liver Function Tests: Recent Labs    07/05/22 0805 10/04/22 0811 04/14/23 0843  AST 35 34 38*  ALT 36 35 43  BILITOT 0.9 0.6 0.8  PROT 7.1 6.9 7.0   No results for input(s): "LIPASE", "AMYLASE" in the last 8760 hours. No results for input(s): "AMMONIA" in the last 8760 hours. CBC: Recent Labs    04/14/23 0843  WBC 7.1  NEUTROABS 4,459  HGB 15.2  HCT 46.1  MCV 91.1  PLT 262   Lipid Panel: Recent Labs    04/14/23 0843  CHOL 128  HDL 40  LDLCALC 65  TRIG 151*  CHOLHDL 3.2   TSH: No results for input(s): "TSH" in the last 8760 hours. A1C: Lab Results  Component Value Date   HGBA1C 6.5 (H) 04/14/2023     Assessment/Plan 1. Elevated liver enzymes -stable at this time Low fat diet - COMPLETE METABOLIC PANEL WITH GFR; Future  2. Type 2 diabetes mellitus with obesity (HCC) -Encouraged dietary compliance, routine foot care/monitoring and to keep up with diabetic eye exams through ophthalmology  -A1c at goal, will decrease metformin to daily and continue all other medications - metFORMIN (GLUCOPHAGE) 1000 MG tablet; Take 1 tablet (1,000 mg total) by mouth daily with breakfast.  Dispense: 90 tablet; Refill: 1 - Hemoglobin A1c; Future  3. Serum calcium elevated Avoid calcium supplements, tums, and high calcium foods.  - Complete Metabolic Panel with eGFR; Future - PTH, Intact and Calcium; Future  4. Essential hypertension -Blood pressure well controlled, goal bp <140/90 Continue current medications and dietary modifications follow metabolic panel - COMPLETE METABOLIC PANEL WITH GFR; Future - CBC with  Differential/Platelet; Future  5. Benign prostatic hyperplasia without lower urinary tract symptoms -stable, continues to follow up with urology  6. Obesity (BMI 30.0-34.9) -has lost weight  and feeling better, continue diet and exercise modifications.   7. Hyperlipidemia LDL goal <70 -at goal -continues on fenofibrate with lipitor - Lipid panel; Future  8. Allergic rhinitis, unspecified seasonality, unspecified trigger Stable at this time, continues on xyzal.   9. Gastroesophageal reflux disease without esophagitis To avoid tums due to elevated calcium  - famotidine (PEPCID) 20 MG tablet; Take 1 tablet (20 mg total) by mouth 2 (two) times daily as needed for heartburn or indigestion.  Dispense: 60 tablet; Refill: 1  10. Need for influenza vaccination - Flu Vaccine Trivalent High Dose (Fluad)   Return in about 6 months (around 10/14/2023) for CPE, labs prior to visit .:Taelyn Broecker K. Biagio Borg [facility] [phone]

## 2023-04-17 ENCOUNTER — Other Ambulatory Visit: Payer: Self-pay | Admitting: *Deleted

## 2023-04-18 ENCOUNTER — Ambulatory Visit: Payer: 59 | Admitting: Nurse Practitioner

## 2023-05-05 ENCOUNTER — Other Ambulatory Visit: Payer: Self-pay | Admitting: Nurse Practitioner

## 2023-05-05 ENCOUNTER — Other Ambulatory Visit: Payer: 59

## 2023-05-05 DIAGNOSIS — E1169 Type 2 diabetes mellitus with other specified complication: Secondary | ICD-10-CM

## 2023-05-05 DIAGNOSIS — E669 Obesity, unspecified: Secondary | ICD-10-CM

## 2023-05-06 LAB — PTH, INTACT AND CALCIUM
Calcium: 9.4 mg/dL (ref 8.6–10.3)
PTH: 29 pg/mL (ref 16–77)

## 2023-05-26 ENCOUNTER — Other Ambulatory Visit: Payer: Self-pay | Admitting: Nurse Practitioner

## 2023-05-26 DIAGNOSIS — E669 Obesity, unspecified: Secondary | ICD-10-CM

## 2023-07-14 ENCOUNTER — Other Ambulatory Visit: Payer: Self-pay | Admitting: *Deleted

## 2023-07-14 MED ORDER — TIRZEPATIDE 5 MG/0.5ML ~~LOC~~ SOAJ: 5.0000 mg | SUBCUTANEOUS | 1 refills | Status: DC

## 2023-07-14 NOTE — Telephone Encounter (Signed)
 Patient requested refill to be sent to [facility]. Refilled as requested.

## 2023-07-28 ENCOUNTER — Encounter: Payer: Self-pay | Admitting: Nurse Practitioner

## 2023-07-28 LAB — HM DIABETES EYE EXAM

## 2023-10-16 ENCOUNTER — Other Ambulatory Visit: Payer: Self-pay

## 2023-10-16 ENCOUNTER — Other Ambulatory Visit: Payer: 59

## 2023-10-16 DIAGNOSIS — E785 Hyperlipidemia, unspecified: Secondary | ICD-10-CM

## 2023-10-16 DIAGNOSIS — E1169 Type 2 diabetes mellitus with other specified complication: Secondary | ICD-10-CM

## 2023-10-16 DIAGNOSIS — E119 Type 2 diabetes mellitus without complications: Secondary | ICD-10-CM

## 2023-10-16 DIAGNOSIS — I10 Essential (primary) hypertension: Secondary | ICD-10-CM

## 2023-10-16 DIAGNOSIS — R748 Abnormal levels of other serum enzymes: Secondary | ICD-10-CM

## 2023-10-16 DIAGNOSIS — N400 Enlarged prostate without lower urinary tract symptoms: Secondary | ICD-10-CM

## 2023-10-17 ENCOUNTER — Encounter: Payer: Self-pay | Admitting: Nurse Practitioner

## 2023-10-17 DIAGNOSIS — Z23 Encounter for immunization: Secondary | ICD-10-CM | POA: Insufficient documentation

## 2023-10-17 LAB — CBC WITH DIFFERENTIAL/PLATELET
Absolute Lymphocytes: 1879 {cells}/uL (ref 850–3900)
Absolute Monocytes: 533 {cells}/uL (ref 200–950)
Basophils Absolute: 94 {cells}/uL (ref 0–200)
Basophils Relative: 1.3 %
Eosinophils Absolute: 158 {cells}/uL (ref 15–500)
Eosinophils Relative: 2.2 %
HCT: 47.7 % (ref 38.5–50.0)
Hemoglobin: 15.9 g/dL (ref 13.2–17.1)
MCH: 30.5 pg (ref 27.0–33.0)
MCHC: 33.3 g/dL (ref 32.0–36.0)
MCV: 91.6 fL (ref 80.0–100.0)
MPV: 9 fL (ref 7.5–12.5)
Monocytes Relative: 7.4 %
Neutro Abs: 4536 {cells}/uL (ref 1500–7800)
Neutrophils Relative %: 63 %
Platelets: 267 10*3/uL (ref 140–400)
RBC: 5.21 10*6/uL (ref 4.20–5.80)
RDW: 12.3 % (ref 11.0–15.0)
Total Lymphocyte: 26.1 %
WBC: 7.2 10*3/uL (ref 3.8–10.8)

## 2023-10-17 LAB — LIPID PANEL
Cholesterol: 139 mg/dL (ref <200)
HDL: 39 mg/dL — ABNORMAL LOW (ref >=40)
LDL Cholesterol (Calc): 74 mg/dL
Non-HDL Cholesterol (Calc): 100 mg/dL (ref <130)
Total CHOL/HDL Ratio: 3.6 (calc) (ref <5.0)
Triglycerides: 164 mg/dL — ABNORMAL HIGH (ref <150)

## 2023-10-17 LAB — COMPLETE METABOLIC PANEL WITHOUT GFR
AG Ratio: 1.9 (calc) (ref 1.0–2.5)
ALT: 68 U/L — ABNORMAL HIGH (ref 9–46)
AST: 51 U/L — ABNORMAL HIGH (ref 10–35)
Albumin: 4.7 g/dL (ref 3.6–5.1)
Alkaline phosphatase (APISO): 74 U/L (ref 35–144)
BUN: 22 mg/dL (ref 7–25)
CO2: 26 mmol/L (ref 20–32)
Calcium: 10.2 mg/dL (ref 8.6–10.3)
Chloride: 103 mmol/L (ref 98–110)
Creat: 1.1 mg/dL (ref 0.70–1.35)
Globulin: 2.5 g/dL (ref 1.9–3.7)
Glucose, Bld: 136 mg/dL — ABNORMAL HIGH (ref 65–99)
Potassium: 4.8 mmol/L (ref 3.5–5.3)
Sodium: 138 mmol/L (ref 135–146)
Total Bilirubin: 0.7 mg/dL (ref 0.2–1.2)
Total Protein: 7.2 g/dL (ref 6.1–8.1)

## 2023-10-17 LAB — HEMOGLOBIN A1C
Hgb A1c MFr Bld: 7.5 % — ABNORMAL HIGH (ref <5.7)
Mean Plasma Glucose: 169 mg/dL
eAG (mmol/L): 9.3 mmol/L

## 2023-10-17 LAB — PSA: PSA: 0.25 ng/mL (ref <=4.00)

## 2023-10-18 ENCOUNTER — Ambulatory Visit: Payer: Self-pay | Admitting: Nurse Practitioner

## 2023-10-20 ENCOUNTER — Encounter: Payer: Self-pay | Admitting: Nurse Practitioner

## 2023-10-20 ENCOUNTER — Ambulatory Visit: Payer: 59 | Admitting: Nurse Practitioner

## 2023-10-20 VITALS — BP 118/80 | HR 74 | Temp 97.8°F | Ht 68.0 in | Wt 228.0 lb

## 2023-10-20 DIAGNOSIS — E1169 Type 2 diabetes mellitus with other specified complication: Secondary | ICD-10-CM

## 2023-10-20 DIAGNOSIS — M159 Polyosteoarthritis, unspecified: Secondary | ICD-10-CM

## 2023-10-20 DIAGNOSIS — E785 Hyperlipidemia, unspecified: Secondary | ICD-10-CM | POA: Diagnosis not present

## 2023-10-20 DIAGNOSIS — K219 Gastro-esophageal reflux disease without esophagitis: Secondary | ICD-10-CM

## 2023-10-20 DIAGNOSIS — I10 Essential (primary) hypertension: Secondary | ICD-10-CM

## 2023-10-20 DIAGNOSIS — Z23 Encounter for immunization: Secondary | ICD-10-CM

## 2023-10-20 DIAGNOSIS — E669 Obesity, unspecified: Secondary | ICD-10-CM

## 2023-10-20 DIAGNOSIS — N400 Enlarged prostate without lower urinary tract symptoms: Secondary | ICD-10-CM

## 2023-10-20 DIAGNOSIS — E119 Type 2 diabetes mellitus without complications: Secondary | ICD-10-CM

## 2023-10-20 DIAGNOSIS — R748 Abnormal levels of other serum enzymes: Secondary | ICD-10-CM

## 2023-10-20 MED ORDER — TIRZEPATIDE 7.5 MG/0.5ML ~~LOC~~ SOAJ: 7.5000 mg | SUBCUTANEOUS | Status: DC

## 2023-10-20 MED ORDER — ONDANSETRON HCL 4 MG PO TABS: 4.0000 mg | ORAL_TABLET | Freq: Three times a day (TID) | ORAL | 0 refills | Status: AC | PRN

## 2023-10-20 NOTE — Assessment & Plan Note (Signed)
 Controlled on pepcid  PRN with dietary modifications

## 2023-10-20 NOTE — Assessment & Plan Note (Signed)
 Symptoms stable, followed by urology

## 2023-10-20 NOTE — Progress Notes (Signed)
 Careteam: Patient Care Team: Verma Gobble, NP as PCP - General (Nurse Practitioner) Peri Brackett, OD as Consulting Physician (Optometry) Ottelin, Mark, MD (Inactive) as Consulting Physician (Urology)  PLACE OF SERVICE:  [facility] CL[facility]  Advanced Directive information    Allergies  Allergen Reactions   Hydrocodone Nausea And Vomiting   Oxycodone Nausea And Vomiting    Chief Complaint  Patient presents with   Medical Management of Chronic Issues    6 month follow up and foot exam. Discuss pneumo vaccine with provider, patient does not believe he needs at this time. Patient would like to discuss motion sickness.     HPI:  Discussed the use of AI scribe software for clinical note transcription with the patient, who gave verbal consent to proceed.  History of Present Illness Kyle Mcclure is a 63 year old male with diabetes who presents for a six-month follow-up and lab review.  He has stopped losing weight on Mounjaro  and has discontinued metformin , which he believes has resulted in increased energy levels. He is currently taking Mounjaro  and has requested to increase the dose to 7.5 mg. He confirms that he is taking metformin  once daily as previously discussed.  He experiences occasional indigestion and acid reflux, for which he takes a prescribed medication that he prefers over Pepcid  due to its taste. No shortness of breath, cough, or congestion, attributing any nasal symptoms to allergies. He also denies constipation.  He mentions occasional use of Aleve for arthritis pain, particularly when he anticipates outdoor work.  He acknowledges recent dietary indiscretions, including consuming cookies and ice cream cake, and is aware of the impact of sweets on his blood sugar levels.  No significant changes in urination patterns, although he notes increased frequency at night when he consumes more water during the day.  He has a history of elevated liver enzymes and fatty  liver    Review of Systems:  Review of Systems  Constitutional:  Negative for chills, fever and weight loss.  HENT:  Negative for tinnitus.   Respiratory:  Negative for cough, sputum production and shortness of breath.   Cardiovascular:  Negative for chest pain, palpitations and leg swelling.  Gastrointestinal:  Negative for abdominal pain, constipation, diarrhea and heartburn.  Genitourinary:  Negative for dysuria, frequency and urgency.  Musculoskeletal:  Negative for back pain, falls, joint pain and myalgias.  Skin: Negative.   Neurological:  Negative for dizziness and headaches.  Psychiatric/Behavioral:  Negative for depression and memory loss. The patient does not have insomnia.     Past Medical History:  Diagnosis Date   Fatty liver    followed by dr Howard Macho--- mild elevated LFT since 2015,  blood work-up done 05/ 2020 (epic), and abd. ultrasound 10-16-2018 (epic),  recommendation loss weight   History of kidney stones    Hyperlipidemia    Hypertension    Seasonal allergies    Type 2 diabetes mellitus (HCC)    followed by pcp--  fasting cbg-- 140;  had been checking blood surgar past few weeks   Ureteral calculi    right   Wears contact lenses    Past Surgical History:  Procedure Laterality Date   COLONOSCOPY WITH PROPOFOL   03-13-2015  dr Howard Macho   CYSTOSCOPY WITH HOLMIUM LASER LITHOTRIPSY  12-23-2003   dr ottelin  @[email or web address]    CYSTOSCOPY/RETROGRADE/URETEROSCOPY/STONE EXTRACTION WITH BASKET Right 03/26/2019   Procedure: CYSTOSCOPY/RETROGRADE/URETEROSCOPY/STONE EXTRACTION WITH BASKET/ STENT PLACEMENT;  Surgeon: Ottelin, Mark, MD;  Location: [facility];  Service: Urology;  Laterality: Right;   EXTRACORPOREAL SHOCK WAVE LITHOTRIPSY  11-05-2012   @[facility]    TONSILLECTOMY  child   Social History:   reports that he quit smoking about 33 years ago. His smoking use included cigarettes. He started smoking about 36 years ago. He has a 1.5 pack-year smoking history. He has  never used smokeless tobacco. He reports current alcohol use of about 4.0 standard drinks of alcohol per week. He reports that he does not use drugs.  Family History  Problem Relation Age of Onset   Cancer Mother    Cataracts Father    Colon cancer Neg Hx    Rectal cancer Neg Hx    Stomach cancer Neg Hx    Esophageal cancer Neg Hx     Medications: Patient's Medications  New Prescriptions   ONDANSETRON  (ZOFRAN ) 4 MG TABLET    Take 1 tablet (4 mg total) by mouth every 8 (eight) hours as needed for nausea or vomiting.   TIRZEPATIDE  (MOUNJARO ) 7.5 MG/0.5ML PEN    Inject 7.5 mg into the skin once a week.  Previous Medications   ASPIRIN 81 MG TABLET    Take 81 mg by mouth daily.   ATORVASTATIN  (LIPITOR) 20 MG TABLET    TAKE 1 TABLET DAILY   FAMOTIDINE  (PEPCID ) 20 MG TABLET    Take 1 tablet (20 mg total) by mouth 2 (two) times daily as needed for heartburn or indigestion.   FENOFIBRATE  (TRICOR ) 145 MG TABLET    TAKE 1 TABLET DAILY   FLUTICASONE  (FLONASE ) 50 MCG/ACT NASAL SPRAY    Place 1 spray into both nostrils as needed for allergies or rhinitis.   GLUCOSE BLOOD (ONETOUCH VERIO) TEST STRIP    Use to test blood sugar twice daily Dx: E11.69   IBUPROFEN  (ADVIL ) 200 MG TABLET    Take 200 mg by mouth every 6 (six) hours as needed.   JARDIANCE  25 MG TABS TABLET    TAKE 1 TABLET DAILY   LANCET DEVICES (ONE TOUCH DELICA LANCING DEV) MISC    Check blood sugar 2 x daily as directed DX: 250.02   LISINOPRIL  (ZESTRIL ) 10 MG TABLET    TAKE 1 TABLET DAILY   METFORMIN  (GLUCOPHAGE ) 1000 MG TABLET    Take 1 tablet (1,000 mg total) by mouth daily with breakfast.   NAPROXEN SODIUM (ALEVE) 220 MG TABLET    Take 220 mg by mouth as needed.   PIOGLITAZONE  (ACTOS ) 15 MG TABLET    TAKE 1 TABLET DAILY   POTASSIUM CITRATE  (UROCIT-K ) 10 MEQ (1080 MG) SR TABLET    at bedtime. 2 by mouth once daily to prevent kidney stones  Modified Medications   No medications on file  Discontinued Medications   TIRZEPATIDE   (MOUNJARO ) 5 MG/0.5ML PEN    Inject 5 mg into the skin once a week.    Physical Exam:  Vitals:   10/17/23 1301  BP: 118/80  Pulse: 74  Temp: 97.8 F (36.6 C)  SpO2: 98%  Weight: 228 lb (103.4 kg)  Height: 5\' 8"  (1.727 m)   Body mass index is 34.67 kg/m. Wt Readings from Last 3 Encounters:  10/17/23 228 lb (103.4 kg)  04/16/23 222 lb 9.6 oz (101 kg)  10/11/22 220 lb (99.8 kg)    Physical Exam Constitutional:      General: He is not in acute distress.    Appearance: He is well-developed. He is not diaphoretic.  HENT:     Head: Normocephalic and atraumatic.  Right Ear: External ear normal.     Left Ear: External ear normal.     Mouth/Throat:     Pharynx: No oropharyngeal exudate.  Eyes:     Conjunctiva/sclera: Conjunctivae normal.     Pupils: Pupils are equal, round, and reactive to light.  Cardiovascular:     Rate and Rhythm: Normal rate and regular rhythm.     Heart sounds: Normal heart sounds.  Pulmonary:     Effort: Pulmonary effort is normal.     Breath sounds: Normal breath sounds.  Abdominal:     General: Bowel sounds are normal.     Palpations: Abdomen is soft.  Musculoskeletal:        General: No tenderness.     Cervical back: Normal range of motion and neck supple.     Right lower leg: No edema.     Left lower leg: No edema.  Skin:    General: Skin is warm and dry.  Neurological:     Mental Status: He is alert and oriented to person, place, and time.     Labs reviewed: Basic Metabolic Panel: Recent Labs    04/14/23 0843 05/05/23 0856 10/16/23 0816  NA 140  --  138  K 4.9  --  4.8  CL 103  --  103  CO2 27  --  26  GLUCOSE 120*  --  136*  BUN 20  --  22  CREATININE 1.00  --  1.10  CALCIUM  11.1* 9.4 10.2   Liver Function Tests: Recent Labs    04/14/23 0843 10/16/23 0816  AST 38* 51*  ALT 43 68*  BILITOT 0.8 0.7  PROT 7.0 7.2   No results for input(s): "LIPASE", "AMYLASE" in the last 8760 hours. No results for input(s):  "AMMONIA" in the last 8760 hours. CBC: Recent Labs    04/14/23 0843 10/16/23 0816  WBC 7.1 7.2  NEUTROABS 4,459 4,536  HGB 15.2 15.9  HCT 46.1 47.7  MCV 91.1 91.6  PLT 262 267   Lipid Panel: Recent Labs    04/14/23 0843 10/16/23 0816  CHOL 128 139  HDL 40 39*  LDLCALC 65 74  TRIG 151* 164*  CHOLHDL 3.2 3.6   TSH: No results for input(s): "TSH" in the last 8760 hours. A1C: Lab Results  Component Value Date   HGBA1C 7.5 (H) 10/16/2023     Assessment/Plan  Need for pneumococcal 20-valent conjugate vaccination -     Pneumococcal conjugate vaccine 20-valent  Type 2 diabetes mellitus with obesity (HCC) -     Tirzepatide ; Inject 7.5 mg into the skin once a week. -     COMPLETE METABOLIC PANEL WITHOUT GFR; Future -     Hemoglobin A1c; Future -     Pneumococcal conjugate vaccine 20-valent  Hyperlipidemia LDL goal <70 Assessment & Plan: Continues on lipitor with dietary modifications  Orders: -     Pneumococcal conjugate vaccine 20-valent  Elevated liver enzymes Assessment & Plan: Fatty liver noted on US , recommend dietary modifications  Orders: -     COMPLETE METABOLIC PANEL WITHOUT GFR; Future -     Pneumococcal conjugate vaccine 20-valent  Essential hypertension Assessment & Plan: Blood pressure well controlled, goal bp <140/90 Continue current medications and dietary modifications follow metabolic panel  Orders: -     Pneumococcal conjugate vaccine 20-valent  Gastroesophageal reflux disease without esophagitis Assessment & Plan: Controlled on pepcid  PRN with dietary modifications  Orders: -     Pneumococcal conjugate vaccine 20-valent  Benign  prostatic hyperplasia without lower urinary tract symptoms Assessment & Plan: Symptoms stable, followed by urology  Orders: -     Pneumococcal conjugate vaccine 20-valent  Osteoarthritis of multiple joints, unspecified osteoarthritis type -     Pneumococcal conjugate vaccine 20-valent  Other  orders -     Ondansetron  HCl; Take 1 tablet (4 mg total) by mouth every 8 (eight) hours as needed for nausea or vomiting.  Dispense: 20 tablet; Refill: 0     Return in about 3 months (around 01/20/2024) for labs prior to visit, routine follow up.  Zahid Carneiro K. Denney [facility] [phone]

## 2023-10-20 NOTE — Assessment & Plan Note (Signed)
 Continues on lipitor with dietary modifications

## 2023-10-20 NOTE — Assessment & Plan Note (Signed)
 Blood pressure well controlled, goal bp <140/90 Continue current medications and dietary modifications follow metabolic panel

## 2023-10-20 NOTE — Assessment & Plan Note (Signed)
 Fatty liver noted on US , recommend dietary modifications

## 2023-10-26 ENCOUNTER — Other Ambulatory Visit: Payer: Self-pay | Admitting: Nurse Practitioner

## 2023-10-26 DIAGNOSIS — E1169 Type 2 diabetes mellitus with other specified complication: Secondary | ICD-10-CM

## 2023-11-10 ENCOUNTER — Other Ambulatory Visit: Payer: Self-pay | Admitting: Nurse Practitioner

## 2023-11-10 DIAGNOSIS — E785 Hyperlipidemia, unspecified: Secondary | ICD-10-CM

## 2023-11-17 ENCOUNTER — Other Ambulatory Visit: Payer: Self-pay | Admitting: Nurse Practitioner

## 2023-11-17 ENCOUNTER — Ambulatory Visit
Admission: RE | Admit: 2023-11-17 | Discharge: 2023-11-17 | Disposition: A | Discharge status: Home / Self Care | Source: Ambulatory Visit | Attending: Family Medicine | Admitting: Family Medicine

## 2023-11-17 VITALS — BP 127/86 | HR 101 | Temp 98.4°F | Resp 17

## 2023-11-17 DIAGNOSIS — J019 Acute sinusitis, unspecified: Secondary | ICD-10-CM

## 2023-11-17 DIAGNOSIS — I10 Essential (primary) hypertension: Secondary | ICD-10-CM

## 2023-11-17 DIAGNOSIS — B9689 Other specified bacterial agents as the cause of diseases classified elsewhere: Secondary | ICD-10-CM | POA: Diagnosis not present

## 2023-11-17 MED ORDER — AMOXICILLIN-POT CLAVULANATE 875-125 MG PO TABS: 1.0000 | ORAL_TABLET | Freq: Two times a day (BID) | ORAL | 0 refills | Status: DC

## 2023-11-17 MED ORDER — PREDNISONE 20 MG PO TABS: 40.0000 mg | ORAL_TABLET | Freq: Every day | ORAL | 0 refills | Status: DC

## 2023-11-17 NOTE — ED Triage Notes (Signed)
 Pt c/o cough and nasal, head and chest congestion x 5 days. Some chills last night. Taking dayquil/nyquil prn. Requesting work note as well.

## 2023-11-17 NOTE — Discharge Instructions (Signed)
 Make sure you drink lots of water Take the antibiotic Augmentin  2 times a day for a week Take prednisone  once a day for 5 days. Prednisone  can slightly raise your blood sugar so be careful what you eat See your doctor if not improving by next week

## 2023-11-17 NOTE — ED Provider Notes (Signed)
 [facility]    CSN: [id number] Arrival date & time: 11/17/23  1114      History   Chief Complaint Chief Complaint  Patient presents with   Nasal Congestion    Have had congestion in head, chest and sinuses for past 5 days.    HPI Kyle Mcclure is a 63 y.o. male.   Patient states that he had allergy symptoms and a mild cold last week.  He thought it was getting better and then it suddenly became worse.  For the last 5 days he has had increased sinus congestion, headache, postnasal drip, and cough.  He has yellow sinus drainage with some blood streaks.  No shortness of breath.  No chest pain.  He is not usually prone to sinus infections.  He does have seasonal allergies.  Patient has diabetes hypertension hyperlipidemia well-controlled.  GERD.  He sees his doctor regularly    Past Medical History:  Diagnosis Date   Fatty liver    followed by dr Howard Macho--- mild elevated LFT since 2015,  blood work-up done 05/ 2020 (epic), and abd. ultrasound 10-16-2018 (epic),  recommendation loss weight   History of kidney stones    Hyperlipidemia    Hypertension    Seasonal allergies    Type 2 diabetes mellitus (HCC)    followed by pcp--  fasting cbg-- 140;  had been checking blood surgar past few weeks   Ureteral calculi    right   Wears contact lenses     Patient Active Problem List   Diagnosis Date Noted   Elevated liver enzymes 10/20/2023   Gastroesophageal reflux disease without esophagitis 10/20/2023   Osteoarthritis of multiple joints 10/20/2023   Type 2 diabetes mellitus with obesity (HCC) 04/05/2014   Hyperlipidemia LDL goal <70 12/21/2013   Essential hypertension 06/15/2013   BPH (benign prostatic hyperplasia) 06/15/2013   Allergic rhinitis 06/15/2013    Past Surgical History:  Procedure Laterality Date   COLONOSCOPY WITH PROPOFOL   03-13-2015  dr Howard Macho   CYSTOSCOPY WITH HOLMIUM LASER LITHOTRIPSY  12-23-2003   dr Grady Lawman  @[facility]     CYSTOSCOPY/RETROGRADE/URETEROSCOPY/STONE EXTRACTION WITH BASKET Right 03/26/2019   Procedure: CYSTOSCOPY/RETROGRADE/URETEROSCOPY/STONE EXTRACTION WITH BASKET/ STENT PLACEMENT;  Surgeon: Ottelin, Mark, MD;  Location: [facility];  Service: Urology;  Laterality: Right;   EXTRACORPOREAL SHOCK WAVE LITHOTRIPSY  11-05-2012   @[facility]    TONSILLECTOMY  child       Home Medications    Prior to Admission medications   Medication Sig Start Date End Date Taking? Authorizing Provider  amoxicillin -clavulanate (AUGMENTIN ) 875-125 MG tablet Take 1 tablet by mouth every 12 (twelve) hours. 11/17/23  Yes Stephany Ehrich, MD  predniSONE  (DELTASONE ) 20 MG tablet Take 2 tablets (40 mg total) by mouth daily with breakfast. 11/17/23  Yes Stephany Ehrich, MD  aspirin 81 MG tablet Take 81 mg by mouth daily.    [provider]  atorvastatin  (LIPITOR) 20 MG tablet TAKE 1 TABLET DAILY 03/19/23   Eubanks, Jessica K, NP  famotidine  (PEPCID ) 20 MG tablet Take 1 tablet (20 mg total) by mouth 2 (two) times daily as needed for heartburn or indigestion. 04/16/23   Verma Gobble, NP  fenofibrate  (TRICOR ) 145 MG tablet TAKE 1 TABLET DAILY 11/10/23   Eubanks, Jessica K, NP  fluticasone  (FLONASE ) 50 MCG/ACT nasal spray Place 1 spray into both nostrils as needed for allergies or rhinitis. 09/28/18   Verma Gobble, NP  glucose blood (ONETOUCH VERIO) test strip Use to test  blood sugar twice daily Dx: E11.69 09/28/18   Eubanks, Jessica K, NP  ibuprofen  (ADVIL ) 200 MG tablet Take 200 mg by mouth every 6 (six) hours as needed.    [provider]  JARDIANCE  25 MG TABS tablet TAKE 1 TABLET DAILY 05/05/23   Eubanks, Jessica K, NP  Lancet Devices (ONE TOUCH DELICA LANCING DEV) MISC Check blood sugar 2 x daily as directed DX: 250.02 09/28/18   Eubanks, Jessica K, NP  lisinopril  (ZESTRIL ) 10 MG tablet TAKE 1 TABLET DAILY 11/17/23   Eubanks, Jessica K, NP  metFORMIN  (GLUCOPHAGE ) 1000 MG tablet TAKE 1 TABLET  DAILY WITH BREAKFAST 10/28/23   Eubanks, Jessica K, NP  naproxen sodium (ALEVE) 220 MG tablet Take 220 mg by mouth as needed.    [provider]  ondansetron  (ZOFRAN ) 4 MG tablet Take 1 tablet (4 mg total) by mouth every 8 (eight) hours as needed for nausea or vomiting. 10/20/23   Eubanks, Jessica K, NP  pioglitazone  (ACTOS ) 15 MG tablet TAKE 1 TABLET DAILY 05/26/23   Eubanks, Jessica K, NP  potassium citrate  (UROCIT-K ) 10 MEQ (1080 MG) SR tablet at bedtime. 2 by mouth once daily to prevent kidney stones 09/21/13   Burnette Carte, MD  tirzepatide  (MOUNJARO ) 7.5 MG/0.5ML Pen Inject 7.5 mg into the skin once a week. 10/20/23   Verma Gobble, NP    Family History Family History  Problem Relation Age of Onset   Cancer Mother    Cataracts Father    Colon cancer Neg Hx    Rectal cancer Neg Hx    Stomach cancer Neg Hx    Esophageal cancer Neg Hx     Social History Social History   Tobacco Use   Smoking status: Former    Current packs/day: 0.00    Average packs/day: 0.5 packs/day for 3.0 years (1.5 ttl pk-yrs)    Types: Cigarettes    Start date: 06/04/1987    Quit date: 06/03/1990    Years since quitting: 33.4   Smokeless tobacco: Never  Vaping Use   Vaping status: Never Used  Substance Use Topics   Alcohol use: Yes    Alcohol/week: 4.0 standard drinks of alcohol    Types: 4 Standard drinks or equivalent per week    Comment: Beer off/on    Drug use: Never     Allergies   Hydrocodone and Oxycodone   Review of Systems Review of Systems  See HPI Physical Exam Triage Vital Signs ED Triage Vitals  Encounter Vitals Group     BP 11/17/23 1119 127/86     Girls Systolic BP Percentile --      Girls Diastolic BP Percentile --      Boys Systolic BP Percentile --      Boys Diastolic BP Percentile --      Pulse Rate 11/17/23 1119 (!) 101     Resp 11/17/23 1119 17     Temp 11/17/23 1119 98.4 F (36.9 C)     Temp Source 11/17/23 1119 Oral     SpO2 11/17/23 1119 96 %      Weight --      Height --      Head Circumference --      Peak Flow --      Pain Score 11/17/23 1120 0     Pain Loc --      Pain Education --      Exclude from Growth Chart --    No data found.  Updated Vital  Signs BP 127/86 (BP Location: Right Arm)   Pulse (!) 101   Temp 98.4 F (36.9 C) (Oral)   Resp 17   SpO2 96%   Physical Exam Constitutional:      General: He is not in acute distress.    Appearance: He is well-developed.     Comments: Appears uncomfortable.  Overweight  HENT:     Head: Normocephalic and atraumatic.     Right Ear: Tympanic membrane and ear canal normal.     Left Ear: Tympanic membrane and ear canal normal.     Ears:     Comments: Facial sinuses are tender maxillary ethmoid and frontal    Nose: Congestion and rhinorrhea present.     Mouth/Throat:     Pharynx: Posterior oropharyngeal erythema present.   Eyes:     Conjunctiva/sclera: Conjunctivae normal.     Pupils: Pupils are equal, round, and reactive to light.    Cardiovascular:     Rate and Rhythm: Normal rate and regular rhythm.     Heart sounds: Normal heart sounds.  Pulmonary:     Effort: Pulmonary effort is normal. No respiratory distress.     Breath sounds: Normal breath sounds.   Musculoskeletal:        General: Normal range of motion.     Cervical back: Normal range of motion.  Lymphadenopathy:     Cervical: Cervical adenopathy present.   Skin:    General: Skin is warm and dry.   Neurological:     Mental Status: He is alert.      UC Treatments / Results  Labs (all labs ordered are listed, but only abnormal results are displayed) Labs Reviewed - No data to display  EKG   Radiology No results found.  Procedures Procedures (including critical care time)  Medications Ordered in UC Medications - No data to display  Initial Impression / Assessment and Plan / UC Course  I have reviewed the triage vital signs and the nursing notes.  Pertinent labs & imaging results  that were available during my care of the patient were reviewed by me and considered in my medical decision making (see chart for details).     Patient's had sinus symptoms for over a week.  With his worsening of symptoms we will treat for bacterial sinusitis. Final Clinical Impressions(s) / UC Diagnoses   Final diagnoses:  Acute bacterial sinusitis     Discharge Instructions      Make sure you drink lots of water Take the antibiotic Augmentin  2 times a day for a week Take prednisone  once a day for 5 days. Prednisone  can slightly raise your blood sugar so be careful what you eat See your doctor if not improving by next week   ED Prescriptions     Medication Sig Dispense Auth. Provider   amoxicillin -clavulanate (AUGMENTIN ) 875-125 MG tablet Take 1 tablet by mouth every 12 (twelve) hours. 14 tablet Stephany Ehrich, MD   predniSONE  (DELTASONE ) 20 MG tablet Take 2 tablets (40 mg total) by mouth daily with breakfast. 10 tablet Stephany Ehrich, MD      PDMP not reviewed this encounter.   Stephany Ehrich, MD 11/17/23 [phone]

## 2023-12-30 ENCOUNTER — Encounter: Payer: Self-pay | Admitting: Nurse Practitioner

## 2023-12-30 DIAGNOSIS — E1169 Type 2 diabetes mellitus with other specified complication: Secondary | ICD-10-CM

## 2023-12-30 MED ORDER — TIRZEPATIDE 7.5 MG/0.5ML ~~LOC~~ SOAJ: 7.5000 mg | SUBCUTANEOUS | 3 refills | Status: DC

## 2023-12-30 NOTE — Telephone Encounter (Signed)
 Rx pended x 1 year supply, will have provider confirm ok to approve

## 2024-01-23 ENCOUNTER — Other Ambulatory Visit

## 2024-01-23 DIAGNOSIS — E1169 Type 2 diabetes mellitus with other specified complication: Secondary | ICD-10-CM

## 2024-01-23 DIAGNOSIS — R748 Abnormal levels of other serum enzymes: Secondary | ICD-10-CM

## 2024-01-24 LAB — COMPREHENSIVE METABOLIC PANEL WITH GFR
AG Ratio: 1.8 (calc) (ref 1.0–2.5)
ALT: 48 U/L — ABNORMAL HIGH (ref 9–46)
AST: 41 U/L — ABNORMAL HIGH (ref 10–35)
Albumin: 4.8 g/dL (ref 3.6–5.1)
Alkaline phosphatase (APISO): 72 U/L (ref 35–144)
BUN: 19 mg/dL (ref 7–25)
CO2: 25 mmol/L (ref 20–32)
Calcium: 10.4 mg/dL — ABNORMAL HIGH (ref 8.6–10.3)
Chloride: 105 mmol/L (ref 98–110)
Creat: 1.08 mg/dL (ref 0.70–1.35)
Globulin: 2.6 g/dL (ref 1.9–3.7)
Glucose, Bld: 128 mg/dL — ABNORMAL HIGH (ref 65–99)
Potassium: 4.6 mmol/L (ref 3.5–5.3)
Sodium: 141 mmol/L (ref 135–146)
Total Bilirubin: 0.5 mg/dL (ref 0.2–1.2)
Total Protein: 7.4 g/dL (ref 6.1–8.1)
eGFR: 78 mL/min/1.73m2 (ref >=60)

## 2024-01-24 LAB — HEMOGLOBIN A1C
Hgb A1c MFr Bld: 7.3 % — ABNORMAL HIGH (ref <5.7)
Mean Plasma Glucose: 163 mg/dL
eAG (mmol/L): 9 mmol/L

## 2024-01-26 ENCOUNTER — Ambulatory Visit: Admitting: Nurse Practitioner

## 2024-01-26 ENCOUNTER — Encounter: Payer: Self-pay | Admitting: Nurse Practitioner

## 2024-01-26 VITALS — BP 118/76 | HR 73 | Temp 97.7°F | Ht 68.0 in | Wt 219.0 lb

## 2024-01-26 DIAGNOSIS — E669 Obesity, unspecified: Secondary | ICD-10-CM

## 2024-01-26 DIAGNOSIS — E66811 Obesity, class 1: Secondary | ICD-10-CM

## 2024-01-26 DIAGNOSIS — R748 Abnormal levels of other serum enzymes: Secondary | ICD-10-CM

## 2024-01-26 DIAGNOSIS — M25511 Pain in right shoulder: Secondary | ICD-10-CM | POA: Diagnosis not present

## 2024-01-26 DIAGNOSIS — I10 Essential (primary) hypertension: Secondary | ICD-10-CM | POA: Diagnosis not present

## 2024-01-26 DIAGNOSIS — E661 Drug-induced obesity: Secondary | ICD-10-CM | POA: Insufficient documentation

## 2024-01-26 DIAGNOSIS — E1169 Type 2 diabetes mellitus with other specified complication: Secondary | ICD-10-CM

## 2024-01-26 DIAGNOSIS — Z6834 Body mass index (BMI) 34.0-34.9, adult: Secondary | ICD-10-CM

## 2024-01-26 DIAGNOSIS — G8929 Other chronic pain: Secondary | ICD-10-CM

## 2024-01-26 DIAGNOSIS — K219 Gastro-esophageal reflux disease without esophagitis: Secondary | ICD-10-CM

## 2024-01-26 MED ORDER — MELOXICAM 15 MG PO TABS: 15.0000 mg | ORAL_TABLET | Freq: Every day | ORAL | 3 refills | Status: AC | PRN

## 2024-01-26 NOTE — Progress Notes (Signed)
 Careteam: Patient Care Team: Caro Harlene POUR, NP as PCP - General (Nurse Practitioner) Katheleen Rush, OD as Consulting Physician (Optometry) Ottelin, Mark, MD (Inactive) as Consulting Physician (Urology)  PLACE OF SERVICE:  [facility]  Advanced Directive information    Allergies  Allergen Reactions   Hydrocodone Nausea And Vomiting   Oxycodone Nausea And Vomiting    Chief Complaint  Patient presents with   Medical Management of Chronic Issues    3 month routine follow up  Patient has pain in right shoulder / has been persistent since last visit     HPI:  Discussed the use of AI scribe software for clinical note transcription with the patient, who gave verbal consent to proceed.  History of Present Illness Kyle Mcclure is a 63 year old male who presents for a six-month follow-up and evaluation of left shoulder pain.  He has been experiencing left shoulder pain described as 'like a toothache' for the past three months, coinciding with a deck-building project. The pain worsens with physical activity such as lifting and moving beams. He uses a heating pad for relief but has not been icing the shoulder. He takes ibuprofen  and Aleve, particularly on days with anticipated heavy physical activity, to manage the pain.  He is currently on Mounjaro , which was recently increased to 7.5 mg, and has been using his wife's supply since the last visit. He wants to modify his diet by reducing milkshake consumption and checking the sugar content of his protein shakes. He is concerned about his A1c and calcium  levels being high, which he attributes to consuming a milkshake with regular milk every morning. He occasionally eats bagels and keeps beef jerky at his desk for protein, which sometimes causes indigestion.  No chest pain, palpitations, changes in bowel movements, or urinary frequency. He reports occasional indigestion related to beef jerky consumption. His mother has passed, which  affects his holiday indulgence habits.    Review of Systems:  Review of Systems  Constitutional:  Negative for chills, fever and weight loss.  HENT:  Negative for tinnitus.   Respiratory:  Negative for cough, sputum production and shortness of breath.   Cardiovascular:  Negative for chest pain, palpitations and leg swelling.  Gastrointestinal:  Negative for abdominal pain, constipation, diarrhea and heartburn.  Genitourinary:  Negative for dysuria, frequency and urgency.  Musculoskeletal:  Positive for joint pain. Negative for back pain, falls and myalgias.  Skin: Negative.   Neurological:  Negative for dizziness and headaches.  Psychiatric/Behavioral:  Negative for depression and memory loss. The patient does not have insomnia.     Past Medical History:  Diagnosis Date   Fatty liver    followed by dr teressa--- mild elevated LFT since 2015,  blood work-up done 05/ 2020 (epic), and abd. ultrasound 10-16-2018 (epic),  recommendation loss weight   History of kidney stones    Hyperlipidemia    Hypertension    Seasonal allergies    Type 2 diabetes mellitus (HCC)    followed by pcp--  fasting cbg-- 140;  had been checking blood surgar past few weeks   Ureteral calculi    right   Wears contact lenses    Past Surgical History:  Procedure Laterality Date   COLONOSCOPY WITH PROPOFOL   03-13-2015  dr teressa   CYSTOSCOPY WITH HOLMIUM LASER LITHOTRIPSY  12-23-2003   dr ceil  @[facility]    CYSTOSCOPY/RETROGRADE/URETEROSCOPY/STONE EXTRACTION WITH BASKET Right 03/26/2019   Procedure: CYSTOSCOPY/RETROGRADE/URETEROSCOPY/STONE EXTRACTION WITH BASKET/ STENT PLACEMENT;  Surgeon: Ottelin,  Oneil, MD;  Location: [facility];  Service: Urology;  Laterality: Right;   EXTRACORPOREAL SHOCK WAVE LITHOTRIPSY  11-05-2012   @[facility]    TONSILLECTOMY  child   Social History:   reports that he quit smoking about 33 years ago. His smoking use included cigarettes. He started smoking about 36 years ago. He  has a 1.5 pack-year smoking history. He has never used smokeless tobacco. He reports current alcohol use of about 4.0 standard drinks of alcohol per week. He reports that he does not use drugs.  Family History  Problem Relation Age of Onset   Cancer Mother    Cataracts Father    Colon cancer Neg Hx    Rectal cancer Neg Hx    Stomach cancer Neg Hx    Esophageal cancer Neg Hx     Medications: Patient's Medications  New Prescriptions   No medications on file  Previous Medications   AMOXICILLIN -CLAVULANATE (AUGMENTIN ) 875-125 MG TABLET    Take 1 tablet by mouth every 12 (twelve) hours.   ASPIRIN 81 MG TABLET    Take 81 mg by mouth daily.   ATORVASTATIN  (LIPITOR) 20 MG TABLET    TAKE 1 TABLET DAILY   FAMOTIDINE  (PEPCID ) 20 MG TABLET    Take 1 tablet (20 mg total) by mouth 2 (two) times daily as needed for heartburn or indigestion.   FENOFIBRATE  (TRICOR ) 145 MG TABLET    TAKE 1 TABLET DAILY   FLUTICASONE  (FLONASE ) 50 MCG/ACT NASAL SPRAY    Place 1 spray into both nostrils as needed for allergies or rhinitis.   GLUCOSE BLOOD (ONETOUCH VERIO) TEST STRIP    Use to test blood sugar twice daily Dx: E11.69   IBUPROFEN  (ADVIL ) 200 MG TABLET    Take 200 mg by mouth every 6 (six) hours as needed.   JARDIANCE  25 MG TABS TABLET    TAKE 1 TABLET DAILY   LANCET DEVICES (ONE TOUCH DELICA LANCING DEV) MISC    Check blood sugar 2 x daily as directed DX: 250.02   LISINOPRIL  (ZESTRIL ) 10 MG TABLET    TAKE 1 TABLET DAILY   METFORMIN  (GLUCOPHAGE ) 1000 MG TABLET    TAKE 1 TABLET DAILY WITH BREAKFAST   NAPROXEN SODIUM (ALEVE) 220 MG TABLET    Take 220 mg by mouth as needed.   ONDANSETRON  (ZOFRAN ) 4 MG TABLET    Take 1 tablet (4 mg total) by mouth every 8 (eight) hours as needed for nausea or vomiting.   PIOGLITAZONE  (ACTOS ) 15 MG TABLET    TAKE 1 TABLET DAILY   POTASSIUM CITRATE  (UROCIT-K ) 10 MEQ (1080 MG) SR TABLET    at bedtime. 2 by mouth once daily to prevent kidney stones   PREDNISONE  (DELTASONE ) 20 MG  TABLET    Take 2 tablets (40 mg total) by mouth daily with breakfast.   TIRZEPATIDE  (MOUNJARO ) 7.5 MG/0.5ML PEN    Inject 7.5 mg into the skin once a week.  Modified Medications   No medications on file  Discontinued Medications   No medications on file    Physical Exam:  Vitals:   01/26/24 0826  BP: 118/76  Pulse: 73  Temp: 97.7 F (36.5 C)  SpO2: 93%  Weight: 219 lb (99.3 kg)  Height: 5' 8 (1.727 m)   Body mass index is 33.3 kg/m. Wt Readings from Last 3 Encounters:  01/26/24 219 lb (99.3 kg)  10/17/23 228 lb (103.4 kg)  04/16/23 222 lb 9.6 oz (101 kg)    Physical Exam  Constitutional:      General: He is not in acute distress.    Appearance: He is well-developed. He is not diaphoretic.  HENT:     Head: Normocephalic and atraumatic.     Right Ear: External ear normal.     Left Ear: External ear normal.     Mouth/Throat:     Pharynx: No oropharyngeal exudate.  Eyes:     Conjunctiva/sclera: Conjunctivae normal.     Pupils: Pupils are equal, round, and reactive to light.  Cardiovascular:     Rate and Rhythm: Normal rate and regular rhythm.     Heart sounds: Normal heart sounds.  Pulmonary:     Effort: Pulmonary effort is normal.     Breath sounds: Normal breath sounds.  Abdominal:     General: Bowel sounds are normal.     Palpations: Abdomen is soft.  Musculoskeletal:        General: No tenderness.     Cervical back: Normal range of motion and neck supple.     Right lower leg: No edema.     Left lower leg: No edema.  Skin:    General: Skin is warm and dry.  Neurological:     Mental Status: He is alert and oriented to person, place, and time.     Labs reviewed: Basic Metabolic Panel: Recent Labs    04/14/23 0843 05/05/23 0856 10/16/23 0816 01/23/24 0808  NA 140  --  138 141  K 4.9  --  4.8 4.6  CL 103  --  103 105  CO2 27  --  26 25  GLUCOSE 120*  --  136* 128*  BUN 20  --  22 19  CREATININE 1.00  --  1.10 1.08  CALCIUM  11.1* 9.4 10.2 10.4*    Liver Function Tests: Recent Labs    04/14/23 0843 10/16/23 0816 01/23/24 0808  AST 38* 51* 41*  ALT 43 68* 48*  BILITOT 0.8 0.7 0.5  PROT 7.0 7.2 7.4   No results for input(s): LIPASE, AMYLASE in the last 8760 hours. No results for input(s): AMMONIA in the last 8760 hours. CBC: Recent Labs    04/14/23 0843 10/16/23 0816  WBC 7.1 7.2  NEUTROABS 4,459 4,536  HGB 15.2 15.9  HCT 46.1 47.7  MCV 91.1 91.6  PLT 262 267   Lipid Panel: Recent Labs    04/14/23 0843 10/16/23 0816  CHOL 128 139  HDL 40 39*  LDLCALC 65 74  TRIG 151* 164*  CHOLHDL 3.2 3.6   TSH: No results for input(s): TSH in the last 8760 hours. A1C: Lab Results  Component Value Date   HGBA1C 7.3 (H) 01/23/2024     Assessment/Plan  Chronic right shoulder pain  Exacerbated by working on his deck, reports almost done with project- educated him to stop movement that worsened pain Ice TID -     Meloxicam ; Take 1 tablet (15 mg total) by mouth daily as needed for pain.  Dispense: 30 tablet; Refill: 3  Type 2 diabetes mellitus with obesity (HCC) Assessment & Plan: Improved but not at goal Has diet changes he is willing to make for better control.  Encouraged dietary compliance with med management, routine foot care/monitoring and to keep up with diabetic eye exams through ophthalmology    Orders: -     Hemoglobin A1c; Future  Essential hypertension Assessment & Plan: Blood pressure well controlled, goal bp <140/90 Continue current medications and dietary modifications follow metabolic panel  Orders: -  Comprehensive metabolic panel with GFR; Future -     CBC with Differential/Platelet; Future  Elevated liver enzymes Assessment & Plan: Low fat diet encouraged  Continue to monitor   Orders: -     Comprehensive metabolic panel with GFR; Future  Gastroesophageal reflux disease without esophagitis Assessment & Plan: Stable on current regimen, encouraged dietary  modifications   Class 1 drug-induced obesity with serious comorbidity and body mass index (BMI) of 34.0 to 34.9 in adult Assessment & Plan: -has had positive weight loss, continued to provide education on healthy weight loss through increase in physical activity and proper nutrition  -continues on mounjaro        Return in about 3 months (around 04/27/2024) for routine follow up, labs prior to visit.:  Libi Corso K. Caro BODILY [facility] [phone]

## 2024-01-26 NOTE — Assessment & Plan Note (Signed)
-  has had positive weight loss, continued to provide education on healthy weight loss through increase in physical activity and proper nutrition  -continues on mounjaro

## 2024-01-26 NOTE — Assessment & Plan Note (Signed)
 Stable on current regimen, encouraged dietary modifications

## 2024-01-26 NOTE — Assessment & Plan Note (Signed)
 Improved but not at goal Has diet changes he is willing to make for better control.  Encouraged dietary compliance with med management, routine foot care/monitoring and to keep up with diabetic eye exams through ophthalmology

## 2024-01-26 NOTE — Patient Instructions (Signed)
 Do not take advil  or aleve with mobic  Avoid taking any NSAIDS together (mobic , Aleve, Advil , Motrin , Ibuprofen )   Can use tylenol  in addition to above medication

## 2024-01-26 NOTE — Assessment & Plan Note (Signed)
 Low fat diet encouraged  Continue to monitor

## 2024-01-26 NOTE — Assessment & Plan Note (Signed)
 Blood pressure well controlled, goal bp <140/90 Continue current medications and dietary modifications follow metabolic panel

## 2024-03-15 ENCOUNTER — Other Ambulatory Visit: Payer: Self-pay | Admitting: Nurse Practitioner

## 2024-03-15 DIAGNOSIS — E785 Hyperlipidemia, unspecified: Secondary | ICD-10-CM

## 2024-03-15 DIAGNOSIS — R748 Abnormal levels of other serum enzymes: Secondary | ICD-10-CM

## 2024-03-17 ENCOUNTER — Other Ambulatory Visit: Payer: Self-pay | Admitting: Nurse Practitioner

## 2024-03-17 DIAGNOSIS — K219 Gastro-esophageal reflux disease without esophagitis: Secondary | ICD-10-CM

## 2024-04-30 ENCOUNTER — Other Ambulatory Visit: Payer: Self-pay | Admitting: Nurse Practitioner

## 2024-04-30 DIAGNOSIS — E119 Type 2 diabetes mellitus without complications: Secondary | ICD-10-CM

## 2024-05-03 ENCOUNTER — Other Ambulatory Visit: Payer: Self-pay

## 2024-05-05 ENCOUNTER — Other Ambulatory Visit: Payer: Self-pay | Admitting: Nurse Practitioner

## 2024-05-05 DIAGNOSIS — E785 Hyperlipidemia, unspecified: Secondary | ICD-10-CM

## 2024-05-07 ENCOUNTER — Ambulatory Visit: Payer: Self-pay | Admitting: Nurse Practitioner

## 2024-05-17 ENCOUNTER — Other Ambulatory Visit: Payer: Self-pay | Admitting: Nurse Practitioner

## 2024-05-17 DIAGNOSIS — E669 Obesity, unspecified: Secondary | ICD-10-CM

## 2024-05-17 DIAGNOSIS — I10 Essential (primary) hypertension: Secondary | ICD-10-CM

## 2024-05-20 ENCOUNTER — Other Ambulatory Visit

## 2024-05-20 DIAGNOSIS — E669 Obesity, unspecified: Secondary | ICD-10-CM

## 2024-05-20 DIAGNOSIS — R748 Abnormal levels of other serum enzymes: Secondary | ICD-10-CM

## 2024-05-20 DIAGNOSIS — I10 Essential (primary) hypertension: Secondary | ICD-10-CM

## 2024-05-21 LAB — CBC WITH DIFFERENTIAL/PLATELET
Absolute Lymphocytes: 1775 {cells}/uL (ref 850–3900)
Absolute Monocytes: 462 {cells}/uL (ref 200–950)
Basophils Absolute: 92 {cells}/uL (ref 0–200)
Basophils Relative: 1.4 %
Eosinophils Absolute: 310 {cells}/uL (ref 15–500)
Eosinophils Relative: 4.7 %
HCT: 47.6 % (ref 39.4–51.1)
Hemoglobin: 15.9 g/dL (ref 13.2–17.1)
MCH: 30.7 pg (ref 27.0–33.0)
MCHC: 33.4 g/dL (ref 31.6–35.4)
MCV: 91.9 fL (ref 81.4–101.7)
MPV: 9.3 fL (ref 7.5–12.5)
Monocytes Relative: 7 %
Neutro Abs: 3960 {cells}/uL (ref 1500–7800)
Neutrophils Relative %: 60 %
Platelets: 277 Thousand/uL (ref 140–400)
RBC: 5.18 Million/uL (ref 4.20–5.80)
RDW: 12.2 % (ref 11.0–15.0)
Total Lymphocyte: 26.9 %
WBC: 6.6 Thousand/uL (ref 3.8–10.8)

## 2024-05-21 LAB — HEMOGLOBIN A1C
Hgb A1c MFr Bld: 6.7 % — ABNORMAL HIGH
Mean Plasma Glucose: 146 mg/dL
eAG (mmol/L): 8.1 mmol/L

## 2024-05-21 LAB — COMPREHENSIVE METABOLIC PANEL WITH GFR
AG Ratio: 2 (calc) (ref 1.0–2.5)
ALT: 46 U/L (ref 9–46)
AST: 41 U/L — ABNORMAL HIGH (ref 10–35)
Albumin: 4.7 g/dL (ref 3.6–5.1)
Alkaline phosphatase (APISO): 73 U/L (ref 35–144)
BUN: 22 mg/dL (ref 7–25)
CO2: 28 mmol/L (ref 20–32)
Calcium: 9.9 mg/dL (ref 8.6–10.3)
Chloride: 101 mmol/L (ref 98–110)
Creat: 1.03 mg/dL (ref 0.70–1.35)
Globulin: 2.4 g/dL (ref 1.9–3.7)
Glucose, Bld: 137 mg/dL — ABNORMAL HIGH (ref 65–99)
Potassium: 4.6 mmol/L (ref 3.5–5.3)
Sodium: 138 mmol/L (ref 135–146)
Total Bilirubin: 1.1 mg/dL (ref 0.2–1.2)
Total Protein: 7.1 g/dL (ref 6.1–8.1)
eGFR: 82 mL/min/1.73m2

## 2024-05-24 ENCOUNTER — Encounter: Payer: Self-pay | Admitting: Nurse Practitioner

## 2024-05-24 ENCOUNTER — Ambulatory Visit: Admitting: Nurse Practitioner

## 2024-05-24 VITALS — BP 118/78 | HR 64 | Temp 97.5°F | Ht 68.0 in | Wt 222.6 lb

## 2024-05-24 DIAGNOSIS — E785 Hyperlipidemia, unspecified: Secondary | ICD-10-CM

## 2024-05-24 DIAGNOSIS — M159 Polyosteoarthritis, unspecified: Secondary | ICD-10-CM

## 2024-05-24 DIAGNOSIS — E119 Type 2 diabetes mellitus without complications: Secondary | ICD-10-CM

## 2024-05-24 DIAGNOSIS — Z23 Encounter for immunization: Secondary | ICD-10-CM | POA: Diagnosis not present

## 2024-05-24 DIAGNOSIS — Z6833 Body mass index (BMI) 33.0-33.9, adult: Secondary | ICD-10-CM

## 2024-05-24 DIAGNOSIS — E6609 Other obesity due to excess calories: Secondary | ICD-10-CM | POA: Diagnosis not present

## 2024-05-24 DIAGNOSIS — Z7984 Long term (current) use of oral hypoglycemic drugs: Secondary | ICD-10-CM | POA: Diagnosis not present

## 2024-05-24 DIAGNOSIS — E669 Obesity, unspecified: Secondary | ICD-10-CM | POA: Diagnosis not present

## 2024-05-24 DIAGNOSIS — R748 Abnormal levels of other serum enzymes: Secondary | ICD-10-CM

## 2024-05-24 DIAGNOSIS — I10 Essential (primary) hypertension: Secondary | ICD-10-CM

## 2024-05-24 DIAGNOSIS — K219 Gastro-esophageal reflux disease without esophagitis: Secondary | ICD-10-CM

## 2024-05-24 DIAGNOSIS — E66811 Obesity, class 1: Secondary | ICD-10-CM

## 2024-05-24 MED ORDER — TIRZEPATIDE 10 MG/0.5ML ~~LOC~~ SOAJ: 10.0000 mg | SUBCUTANEOUS | 1 refills | Status: AC

## 2024-05-24 MED ORDER — EMPAGLIFLOZIN 25 MG PO TABS: 25.0000 mg | ORAL_TABLET | Freq: Every day | ORAL | 3 refills | Status: AC

## 2024-05-24 NOTE — Progress Notes (Signed)
 "   Careteam: Patient Care Team: Caro Harlene POUR, NP as PCP - General (Nurse Practitioner) Katheleen Rush, OD as Consulting Physician (Optometry) Ceil Anes, MD (Inactive) as Consulting Physician (Urology)  PLACE OF SERVICE:  [facility]  Advanced Directive information    Allergies[1]  Chief Complaint  Patient presents with   Medication Management    HPI:  Discussed the use of AI scribe software for clinical note transcription with the patient, who gave verbal consent to proceed.  History of Present Illness Kyle Mcclure is a 63 year old male with type 2 diabetes mellitus who presents for a three-month follow-up.  He manages his type 2 diabetes mellitus with Mounjaro  7.5 mg, Actos  15 mg, Jardiance  25 mg, and Metformin  1000 mg taken with breakfast. His A1c is 6.7, and he aims to lower it to 6.5. He has good energy and has not experienced any significant issues.  He has a history of elevated liver enzymes due to fatty liver, which have remained stable.   His blood pressure is well controlled.   He takes Lipitor and Tricor  for hyperlipidemia.   GERD- he experiences mild indigestion on Monday mornings after his first injection of the week but otherwise has no issues with indigestion. He takes potassium to prevent kidney stones and reports no issues with kidney stones.  He has a history of obesity with a BMI of 33.85, a decrease from a BMI of 42 since starting his weight loss journey. He mentions gaining a little weight since the last visit but is still down from May. He is working on diet modifications and dislikes buying new clothes due to weight changes.  He has arthritis and takes Mobic  (meloxicam ) as needed. His shoulder still acts up, and he mentions the potential benefits of returning to the gym to help with joint pain.   Review of Systems:  Review of Systems  Constitutional:  Negative for chills, fever and weight loss.  HENT:  Negative for tinnitus.   Respiratory:   Negative for cough, sputum production and shortness of breath.   Cardiovascular:  Negative for chest pain, palpitations and leg swelling.  Gastrointestinal:  Negative for abdominal pain, constipation, diarrhea and heartburn.  Genitourinary:  Negative for dysuria, frequency and urgency.  Musculoskeletal:  Negative for back pain, falls, joint pain and myalgias.  Skin: Negative.   Neurological:  Negative for dizziness and headaches.  Psychiatric/Behavioral:  Negative for depression and memory loss. The patient does not have insomnia.     Past Medical History:  Diagnosis Date   Fatty liver    followed by dr teressa--- mild elevated LFT since 2015,  blood work-up done 05/ 2020 (epic), and abd. ultrasound 10-16-2018 (epic),  recommendation loss weight   History of kidney stones    Hyperlipidemia    Hypertension    Seasonal allergies    Type 2 diabetes mellitus (HCC)    followed by pcp--  fasting cbg-- 140;  had been checking blood surgar past few weeks   Ureteral calculi    right   Wears contact lenses    Past Surgical History:  Procedure Laterality Date   COLONOSCOPY WITH PROPOFOL   03-13-2015  dr teressa   CYSTOSCOPY WITH HOLMIUM LASER LITHOTRIPSY  12-23-2003   dr ottelin  @[facility]    CYSTOSCOPY/RETROGRADE/URETEROSCOPY/STONE EXTRACTION WITH BASKET Right 03/26/2019   Procedure: CYSTOSCOPY/RETROGRADE/URETEROSCOPY/STONE EXTRACTION WITH BASKET/ STENT PLACEMENT;  Surgeon: Ottelin, Mark, MD;  Location: [facility];  Service: Urology;  Laterality: Right;   EXTRACORPOREAL SHOCK WAVE  LITHOTRIPSY  11-05-2012   @[facility]    TONSILLECTOMY  child   Social History:   reports that he quit smoking about 33 years ago. His smoking use included cigarettes. He started smoking about 36 years ago. He has a 1.5 pack-year smoking history. He has never used smokeless tobacco. He reports current alcohol use of about 4.0 standard drinks of alcohol per week. He reports that he does not use drugs.  Family  History  Problem Relation Age of Onset   Cancer Mother    Cataracts Father    Colon cancer Neg Hx    Rectal cancer Neg Hx    Stomach cancer Neg Hx    Esophageal cancer Neg Hx     Medications: Patient's Medications  New Prescriptions   No medications on file  Previous Medications   ASPIRIN 81 MG TABLET    Take 81 mg by mouth daily.   ATORVASTATIN  (LIPITOR) 20 MG TABLET    TAKE 1 TABLET DAILY   FAMOTIDINE  (PEPCID ) 20 MG TABLET    TAKE 1 TABLET TWICE A DAY AS NEEDED FOR HEARTBURN OR INDIGESTION   FENOFIBRATE  (TRICOR ) 145 MG TABLET    TAKE 1 TABLET DAILY   FLUTICASONE  (FLONASE ) 50 MCG/ACT NASAL SPRAY    Place 1 spray into both nostrils as needed for allergies or rhinitis.   GLUCOSE BLOOD (ONETOUCH VERIO) TEST STRIP    Use to test blood sugar twice daily Dx: E11.69   IBUPROFEN  (ADVIL ) 200 MG TABLET    Take 200 mg by mouth every 6 (six) hours as needed.   JARDIANCE  25 MG TABS TABLET    TAKE 1 TABLET DAILY   LANCET DEVICES (ONE TOUCH DELICA LANCING DEV) MISC    Check blood sugar 2 x daily as directed DX: 250.02   LISINOPRIL  (ZESTRIL ) 10 MG TABLET    TAKE 1 TABLET DAILY   MELOXICAM  (MOBIC ) 15 MG TABLET    Take 1 tablet (15 mg total) by mouth daily as needed for pain.   METFORMIN  (GLUCOPHAGE ) 1000 MG TABLET    TAKE 1 TABLET DAILY WITH BREAKFAST   NAPROXEN SODIUM (ALEVE) 220 MG TABLET    Take 220 mg by mouth as needed.   ONDANSETRON  (ZOFRAN ) 4 MG TABLET    Take 1 tablet (4 mg total) by mouth every 8 (eight) hours as needed for nausea or vomiting.   PIOGLITAZONE  (ACTOS ) 15 MG TABLET    TAKE 1 TABLET DAILY   POTASSIUM CITRATE  (UROCIT-K ) 10 MEQ (1080 MG) SR TABLET    at bedtime. 2 by mouth once daily to prevent kidney stones   TIRZEPATIDE  (MOUNJARO ) 7.5 MG/0.5ML PEN    Inject 7.5 mg into the skin once a week.  Modified Medications   No medications on file  Discontinued Medications   No medications on file    Physical Exam:  Vitals:   05/24/24 1515  BP: 118/78  Pulse: 64  Temp: (!) 97.5  F (36.4 C)  SpO2: 97%  Weight: 222 lb 9.6 oz (101 kg)  Height: 5' 8 (1.727 m)   Body mass index is 33.85 kg/m. Wt Readings from Last 3 Encounters:  05/24/24 222 lb 9.6 oz (101 kg)  01/26/24 219 lb (99.3 kg)  10/17/23 228 lb (103.4 kg)    Physical Exam Constitutional:      General: He is not in acute distress.    Appearance: He is well-developed. He is not diaphoretic.  HENT:     Head: Normocephalic and atraumatic.     Right  Ear: External ear normal.     Left Ear: External ear normal.     Mouth/Throat:     Pharynx: No oropharyngeal exudate.  Eyes:     Conjunctiva/sclera: Conjunctivae normal.     Pupils: Pupils are equal, round, and reactive to light.  Cardiovascular:     Rate and Rhythm: Normal rate and regular rhythm.     Heart sounds: Normal heart sounds.  Pulmonary:     Effort: Pulmonary effort is normal.     Breath sounds: Normal breath sounds.  Abdominal:     General: Bowel sounds are normal.     Palpations: Abdomen is soft.  Musculoskeletal:        General: No tenderness.     Cervical back: Normal range of motion and neck supple.     Right lower leg: No edema.     Left lower leg: No edema.  Skin:    General: Skin is warm and dry.  Neurological:     Mental Status: He is alert and oriented to person, place, and time.     Labs reviewed: Basic Metabolic Panel: Recent Labs    10/16/23 0816 01/23/24 0808 05/20/24 0836  NA 138 141 138  K 4.8 4.6 4.6  CL 103 105 101  CO2 26 25 28   GLUCOSE 136* 128* 137*  BUN 22 19 22   CREATININE 1.10 1.08 1.03  CALCIUM  10.2 10.4* 9.9   Liver Function Tests: Recent Labs    10/16/23 0816 01/23/24 0808 05/20/24 0836  AST 51* 41* 41*  ALT 68* 48* 46  BILITOT 0.7 0.5 1.1  PROT 7.2 7.4 7.1   No results for input(s): LIPASE, AMYLASE in the last 8760 hours. No results for input(s): AMMONIA in the last 8760 hours. CBC: Recent Labs    10/16/23 0816 05/20/24 0836  WBC 7.2 6.6  NEUTROABS 4,536 3,960  HGB  15.9 15.9  HCT 47.7 47.6  MCV 91.6 91.9  PLT 267 277   Lipid Panel: Recent Labs    10/16/23 0816  CHOL 139  HDL 39*  LDLCALC 74  TRIG 835*  CHOLHDL 3.6   TSH: No results for input(s): TSH in the last 8760 hours. A1C: Lab Results  Component Value Date   HGBA1C 6.7 (H) 05/20/2024     Assessment/Plan  Assessment & Plan Type 2 diabetes mellitus in patient with obesity Diabetes well-controlled with A1c of 6.7. Weight management progress noted with BMI reduction from 42 to 33.85. - Continue Mounjaro  7.5 mg until supply exhausted, then increase to 10 mg. - Continue Actos  15 mg, Jardiance  25 mg, and metformin  1000 mg. - Consider reducing Actos  if glycemic control remains stable.  Essential hypertension Blood pressure well-controlled. - Continue current antihypertensive regimen.  Hyperlipidemia Managed with Lipitor and Tricor . Cholesterol levels not checked this visit. - Continue Lipitor and Tricor . - Check cholesterol levels at next visit.  Gastroesophageal reflux disease GERD symptoms well-controlled, mild indigestion post-Mounjaro  injection resolves by week's end. - Continue current management for GERD.  Elevated liver enzymes Liver enzymes stable with no worsening. - Continue monitoring liver enzymes.  Class 1 obesity BMI 33.85, decreased from 42 since treatment initiation. - Continue diet modifications and weight management efforts.  Osteoarthritis of multiple joints Weight loss beneficial for joint health. - Encouraged weight loss to alleviate joint stress.   Need for vaccination (Primary) - Flu vaccine HIGH DOSE PF(Fluzone  Trivalent)   Return in about 6 months (around 11/22/2024) for routine follow up, labs prior to visit.  Deshanti Adcox K.  Caro BODILY [facility] [phone]      [1]  Allergies Allergen Reactions   Hydrocodone Nausea And Vomiting   Oxycodone Nausea And Vomiting   "

## 2024-05-25 ENCOUNTER — Ambulatory Visit: Payer: Self-pay | Admitting: Nurse Practitioner

## 2024-05-25 LAB — MICROALBUMIN / CREATININE URINE RATIO
Creatinine, Urine: 26 mg/dL (ref 20–320)
Microalb Creat Ratio: 46 mg/g{creat} — ABNORMAL HIGH
Microalb, Ur: 1.2 mg/dL

## 2024-06-28 ENCOUNTER — Telehealth: Payer: Self-pay | Admitting: Pharmacy Technician

## 2024-06-28 ENCOUNTER — Other Ambulatory Visit (HOSPITAL_COMMUNITY): Payer: Self-pay

## 2024-06-28 NOTE — Telephone Encounter (Signed)
 Pharmacy Patient Advocate Encounter   Received notification from Physician's Office that prior authorization for Mounjaro  10MG /0.5ML auto-injectors  is required/requested.   Insurance verification completed.   The patient is insured through [employer].   Per test claim: Refill too soon. PA is not needed at this time. Medication was filled 06/22/2024. Next eligible fill date is 07/14/2024.

## 2024-11-17 ENCOUNTER — Other Ambulatory Visit

## 2024-11-22 ENCOUNTER — Ambulatory Visit: Admitting: Nurse Practitioner
